# Patient Record
Sex: Female | Born: 1940
Health system: Southern US, Community
[De-identification: ages and names within clinical notes are randomized; demographics above are authoritative.]

## PROBLEM LIST (undated history)

## (undated) DIAGNOSIS — E039 Hypothyroidism, unspecified: Secondary | ICD-10-CM

## (undated) DIAGNOSIS — K219 Gastro-esophageal reflux disease without esophagitis: Secondary | ICD-10-CM

## (undated) DIAGNOSIS — I10 Essential (primary) hypertension: Secondary | ICD-10-CM

## (undated) DIAGNOSIS — E119 Type 2 diabetes mellitus without complications: Secondary | ICD-10-CM

## (undated) DIAGNOSIS — F410 Panic disorder [episodic paroxysmal anxiety] without agoraphobia: Secondary | ICD-10-CM

## (undated) DIAGNOSIS — J189 Pneumonia, unspecified organism: Secondary | ICD-10-CM

## (undated) DIAGNOSIS — T8859XA Other complications of anesthesia, initial encounter: Secondary | ICD-10-CM

## (undated) DIAGNOSIS — I2789 Other specified pulmonary heart diseases: Secondary | ICD-10-CM

## (undated) DIAGNOSIS — E785 Hyperlipidemia, unspecified: Secondary | ICD-10-CM

## (undated) DIAGNOSIS — Z9889 Other specified postprocedural states: Secondary | ICD-10-CM

## (undated) DIAGNOSIS — U071 COVID-19: Secondary | ICD-10-CM

## (undated) DIAGNOSIS — I517 Cardiomegaly: Secondary | ICD-10-CM

## (undated) DIAGNOSIS — R011 Cardiac murmur, unspecified: Secondary | ICD-10-CM

## (undated) DIAGNOSIS — R0600 Dyspnea, unspecified: Secondary | ICD-10-CM

## (undated) DIAGNOSIS — R112 Nausea with vomiting, unspecified: Secondary | ICD-10-CM

## (undated) DIAGNOSIS — I2510 Atherosclerotic heart disease of native coronary artery without angina pectoris: Secondary | ICD-10-CM

## (undated) DIAGNOSIS — K635 Polyp of colon: Secondary | ICD-10-CM

## (undated) DIAGNOSIS — Z87442 Personal history of urinary calculi: Secondary | ICD-10-CM

## (undated) DIAGNOSIS — D649 Anemia, unspecified: Secondary | ICD-10-CM

## (undated) DIAGNOSIS — N289 Disorder of kidney and ureter, unspecified: Secondary | ICD-10-CM

## (undated) DIAGNOSIS — C50919 Malignant neoplasm of unspecified site of unspecified female breast: Secondary | ICD-10-CM

## (undated) DIAGNOSIS — T4145XA Adverse effect of unspecified anesthetic, initial encounter: Secondary | ICD-10-CM

## (undated) DIAGNOSIS — D0510 Intraductal carcinoma in situ of unspecified breast: Secondary | ICD-10-CM

## (undated) HISTORY — PX: MASTECTOMY: SHX3

## (undated) HISTORY — PX: OTHER SURGICAL HISTORY: SHX169

## (undated) HISTORY — DX: Other specified pulmonary heart diseases: I27.89

## (undated) HISTORY — DX: Essential (primary) hypertension: I10

## (undated) HISTORY — DX: Cardiomegaly: I51.7

## (undated) HISTORY — DX: Intraductal carcinoma in situ of unspecified breast: D05.10

## (undated) HISTORY — DX: Atherosclerotic heart disease of native coronary artery without angina pectoris: I25.10

## (undated) HISTORY — DX: Hyperlipidemia, unspecified: E78.5

## (undated) HISTORY — DX: Disorder of kidney and ureter, unspecified: N28.9

## (undated) HISTORY — PX: ABDOMINAL HYSTERECTOMY: SHX81

## (undated) HISTORY — PX: CHOLECYSTECTOMY: SHX55

---

## 1987-05-18 DIAGNOSIS — C50919 Malignant neoplasm of unspecified site of unspecified female breast: Secondary | ICD-10-CM

## 1987-05-18 HISTORY — DX: Malignant neoplasm of unspecified site of unspecified female breast: C50.919

## 2004-03-16 ENCOUNTER — Ambulatory Visit: Payer: Self-pay | Admitting: Internal Medicine

## 2006-11-25 ENCOUNTER — Ambulatory Visit: Payer: Self-pay | Admitting: Cardiovascular Disease

## 2006-11-26 ENCOUNTER — Emergency Department: Payer: Self-pay | Admitting: Emergency Medicine

## 2008-03-01 ENCOUNTER — Ambulatory Visit: Payer: Self-pay | Admitting: Cardiology

## 2008-04-25 ENCOUNTER — Ambulatory Visit: Payer: Self-pay | Admitting: Obstetrics and Gynecology

## 2008-05-02 ENCOUNTER — Ambulatory Visit: Payer: Self-pay | Admitting: Obstetrics and Gynecology

## 2008-06-08 ENCOUNTER — Ambulatory Visit: Payer: Self-pay | Admitting: Gastroenterology

## 2008-06-08 ENCOUNTER — Inpatient Hospital Stay (HOSPITAL_COMMUNITY): Admission: EM | Admit: 2008-06-08 | Discharge: 2008-06-12 | Payer: Self-pay | Admitting: Emergency Medicine

## 2008-06-11 ENCOUNTER — Encounter (INDEPENDENT_AMBULATORY_CARE_PROVIDER_SITE_OTHER): Payer: Self-pay | Admitting: *Deleted

## 2009-02-05 ENCOUNTER — Ambulatory Visit: Payer: Self-pay | Admitting: Gastroenterology

## 2009-03-05 DIAGNOSIS — I2510 Atherosclerotic heart disease of native coronary artery without angina pectoris: Secondary | ICD-10-CM | POA: Insufficient documentation

## 2009-03-05 DIAGNOSIS — I2789 Other specified pulmonary heart diseases: Secondary | ICD-10-CM | POA: Insufficient documentation

## 2009-03-05 DIAGNOSIS — I10 Essential (primary) hypertension: Secondary | ICD-10-CM | POA: Insufficient documentation

## 2009-03-05 DIAGNOSIS — I517 Cardiomegaly: Secondary | ICD-10-CM | POA: Insufficient documentation

## 2009-03-05 DIAGNOSIS — E785 Hyperlipidemia, unspecified: Secondary | ICD-10-CM | POA: Insufficient documentation

## 2010-08-31 LAB — POCT I-STAT, CHEM 8
BUN: 13 mg/dL (ref 6–23)
Calcium, Ion: 1.03 mmol/L — ABNORMAL LOW (ref 1.12–1.32)
Chloride: 107 mEq/L (ref 96–112)
Creatinine, Ser: 0.7 mg/dL (ref 0.4–1.2)
Glucose, Bld: 163 mg/dL — ABNORMAL HIGH (ref 70–99)
HCT: 38 % (ref 36.0–46.0)
Hemoglobin: 12.9 g/dL (ref 12.0–15.0)
Potassium: 3.4 mEq/L — ABNORMAL LOW (ref 3.5–5.1)
Sodium: 143 mEq/L (ref 135–145)
TCO2: 26 mmol/L (ref 0–100)

## 2010-08-31 LAB — HEPATIC FUNCTION PANEL
ALT: 226 U/L — ABNORMAL HIGH (ref 0–35)
ALT: 427 U/L — ABNORMAL HIGH (ref 0–35)
AST: 106 U/L — ABNORMAL HIGH (ref 0–37)
AST: 637 U/L — ABNORMAL HIGH (ref 0–37)
Albumin: 3.2 g/dL — ABNORMAL LOW (ref 3.5–5.2)
Albumin: 3.5 g/dL (ref 3.5–5.2)
Alkaline Phosphatase: 116 U/L (ref 39–117)
Alkaline Phosphatase: 121 U/L — ABNORMAL HIGH (ref 39–117)
Bilirubin, Direct: 0.5 mg/dL — ABNORMAL HIGH (ref 0.0–0.3)
Bilirubin, Direct: 0.8 mg/dL — ABNORMAL HIGH (ref 0.0–0.3)
Indirect Bilirubin: 0.8 mg/dL (ref 0.3–0.9)
Indirect Bilirubin: 1 mg/dL — ABNORMAL HIGH (ref 0.3–0.9)
Total Bilirubin: 1.3 mg/dL — ABNORMAL HIGH (ref 0.3–1.2)
Total Bilirubin: 1.8 mg/dL — ABNORMAL HIGH (ref 0.3–1.2)
Total Protein: 6.1 g/dL (ref 6.0–8.3)
Total Protein: 6.4 g/dL (ref 6.0–8.3)

## 2010-08-31 LAB — CBC
HCT: 34.6 % — ABNORMAL LOW (ref 36.0–46.0)
HCT: 38.1 % (ref 36.0–46.0)
HCT: 38.2 % (ref 36.0–46.0)
Hemoglobin: 11.5 g/dL — ABNORMAL LOW (ref 12.0–15.0)
Hemoglobin: 12.5 g/dL (ref 12.0–15.0)
Hemoglobin: 12.6 g/dL (ref 12.0–15.0)
MCHC: 32.8 g/dL (ref 30.0–36.0)
MCHC: 33.1 g/dL (ref 30.0–36.0)
MCHC: 33.3 g/dL (ref 30.0–36.0)
MCV: 89.5 fL (ref 78.0–100.0)
MCV: 90.4 fL (ref 78.0–100.0)
MCV: 90.5 fL (ref 78.0–100.0)
Platelets: 147 10*3/uL — ABNORMAL LOW (ref 150–400)
Platelets: 175 10*3/uL (ref 150–400)
Platelets: 191 10*3/uL (ref 150–400)
RBC: 3.86 MIL/uL — ABNORMAL LOW (ref 3.87–5.11)
RBC: 4.21 MIL/uL (ref 3.87–5.11)
RBC: 4.22 MIL/uL (ref 3.87–5.11)
RDW: 14.6 % (ref 11.5–15.5)
RDW: 14.9 % (ref 11.5–15.5)
RDW: 14.9 % (ref 11.5–15.5)
WBC: 4.2 10*3/uL (ref 4.0–10.5)
WBC: 6.7 10*3/uL (ref 4.0–10.5)
WBC: 7.1 10*3/uL (ref 4.0–10.5)

## 2010-08-31 LAB — COMPREHENSIVE METABOLIC PANEL
ALT: 194 U/L — ABNORMAL HIGH (ref 0–35)
ALT: 355 U/L — ABNORMAL HIGH (ref 0–35)
ALT: 83 U/L — ABNORMAL HIGH (ref 0–35)
AST: 136 U/L — ABNORMAL HIGH (ref 0–37)
AST: 230 U/L — ABNORMAL HIGH (ref 0–37)
AST: 274 U/L — ABNORMAL HIGH (ref 0–37)
Albumin: 3.1 g/dL — ABNORMAL LOW (ref 3.5–5.2)
Albumin: 3.2 g/dL — ABNORMAL LOW (ref 3.5–5.2)
Albumin: 3.9 g/dL (ref 3.5–5.2)
Alkaline Phosphatase: 116 U/L (ref 39–117)
Alkaline Phosphatase: 129 U/L — ABNORMAL HIGH (ref 39–117)
Alkaline Phosphatase: 135 U/L — ABNORMAL HIGH (ref 39–117)
BUN: 12 mg/dL (ref 6–23)
BUN: 3 mg/dL — ABNORMAL LOW (ref 6–23)
BUN: 4 mg/dL — ABNORMAL LOW (ref 6–23)
CO2: 25 mEq/L (ref 19–32)
CO2: 26 mEq/L (ref 19–32)
CO2: 28 mEq/L (ref 19–32)
Calcium: 8.7 mg/dL (ref 8.4–10.5)
Calcium: 9 mg/dL (ref 8.4–10.5)
Calcium: 9.2 mg/dL (ref 8.4–10.5)
Chloride: 105 mEq/L (ref 96–112)
Chloride: 107 mEq/L (ref 96–112)
Chloride: 107 mEq/L (ref 96–112)
Creatinine, Ser: 0.61 mg/dL (ref 0.4–1.2)
Creatinine, Ser: 0.65 mg/dL (ref 0.4–1.2)
Creatinine, Ser: 0.75 mg/dL (ref 0.4–1.2)
GFR calc Af Amer: >60 mL/min (ref >60)
GFR calc Af Amer: >60 mL/min (ref >60)
GFR calc Af Amer: >60 mL/min (ref >60)
GFR calc non Af Amer: >60 mL/min (ref >60)
GFR calc non Af Amer: >60 mL/min (ref >60)
GFR calc non Af Amer: >60 mL/min (ref >60)
Glucose, Bld: 128 mg/dL — ABNORMAL HIGH (ref 70–99)
Glucose, Bld: 141 mg/dL — ABNORMAL HIGH (ref 70–99)
Glucose, Bld: 171 mg/dL — ABNORMAL HIGH (ref 70–99)
Potassium: 3.4 mEq/L — ABNORMAL LOW (ref 3.5–5.1)
Potassium: 3.9 mEq/L (ref 3.5–5.1)
Potassium: 4.2 mEq/L (ref 3.5–5.1)
Sodium: 140 mEq/L (ref 135–145)
Sodium: 140 mEq/L (ref 135–145)
Sodium: 143 mEq/L (ref 135–145)
Total Bilirubin: 1.3 mg/dL — ABNORMAL HIGH (ref 0.3–1.2)
Total Bilirubin: 1.4 mg/dL — ABNORMAL HIGH (ref 0.3–1.2)
Total Bilirubin: 1.5 mg/dL — ABNORMAL HIGH (ref 0.3–1.2)
Total Protein: 5.9 g/dL — ABNORMAL LOW (ref 6.0–8.3)
Total Protein: 6 g/dL (ref 6.0–8.3)
Total Protein: 6.7 g/dL (ref 6.0–8.3)

## 2010-08-31 LAB — POCT CARDIAC MARKERS
CKMB, poc: <1 ng/mL — ABNORMAL LOW (ref 1.0–8.0)
Myoglobin, poc: 39.6 ng/mL (ref 12–200)
Troponin i, poc: <0.05 ng/mL (ref 0.00–0.09)

## 2010-08-31 LAB — URINALYSIS, ROUTINE W REFLEX MICROSCOPIC
Glucose, UA: NEGATIVE mg/dL
Ketones, ur: NEGATIVE mg/dL
Nitrite: NEGATIVE
Protein, ur: NEGATIVE mg/dL
Specific Gravity, Urine: 1.019 (ref 1.005–1.030)
Urobilinogen, UA: >8 mg/dL — ABNORMAL HIGH (ref 0.0–1.0)
pH: 8 (ref 5.0–8.0)

## 2010-08-31 LAB — CK TOTAL AND CKMB (NOT AT ARMC)
CK, MB: 0.6 ng/mL (ref 0.3–4.0)
Relative Index: INVALID (ref 0.0–2.5)
Total CK: 39 U/L (ref 7–177)

## 2010-08-31 LAB — PROTIME-INR
INR: 1 (ref 0.00–1.49)
Prothrombin Time: 13.6 seconds (ref 11.6–15.2)

## 2010-08-31 LAB — URINE MICROSCOPIC-ADD ON

## 2010-08-31 LAB — APTT: aPTT: 28 seconds (ref 24–37)

## 2010-08-31 LAB — TROPONIN I: Troponin I: 0.02 ng/mL (ref 0.00–0.06)

## 2010-08-31 LAB — LIPASE, BLOOD
Lipase: 15 U/L (ref 11–59)
Lipase: 24 U/L (ref 11–59)

## 2010-08-31 LAB — MAGNESIUM: Magnesium: 1.9 mg/dL (ref 1.5–2.5)

## 2010-09-29 NOTE — Discharge Summary (Signed)
Tammy Cannon, Tammy Cannon NO.:  [id number]   MEDICAL RECORD NO.:  [id number]          PATIENT TYPE:  INP   LOCATION:  5153                         FACILITY:  [facility]   PHYSICIAN:  Alfonse Ras, MD   DATE OF BIRTH:  09-Sep-1940   DATE OF ADMISSION:  06/08/2008  DATE OF DISCHARGE:  06/12/2008                               DISCHARGE SUMMARY   CONSULTANTS:  Rachael Fee, MD with [facility].   CHIEF COMPLAINT AND REASON FOR ADMISSION:  [name] is a 71 year old  female patient who has had right upper quadrant epigastric and right  chest pain radiating to the back, onset for several weeks, episode at 9  on the evening of admission, worst episode ever, was continuous and  unrelenting, so she presented to the ER.  She did not have any nausea,  vomiting, fever, or chills.  In the ER, she was afebrile.  Vital signs  were stable.  Her abdomen was tender in the right upper quadrant, but  mildly.  An ultrasound demonstrated gallbladder sludge and gallbladder  wall thickening 4-5 mm.  UA was normal.  Cardiac enzymes were normal.  Lipase 24.  White cell count 7100.  Transaminases were mildly elevated  at 230 and 83  but the total bilirubin of 1.4.  The patient was admitted  by Dr. Dwain Sarna with the following diagnoses:  1. Mild transaminitis and gallbladder sludge, question      choledocholithiasis.  2. Biliary colic, question subacute cholecystitis.   HOSPITAL COURSE:  The patient was admitted and placed on the general  floor for monitoring IV fluids and serial labs and control of pain.  She  was started on DVT prophylaxis with subcu heparin and symptoms were  treated with Protonix, morphine, and Zofran.   By the morning of first day of admission, the patient's transaminases  had increased further.  So, the patient was placed empirically on IV  Unasyn and an MRCP was ordered.  Also, there was some concern the  patient may have obstructive choledocholithiasis, so GI  consult was  obtained.  Dr. Christella Hartigan evaluated the patient.  At this point, the  patient's AST had increased to 637, ALT 427, alkaline phosphatase 121,  and total bilirubin 1.8.  Pancreatic isoenzymes have remained normal.  MRCP subsequently showed no evidence of choledocholithiasis.  The  patient's LFTs continued to trend downward and by June 10, 2008, it  was felt that the patient could probably proceed with a laparoscopic  cholecystectomy in the next 24 hours.   On June 11, 2008, the patient was taken to the OR by Dr. Colin Benton where  she underwent laparoscopic cholecystectomy with a normal intraoperative  cholangiogram for a diagnosis of cholecystitis, biliary colic, and  biliary sludge.  The patient tolerated the procedure well and was sent  back to the general floor to recover.  On postop day #1, the patient's  vital signs were stable.  She was afebrile.  She was tolerating clear  liquids.  Upon initial morning rounds, she had not been started on oral  pain medications because she  had not advanced her diet yet.  She was  complaining of no BMs since admission but has history of constipation at  home.  She was also complaining of continued right epigastrium and right  upper quadrant pain, although her incisional pain was much greater.  Plans at this point were to advance the patient's diet, began on oral  pain medications once diet advanced to full liquids, began IV Toradol  with plans to transition over to ibuprofen after discharge.  The patient  reports she has tolerated this well in the past as she takes with food  or snack and uses a proton pump inhibitor which she is already on.  Because of her continued pain, it was opted to check CMET, CBC, and  lipase before discharge.  If the patient tolerates all the above i.e.  diet advance, pain medications orally, and no abnormalities and labs,  she will be deemed appropriate for discharge home after 3 p.m. today.  Again, her abdomen  exam was essentially benign.  Abdomen is soft with  expected tenderness over the incisions.  The incisions are clean, dry,  and intact and approximated with Steri-Strips.  She has some very mild  tenderness in the epigastrium in right upper quadrant.   FINAL DISCHARGE DIAGNOSES:  1. Abdominal pain secondary to biliary colic and acute cholecystitis.  2. Biliary sludge with recent transaminitis, improving.  3. Status post laparoscopic cholecystectomy with normal intraoperative      cholangiogram.  4. History of chronic constipation.  5. History of hypothyroidism.  6. Dyslipidemia, on statin drug.   DISCHARGE MEDICATIONS:  The patient will resume the following  medications:  1. Protonix 40 mg daily.  2. Levothyroxine 80 mcg daily.  3. Pravastatin 20 mg daily.  4. Tarka 240 mg tablet status daily.   NEW MEDICATIONS:  1. Percocet 5/325 one to two tablets every 4 hours as needed for pain.  2. Ibuprofen 600 mg t.i.d. p.r.n. pain, may take in addition to      Percocet, always take with food or snack.  3. Over-the-counter Colace, Docusate sodium b.i.d. to prevent      constipation.  Stop if loose stools or diarrhea develops.   ACTIVITY:  1. Please refer to the home care instructions for laparoscopic      cholecystectomy provided by [facility].  Again, no      lifting greater than 10 pounds for 2 weeks.  No driving for 1 week.      Okay to shower.  Take bandages off 24 hours after discharge.  2. Call physician if you have redness, drainage, swelling, or new      puncture wounds, temperature greater than 101 degrees Fahrenheit or      severe pain unrelieved by aspirin products or prescribed pain      medicines.   FOLLOWUP:  At the __________ [facility] on February 9,  2:00 p.m.  Arrive at 1:45 p.m. for paperwork, telephone [phone].       Allison L. Rolene Course      Alfonse Ras, MD  Electronically Signed    ALE/MEDQ  D:  06/12/2008  T:   06/12/2008  Job:  [id number]   cc:   Rachael Fee, MD

## 2010-09-29 NOTE — Op Note (Signed)
[name], [name]                 ACCOUNT NO.:  [id number]   MEDICAL RECORD NO.:  [id number]          PATIENT TYPE:  INP   LOCATION:  5153                         FACILITY:  [facility]   PHYSICIAN:  Alfonse Ras, MD   DATE OF BIRTH:  [birth year]   DATE OF PROCEDURE:  06/11/2008  DATE OF DISCHARGE:                               OPERATIVE REPORT   PREOPERATIVE DIAGNOSIS:  Acute cholecystitis.   POSTOPERATIVE DIAGNOSIS:  Acute cholecystitis.   PROCEDURE:  Laparoscopic cholecystectomy with intraoperative  cholangiogram.   FINDINGS:  Normal cholangiogram with some filling defect initially in  the distal common bile duct but cleared with additional contrast and  evidence of acute cholecystitis.   SURGEON:  Alfonse Ras, MD   ASSISTANT:  Lazaro Arms, PA   DESCRIPTION:  After informed consent was granted by the patient, the  patient was taken to the operating room and placed in a supine position.  After adequate general anesthesia was induced using endotracheal tube,  the abdomen was prepped and draped in a normal sterile fashion.  Using a  transverse infraumbilical incision, I dissected down the fascia.  The  fascia was opened vertically.  An 0 Vicryl pursestring suture was placed  on the fascial defect.  Hasson trocar was placed in the abdomen and the  abdomen was insufflated with continuous flow of carbon dioxide.  Pneumoperitoneum was obtained and under direct vision, an 11-mm trocar  was placed in the subxiphoid region, two 5-mm trocars were placed in the  right abdomen under direct vision.  The gallbladder which was  intrahepatic and quite edematous was identified and retracted cephalad.  The neck of the gallbladder was identified and critical view was  obtained of the cystic duct.  It was clipped proximally on the  gallbladder.  A hole was made in the sidewall of the gallbladder because  of the edema and there was a significant amount of sludge extracted.  A  ductotomy  was made and cholangiogram was performed with a Reddick  catheter.  Initially, this showed a small filling defect in the distal  common bile duct but I was able to clear that with additional contrast.  It showed free flow into the right and left hepatic ducts and then into  the duodenum.  The cystic duct was then clipped and divided after the  cholangiocatheter was removed.  Cystic artery was identified, critical  view was obtained, triply clipped and divided.  Gallbladder was taken  off the gallbladder bed using Bovie electrocautery, placed in an  Endocatch bag and removed through the umbilical port.  Adequate  hemostasis was ensured.  Surgicel packing was placed in the gallbladder  fossa.  The gallbladder was removed again through the umbilical port and  pneumoperitoneum was released.  Infraumbilical fascial defect was closed with a 0 Vicryl pursestring  suture.  Skin incisions were closed with subcuticular 4-0 Monocryl and  injected with 0.5 Marcaine.  Steri-Strips and sterile dressings were  applied.  The patient tolerated the procedure well and went to PACU in  good condition.  Alfonse Ras, MD  Electronically Signed     KRE/MEDQ  D:  06/11/2008  T:  06/12/2008  Job:  [id number]

## 2010-09-29 NOTE — Assessment & Plan Note (Signed)
Tammy Cannon OFFICE NOTE   NAME:Tammy Cannon                          MRN:          [id number]  DATE:03/01/2008                            DOB:          [birth year]    HISTORY:  [name] is a delightful 70 year old married white  female, sister of a patient of mine named Tammy Cannon, who comes today  referred by Dr. Gelene Mink for consultation concerning her coronary  disease.   Last year, she was having some chest discomfort.  She underwent a series  of tests including a stress Myoview which showed reduced exercise  tolerance, EF 72%, 1.5 mm of ST-segment depression in V4 through V6, and  no ischemia by perfusion scan.  She subsequently underwent a cardiac  catheterization by Dr. Welton Flakes which showed nonobstructive coronary  disease.  Her EF was 55%.  Renal vessels were angiographically normal.  Her left and right lower extremity vessels were said to be normal.   She also had a 2-D echocardiogram which was normal except for mild LVH.  She also has some mild mitral regurgitation with a mild elevation of  right ventricular systolic pressure at 40 with mild TR.  She had carotid  Dopplers which showed no significant stenosis bilaterally with antegrade  flow in both vertebrals.   Her complaint today is that she has pinpoint discomfort, but is not  exertion related in her neck.  He does not radiate.  She has no  exertional angina.  She has had no syncope, presyncope, tachy  palpitations, orthopnea, or PND.   PAST MEDICAL HISTORY:   ALLERGIES:  She has no known drug allergies.   MEDICATIONS:  She is currently on pantoprazole 40 mg a day, Dulcolax one  on Saturday only, levothyroxine 88 mcg a day, Tarka 2/240 daily,  pravastatin 20 daily, zolpidem 5 mg nightly.  She is not taking aspirin.   Her cardiac risk factors include hypertension, age, and hyperlipidemia.   SURGICAL HISTORY:  Breast cancer years  ago.   FAMILY HISTORY:  Positive for coronary disease in a sister and a  brother.   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  She stands on her  feet a lot.   SOCIAL HISTORY:  She is married and has one child.   PHYSICAL EXAMINATION:  VITAL SIGNS:  Today, blood pressure was 185/100.  Please note she did not take her blood pressure medicine last night when  she normally takes it.  Pulse is 60 and regular.  Weight is 134.1.  She  is 5 feet 3 inches.  GENERAL:  She is a well-developed, well-nourished white female in no  acute distress.  HEENT:  Normal.  NECK:  Carotids are full without bruits.  There is no thyromegaly.  There is no point tenderness.  No lymphadenopathy.  Thyroid is not  enlarged.  Trachea is midline.  Neck is supple.  LUNGS:  Clear to auscultation percussion.  HEART:  Reveals a nondisplaced PMI.  Normal S1 and S2.  No gallop, rub,  or murmur.  ABDOMEN:  Soft.  No pulsatile mass.  No tenderness.  EXTREMITIES:  No cyanosis, clubbing, or edema.  Pulses are present  bilaterally.  There is no sign of DVT.  NEUROLOGIC:  Intact.  SKIN:  Unremarkable.   EKG is normal.   ASSESSMENT:  1. Nonobstructive coronary disease.  2. Hypertension, question control.  She does not check this at home      and unfortunately did not take her medicines last night.  Office      notes from Dr. Timoteo Gaul showed blood pressures that have been fairly      well controlled on several checks there, so it makes me feel a lot      better.  3. Hyperlipidemia.  4. Left ventricular hypertrophy.  5. Mild pulmonary hypertension.   PLAN:  I have reviewed secondary preventative strategies with Tammy Cannon.  At the present time, I do not think her symptoms are angina or coronary.  I will see her back in a year.  I have advised her to take an enteric-  coated aspirin 81 mg a day.     Thomas C. Daleen Squibb, MD, [facility]  Electronically Signed    TCW/MedQ  DD: 03/01/2008  DT: 03/01/2008  Job #:  [id number]   cc:   Galen Daft. Timoteo Gaul, MD

## 2010-09-29 NOTE — H&P (Signed)
Tammy Cannon, Tammy Cannon                 ACCOUNT NO.:  [id number]   MEDICAL RECORD NO.:  [id number]          PATIENT TYPE:  INP   LOCATION:  5121                         FACILITY:  [facility]   PHYSICIAN:  Juanetta Gosling, MDDATE OF BIRTH:  11-27-1940   DATE OF ADMISSION:  06/08/2008  DATE OF DISCHARGE:                              HISTORY & PHYSICAL   CHIEF COMPLAINT:  Right upper quadrant, epigastric pain.   HISTORY OF PRESENT ILLNESS:  This is a 70 year old female with a history  of right upper quadrant, epigastric, and right chest pain that radiates  to her back.  This happened several times over the last few weeks.  She  had an episode tonight started at 9 that was the worst episode yet that  has not abated at this point and had caused her to come to the emergency  room.  Denies nausea or vomiting.  Denies any fever or chills.  She had  an evaluation for this possible chest pain previously with an  essentially normal cardiac catheterization.  I have reviewed her  Cardiology notes in the chart.  When I evaluated her, she still is  complaining of some pain in her epigastrium.   PAST MEDICAL HISTORY:  Significant for breast cancer in 1989,  hypercholesterolemia, hypothyroidism, and hypertension.   PAST SURGICAL HISTORY:  For breast surgery and as well as a total  vaginal hysterectomy a month ago, which she tolerated well.   FAMILY HISTORY:  Significant for coronary artery disease.   SOCIAL HISTORY:  She is a nonsmoker and nondrinker.   ALLERGIES:  No known drug allergies.   MEDICATIONS:  She does not remember all the names of the medicines, but  I reviewed these from the chart, from which she was seen in October  2009.  They include;  1. Synthroid 88 mcg per day.  2. Protonix 40 a day.  3. Dulcolax on Saturday.  4. Pravachol 20 daily.  5. Ambien 5 mg nightly.   REVIEW OF SYSTEMS:  Otherwise, negative.   PHYSICAL EXAMINATION:  VITAL SIGNS:  98.1, 63, 18, and 137/82.  GENERAL:   She is a well-appearing female in no distress.  HEENT:  Within normal limits.  NECK:  Supple with adenopathy.  LUNGS:  Clear bilaterally.  HEART:  Regular rate and rhythm.  ABDOMEN:  Mild right upper quadrant epigastric tenderness.  EXTREMITIES:  Showed no edema.   Her ultrasound shows gallbladder sludge and wall thickening 4-5 mm.  Her  urinalysis is negative.  Troponin is normal.  Lipase is 24.  White blood  cell count 7.1, transaminases were mildly elevated at 230 and 83, with a  total bilirubin of 1.4.   ASSESSMENT:  Choledocholithiasis  Plan for admission, recheck LFTS in am, GI consult, will need lap chole  this admission.  No clinical evidence of cholecystitis.      Juanetta Gosling, MD  Electronically Signed     MCW/MEDQ  D:  06/08/2008  T:  06/08/2008  Job:  [id number]

## 2010-10-30 ENCOUNTER — Encounter: Payer: Self-pay | Admitting: Cardiovascular Disease

## 2011-05-06 ENCOUNTER — Other Ambulatory Visit: Payer: Self-pay | Admitting: Dermatology

## 2014-05-03 ENCOUNTER — Ambulatory Visit: Payer: Self-pay | Admitting: Gastroenterology

## 2014-05-24 DIAGNOSIS — K219 Gastro-esophageal reflux disease without esophagitis: Secondary | ICD-10-CM | POA: Diagnosis not present

## 2014-05-24 DIAGNOSIS — C50919 Malignant neoplasm of unspecified site of unspecified female breast: Secondary | ICD-10-CM | POA: Diagnosis not present

## 2014-05-24 DIAGNOSIS — I517 Cardiomegaly: Secondary | ICD-10-CM | POA: Diagnosis not present

## 2014-08-08 DIAGNOSIS — D225 Melanocytic nevi of trunk: Secondary | ICD-10-CM | POA: Diagnosis not present

## 2014-08-08 DIAGNOSIS — L570 Actinic keratosis: Secondary | ICD-10-CM | POA: Diagnosis not present

## 2014-08-08 DIAGNOSIS — L909 Atrophic disorder of skin, unspecified: Secondary | ICD-10-CM | POA: Diagnosis not present

## 2014-08-08 DIAGNOSIS — D1801 Hemangioma of skin and subcutaneous tissue: Secondary | ICD-10-CM | POA: Diagnosis not present

## 2014-08-08 DIAGNOSIS — L821 Other seborrheic keratosis: Secondary | ICD-10-CM | POA: Diagnosis not present

## 2014-08-23 DIAGNOSIS — F419 Anxiety disorder, unspecified: Secondary | ICD-10-CM | POA: Diagnosis not present

## 2014-08-23 DIAGNOSIS — R7989 Other specified abnormal findings of blood chemistry: Secondary | ICD-10-CM | POA: Diagnosis not present

## 2014-08-23 DIAGNOSIS — E039 Hypothyroidism, unspecified: Secondary | ICD-10-CM | POA: Diagnosis not present

## 2014-08-23 DIAGNOSIS — R5383 Other fatigue: Secondary | ICD-10-CM | POA: Diagnosis not present

## 2014-08-23 DIAGNOSIS — K5900 Constipation, unspecified: Secondary | ICD-10-CM | POA: Diagnosis not present

## 2014-08-23 DIAGNOSIS — E780 Pure hypercholesterolemia: Secondary | ICD-10-CM | POA: Diagnosis not present

## 2014-08-23 DIAGNOSIS — E559 Vitamin D deficiency, unspecified: Secondary | ICD-10-CM | POA: Diagnosis not present

## 2014-08-23 DIAGNOSIS — E785 Hyperlipidemia, unspecified: Secondary | ICD-10-CM | POA: Diagnosis not present

## 2014-09-09 LAB — SURGICAL PATHOLOGY

## 2014-11-22 DIAGNOSIS — E559 Vitamin D deficiency, unspecified: Secondary | ICD-10-CM | POA: Diagnosis not present

## 2014-11-22 DIAGNOSIS — E785 Hyperlipidemia, unspecified: Secondary | ICD-10-CM | POA: Diagnosis not present

## 2014-11-22 DIAGNOSIS — R5383 Other fatigue: Secondary | ICD-10-CM | POA: Diagnosis not present

## 2014-11-22 DIAGNOSIS — E039 Hypothyroidism, unspecified: Secondary | ICD-10-CM | POA: Diagnosis not present

## 2014-11-22 DIAGNOSIS — E780 Pure hypercholesterolemia: Secondary | ICD-10-CM | POA: Diagnosis not present

## 2014-11-22 DIAGNOSIS — K219 Gastro-esophageal reflux disease without esophagitis: Secondary | ICD-10-CM | POA: Diagnosis not present

## 2014-11-22 DIAGNOSIS — E119 Type 2 diabetes mellitus without complications: Secondary | ICD-10-CM | POA: Diagnosis not present

## 2014-11-22 DIAGNOSIS — K5900 Constipation, unspecified: Secondary | ICD-10-CM | POA: Diagnosis not present

## 2015-02-21 DIAGNOSIS — F419 Anxiety disorder, unspecified: Secondary | ICD-10-CM | POA: Diagnosis not present

## 2015-02-21 DIAGNOSIS — I10 Essential (primary) hypertension: Secondary | ICD-10-CM | POA: Diagnosis not present

## 2015-02-21 DIAGNOSIS — K219 Gastro-esophageal reflux disease without esophagitis: Secondary | ICD-10-CM | POA: Diagnosis not present

## 2015-02-21 DIAGNOSIS — E785 Hyperlipidemia, unspecified: Secondary | ICD-10-CM | POA: Diagnosis not present

## 2015-03-27 DIAGNOSIS — M25551 Pain in right hip: Secondary | ICD-10-CM | POA: Diagnosis not present

## 2015-03-27 DIAGNOSIS — M545 Low back pain: Secondary | ICD-10-CM | POA: Diagnosis not present

## 2015-03-28 DIAGNOSIS — C50919 Malignant neoplasm of unspecified site of unspecified female breast: Secondary | ICD-10-CM | POA: Diagnosis not present

## 2015-03-28 DIAGNOSIS — M5136 Other intervertebral disc degeneration, lumbar region: Secondary | ICD-10-CM | POA: Diagnosis not present

## 2015-03-28 DIAGNOSIS — M8588 Other specified disorders of bone density and structure, other site: Secondary | ICD-10-CM | POA: Diagnosis not present

## 2015-03-28 DIAGNOSIS — Z853 Personal history of malignant neoplasm of breast: Secondary | ICD-10-CM | POA: Diagnosis not present

## 2015-03-28 DIAGNOSIS — M2578 Osteophyte, vertebrae: Secondary | ICD-10-CM | POA: Diagnosis not present

## 2015-03-28 DIAGNOSIS — M160 Bilateral primary osteoarthritis of hip: Secondary | ICD-10-CM | POA: Diagnosis not present

## 2015-03-28 DIAGNOSIS — M545 Low back pain: Secondary | ICD-10-CM | POA: Diagnosis not present

## 2015-04-03 DIAGNOSIS — M25551 Pain in right hip: Secondary | ICD-10-CM | POA: Diagnosis not present

## 2015-07-03 DIAGNOSIS — E559 Vitamin D deficiency, unspecified: Secondary | ICD-10-CM | POA: Diagnosis not present

## 2015-07-03 DIAGNOSIS — R5383 Other fatigue: Secondary | ICD-10-CM | POA: Diagnosis not present

## 2015-07-03 DIAGNOSIS — E7800 Pure hypercholesterolemia, unspecified: Secondary | ICD-10-CM | POA: Diagnosis not present

## 2015-07-04 DIAGNOSIS — Z79891 Long term (current) use of opiate analgesic: Secondary | ICD-10-CM | POA: Diagnosis not present

## 2015-08-19 DIAGNOSIS — B349 Viral infection, unspecified: Secondary | ICD-10-CM | POA: Diagnosis not present

## 2015-08-19 DIAGNOSIS — R509 Fever, unspecified: Secondary | ICD-10-CM | POA: Diagnosis not present

## 2015-08-19 DIAGNOSIS — R05 Cough: Secondary | ICD-10-CM | POA: Diagnosis not present

## 2015-08-19 DIAGNOSIS — R5383 Other fatigue: Secondary | ICD-10-CM | POA: Diagnosis not present

## 2015-08-30 DIAGNOSIS — J209 Acute bronchitis, unspecified: Secondary | ICD-10-CM | POA: Diagnosis not present

## 2015-10-02 DIAGNOSIS — I517 Cardiomegaly: Secondary | ICD-10-CM | POA: Diagnosis not present

## 2015-10-02 DIAGNOSIS — K219 Gastro-esophageal reflux disease without esophagitis: Secondary | ICD-10-CM | POA: Diagnosis not present

## 2015-10-02 DIAGNOSIS — E039 Hypothyroidism, unspecified: Secondary | ICD-10-CM | POA: Diagnosis not present

## 2015-10-02 DIAGNOSIS — I10 Essential (primary) hypertension: Secondary | ICD-10-CM | POA: Diagnosis not present

## 2015-11-05 DIAGNOSIS — I340 Nonrheumatic mitral (valve) insufficiency: Secondary | ICD-10-CM | POA: Diagnosis not present

## 2015-11-05 DIAGNOSIS — G450 Vertebro-basilar artery syndrome: Secondary | ICD-10-CM | POA: Diagnosis not present

## 2015-11-05 DIAGNOSIS — I10 Essential (primary) hypertension: Secondary | ICD-10-CM | POA: Diagnosis not present

## 2015-11-05 DIAGNOSIS — I361 Nonrheumatic tricuspid (valve) insufficiency: Secondary | ICD-10-CM | POA: Diagnosis not present

## 2016-03-03 ENCOUNTER — Emergency Department: Payer: Worker's Compensation

## 2016-03-03 ENCOUNTER — Emergency Department
Admission: EM | Admit: 2016-03-03 | Discharge: 2016-03-03 | Disposition: A | Discharge status: Home / Self Care | Payer: Worker's Compensation | Attending: Student in an Organized Health Care Education/Training Program | Admitting: Student in an Organized Health Care Education/Training Program

## 2016-03-03 ENCOUNTER — Encounter: Payer: Self-pay | Admitting: Emergency Medicine

## 2016-03-03 DIAGNOSIS — Y990 Civilian activity done for income or pay: Secondary | ICD-10-CM | POA: Insufficient documentation

## 2016-03-03 DIAGNOSIS — Y929 Unspecified place or not applicable: Secondary | ICD-10-CM | POA: Insufficient documentation

## 2016-03-03 DIAGNOSIS — I2510 Atherosclerotic heart disease of native coronary artery without angina pectoris: Secondary | ICD-10-CM | POA: Insufficient documentation

## 2016-03-03 DIAGNOSIS — W07XXXA Fall from chair, initial encounter: Secondary | ICD-10-CM | POA: Insufficient documentation

## 2016-03-03 DIAGNOSIS — I10 Essential (primary) hypertension: Secondary | ICD-10-CM | POA: Diagnosis not present

## 2016-03-03 DIAGNOSIS — S0990XA Unspecified injury of head, initial encounter: Secondary | ICD-10-CM | POA: Diagnosis present

## 2016-03-03 DIAGNOSIS — S0101XA Laceration without foreign body of scalp, initial encounter: Secondary | ICD-10-CM

## 2016-03-03 DIAGNOSIS — Y9389 Activity, other specified: Secondary | ICD-10-CM | POA: Insufficient documentation

## 2016-03-03 MED ORDER — TRAMADOL HCL 50 MG PO TABS
50.0000 mg | ORAL_TABLET | Freq: Once | ORAL | Status: AC
  Administered 2016-03-03: 50 mg via ORAL
  Filled 2016-03-03: qty 1

## 2016-03-03 MED ORDER — TRAMADOL HCL 50 MG PO TABS: 50.0000 mg | ORAL_TABLET | Freq: Two times a day (BID) | ORAL | 0 refills | Status: DC | PRN

## 2016-03-03 MED ORDER — LIDOCAINE HCL (PF) 1 % IJ SOLN
INTRAMUSCULAR | Status: AC
  Filled 2016-03-03: qty 5

## 2016-03-03 NOTE — ED Notes (Addendum)
Pt filing workmans comp now. Ed Tech, Crystal at bedside to complete UDS and breath analysis required by employer per profile.

## 2016-03-03 NOTE — ED Notes (Signed)
Laceration to posterior scalp, happened when patient slipped out of chair today while at work and fell to ground. Denies LOC. Pt does not take blood thinners. Pt able to ambulate. Pt alert and oriented X4, active, cooperative, pt in NAD. RR even and unlabored, color WNL.  Pt states that she does not know if she wants to file workers comp or not.

## 2016-03-03 NOTE — ED Notes (Signed)
Pt completed urine drug and breath analysis per w/c profile; urine specimen hand delivered to [facility] lab for carrier pick up; pt given her and employers copies to take employer

## 2016-03-03 NOTE — ED Provider Notes (Signed)
[facility] Emergency Department Provider Note   ____________________________________________   None    (approximate)  I have reviewed the triage vital signs and the nursing notes.   HISTORY  Chief Complaint Laceration   HPI Tammy Cannon is a 75 y.o. female presents after falling backward out of a chair that slipped out from under her at work this morning and hitting her head on a metal machine behind her causing a head laceration on the back of her head.  She denies LOC, vomiting, nausea, visual changes, chest pain, or SOB.  She admits to a frontal band-like headache that is a 10/10 intensity.  She denies being on blood thinners.  Her daughter brought her into the ED.    Past Medical History:  Diagnosis Date  . Cardiomegaly   . Coronary atherosclerosis of native coronary artery   . Other and unspecified hyperlipidemia   . Other chronic pulmonary heart diseases   . Unspecified essential hypertension     Patient Active Problem List   Diagnosis Date Noted  . HYPERLIPIDEMIA-MIXED 03/05/2009  . HYPERTENSION, UNSPECIFIED 03/05/2009  . CAD, NATIVE VESSEL 03/05/2009  . PULMONARY HYPERTENSION 03/05/2009  . VENTRICULAR HYPERTROPHY, LEFT 03/05/2009    Past Surgical History:  Procedure Laterality Date  . lumpectomy (otheR)      Prior to Admission medications   Medication Sig Start Date End Date Taking? Authorizing Provider  traMADol (ULTRAM) 50 MG tablet Take 1 tablet (50 mg total) by mouth every 12 (twelve) hours as needed for moderate pain. 03/03/16   Sable Feil, PA-C    Allergies Review of patient's allergies indicates no known allergies.  No family history on file.  Social History Social History  Substance Use Topics  . Smoking status: Never Smoker  . Smokeless tobacco: Never Used  . Alcohol use No    Review of Systems Constitutional: No fever/chills Eyes: No visual changes. ENT: No sore throat. Cardiovascular: Denies chest  pain. Respiratory: Denies shortness of breath. Gastrointestinal: No abdominal pain.  No nausea, no vomiting.  No diarrhea.  No constipation. Genitourinary: Negative for dysuria. Musculoskeletal: Negative for back pain. Skin: Negative for rash. Neurological: Negative focal weakness or numbness. Positive for headache 10-point ROS otherwise negative.  ____________________________________________   PHYSICAL EXAM:  VITAL SIGNS: ED Triage Vitals [03/03/16 0944]  Enc Vitals Group     BP (!) 181/71     Pulse Rate 77     Resp 16     Temp 97.9 F (36.6 C)     Temp Source Oral     SpO2 98 %     Weight 134 lb (60.8 kg)     Height 5\' 3"  (1.6 m)     Head Circumference      Peak Flow      Pain Score 9     Pain Loc      Pain Edu?      Excl. in [place]?     Constitutional: Alert and oriented. Well appearing and in no acute distress. Eyes: Conjunctivae are normal. PERRL. EOMI. Head: about 1 cm L-shaped laceration and a puncture like laceration in the right occipital region of the head.  Nose: No congestion/rhinnorhea. Mouth/Throat: Mucous membranes are moist.  Oropharynx non-erythematous. Neck: No stridor.   Cardiovascular: Normal rate, regular rhythm. Grossly normal heart sounds.  Good peripheral circulation. Respiratory: Normal respiratory effort.  No retractions. Lungs CTAB. Gastrointestinal: Soft and nontender. No distention. No abdominal bruits. No CVA tenderness. Musculoskeletal: No lower extremity  tenderness nor edema.  No joint effusions. UE muscle strength 5/5. LE muscle strength 5/5. DTR 2+ BIL Neurologic:  Normal speech and language. No gross focal neurologic deficits are appreciated. No gait instability. Cranial nerves 2-12 intact. Normal sensation BIL.  Skin:  Skin is warm, dry and intact. No rash noted.0.5 cm laceration occipital area of the scalp. Psychiatric: Mood and affect are normal. Speech and behavior are normal.  ____________________________________________    LABS (all labs ordered are listed, but only abnormal results are displayed)  Labs Reviewed - No data to display  ____________________________________________  RADIOLOGY  No acute findings of CT of the head. ____________________________________________   PROCEDURES  Procedure(s) performed: Procedures laceration of the occipital region of the head   Critical Care performed: No  ____LACERATION REPAIR Performed by: Doristine Counter PA-S Authorized by: Sable Feil Consent: Verbal consent obtained. Risks and benefits: risks, benefits and alternatives were discussed Consent given by: patient Patient identity confirmed: provided demographic data Prepped and Draped in normal sterile fashion Wound explored  Laceration Location: Right occipital region of the head  Laceration Length: 0.5 cm  No Foreign Bodies seen or palpated  Anesthesia: local infiltration  Local anesthetic: lidocaine 1% without epinephrine  Anesthetic total: 3 ml  Irrigation method: syringe Amount of cleaning: standard  Skin closure: 2 staples were placed in the L-shaped laceration on the occipital region of the head  Number of staples: 2  Technique: none  Patient tolerance: Patient tolerated the procedure well with no immediate complications. ________________________________________   INITIAL IMPRESSION / ASSESSMENT AND PLAN / ED COURSE  Pertinent labs & imaging results that were available during my care of the patient were reviewed by me and considered in my medical decision making (see chart for details).  Scalp laceration. Patient given discharge care instructions. Patient advised return back in 10 days for staple removal  Clinical Course     ____________________________________________   FINAL CLINICAL IMPRESSION(S) / ED DIAGNOSES  Final diagnoses:  Scalp laceration, initial encounter      NEW MEDICATIONS STARTED DURING THIS VISIT:  Discharge Medication List as of  03/03/2016 12:03 PM    START taking these medications   Details  traMADol (ULTRAM) 50 MG tablet Take 1 tablet (50 mg total) by mouth every 12 (twelve) hours as needed for moderate pain., Starting Wed 03/03/2016, Print         Note:  This document was prepared using Dragon voice recognition software and may include unintentional dictation errors.   Sable Feil, PA-C 03/09/16 [address], MD 03/11/16 [phone]

## 2016-03-03 NOTE — ED Triage Notes (Addendum)
Reports sitting on a chair with wheels and it rolled out from her.  Lac to back of head.  Denies LOC or blood thinners.  A&O, MAE, skin w/d with good color. Lac to back of head, bleeding controlled.

## 2016-03-03 NOTE — ED Notes (Signed)
Pt alert and oriented X4, active, cooperative, pt in NAD. RR even and unlabored, color WNL.  Pt informed to return if any life threatening symptoms occur.   

## 2016-03-15 DIAGNOSIS — Z4802 Encounter for removal of sutures: Secondary | ICD-10-CM | POA: Diagnosis not present

## 2016-03-15 DIAGNOSIS — S0191XD Laceration without foreign body of unspecified part of head, subsequent encounter: Secondary | ICD-10-CM | POA: Diagnosis not present

## 2016-05-13 DIAGNOSIS — R5383 Other fatigue: Secondary | ICD-10-CM | POA: Diagnosis not present

## 2016-05-13 DIAGNOSIS — E785 Hyperlipidemia, unspecified: Secondary | ICD-10-CM | POA: Diagnosis not present

## 2016-05-13 DIAGNOSIS — I10 Essential (primary) hypertension: Secondary | ICD-10-CM | POA: Diagnosis not present

## 2016-05-13 DIAGNOSIS — E119 Type 2 diabetes mellitus without complications: Secondary | ICD-10-CM | POA: Diagnosis not present

## 2016-05-13 DIAGNOSIS — E559 Vitamin D deficiency, unspecified: Secondary | ICD-10-CM | POA: Diagnosis not present

## 2016-05-13 DIAGNOSIS — E039 Hypothyroidism, unspecified: Secondary | ICD-10-CM | POA: Diagnosis not present

## 2016-06-29 ENCOUNTER — Other Ambulatory Visit: Payer: Self-pay | Admitting: Family Medicine

## 2016-06-29 DIAGNOSIS — N6320 Unspecified lump in the left breast, unspecified quadrant: Principal | ICD-10-CM

## 2016-06-29 DIAGNOSIS — N6325 Unspecified lump in the left breast, overlapping quadrants: Secondary | ICD-10-CM

## 2016-07-03 ENCOUNTER — Emergency Department: Payer: Managed Care, Other (non HMO)

## 2016-07-03 ENCOUNTER — Emergency Department
Admission: EM | Admit: 2016-07-03 | Discharge: 2016-07-03 | Disposition: A | Discharge status: Home / Self Care | Payer: Managed Care, Other (non HMO) | Attending: Emergency Medicine | Admitting: Emergency Medicine

## 2016-07-03 ENCOUNTER — Encounter: Payer: Self-pay | Admitting: Emergency Medicine

## 2016-07-03 DIAGNOSIS — I2510 Atherosclerotic heart disease of native coronary artery without angina pectoris: Secondary | ICD-10-CM | POA: Diagnosis not present

## 2016-07-03 DIAGNOSIS — I119 Hypertensive heart disease without heart failure: Secondary | ICD-10-CM | POA: Insufficient documentation

## 2016-07-03 DIAGNOSIS — R42 Dizziness and giddiness: Secondary | ICD-10-CM | POA: Insufficient documentation

## 2016-07-03 DIAGNOSIS — E119 Type 2 diabetes mellitus without complications: Secondary | ICD-10-CM | POA: Insufficient documentation

## 2016-07-03 DIAGNOSIS — S0191XA Laceration without foreign body of unspecified part of head, initial encounter: Secondary | ICD-10-CM | POA: Diagnosis not present

## 2016-07-03 HISTORY — DX: Type 2 diabetes mellitus without complications: E11.9

## 2016-07-03 LAB — BASIC METABOLIC PANEL
Anion gap: 9 (ref 5–15)
BUN: 24 mg/dL — ABNORMAL HIGH (ref 6–20)
CO2: 27 mmol/L (ref 22–32)
Calcium: 9.4 mg/dL (ref 8.9–10.3)
Chloride: 106 mmol/L (ref 101–111)
Creatinine, Ser: 1.03 mg/dL — ABNORMAL HIGH (ref 0.44–1.00)
GFR calc Af Amer: >60 mL/min (ref >60)
GFR calc non Af Amer: 52 mL/min — ABNORMAL LOW (ref >60)
Glucose, Bld: 134 mg/dL — ABNORMAL HIGH (ref 65–99)
Potassium: 3.7 mmol/L (ref 3.5–5.1)
Sodium: 142 mmol/L (ref 135–145)

## 2016-07-03 LAB — CBC
HCT: 37.2 % (ref 35.0–47.0)
Hemoglobin: 12.6 g/dL (ref 12.0–16.0)
MCH: 30 pg (ref 26.0–34.0)
MCHC: 33.8 g/dL (ref 32.0–36.0)
MCV: 89 fL (ref 80.0–100.0)
Platelets: 180 10*3/uL (ref 150–440)
RBC: 4.18 MIL/uL (ref 3.80–5.20)
RDW: 13.8 % (ref 11.5–14.5)
WBC: 5.7 10*3/uL (ref 3.6–11.0)

## 2016-07-03 MED ORDER — MECLIZINE HCL 25 MG PO TABS: 12.5000 mg | ORAL_TABLET | Freq: Three times a day (TID) | ORAL | 0 refills | Status: DC | PRN

## 2016-07-03 MED ORDER — MECLIZINE HCL 12.5 MG PO TABS
12.5000 mg | ORAL_TABLET | ORAL | Status: AC
  Administered 2016-07-03: 12.5 mg via ORAL
  Filled 2016-07-03: qty 1

## 2016-07-03 MED ORDER — MECLIZINE HCL 25 MG PO TABS
ORAL_TABLET | ORAL | Status: AC
  Administered 2016-07-03: 12.5 mg via ORAL
  Filled 2016-07-03: qty 1

## 2016-07-03 NOTE — ED Triage Notes (Addendum)
Pt states after she woke up this morning she started getting dizzy. Pt states she also had a headache which she states went away. Pt states she gets dizzy every once in a while and states today when she stands up she gets dizzy.

## 2016-07-03 NOTE — ED Notes (Signed)
Patient transported to MRI 

## 2016-07-03 NOTE — ED Provider Notes (Signed)
[facility] Emergency Department Provider Note   ____________________________________________   First MD Initiated Contact with Patient 07/03/16 1703     (approximate)  I have reviewed the triage vital signs and the nursing notes.   HISTORY  Chief Complaint Dizziness    HPI Tammy Cannon is a 76 y.o. female reports feeling dizzy since this morning. She get up this morning, and when she got up out of bed she started feeling "dizzy" with trouble with her balance, feeling like she was spinning, and improved when she laid back down, but got worse in when she tried to stand up. Throughout the day she is occasionally felt slightly dizzy, and feels like her balance is a little bit off.  The patient and her daughter both reports she's had inner ear problems in the past, similar but today seemed worse.  She has some trouble with balance all the time, but today's dizziness seemed worse than normal  No nausea or vomiting. No weakness in the face, no weakness in grip, no weakness in an arm or leg, no loss of sensation or trouble speaking.  Denies previous history of stroke. She is a type II diabetic  No chest pain or shortness of breath. No neck pain.  She did have a mild headache this morning, reports she gets frequent headaches in the back of her scalp since falling several months ago while at work, and this headache she had today was not unusual. Went away after taking her motrin  Past Medical History:  Diagnosis Date  . Cardiomegaly   . Coronary atherosclerosis of native coronary artery   . Diabetes mellitus without complication ([place])   . Other and unspecified hyperlipidemia   . Other chronic pulmonary heart diseases   . Unspecified essential hypertension     Patient Active Problem List   Diagnosis Date Noted  . HYPERLIPIDEMIA-MIXED 03/05/2009  . HYPERTENSION, UNSPECIFIED 03/05/2009  . CAD, NATIVE VESSEL 03/05/2009  . PULMONARY HYPERTENSION 03/05/2009   . VENTRICULAR HYPERTROPHY, LEFT 03/05/2009    Past Surgical History:  Procedure Laterality Date  . lumpectomy (otheR)      Prior to Admission medications   Medication Sig Start Date End Date Taking? Authorizing Provider  meclizine (ANTIVERT) 25 MG tablet Take 0.5 tablets (12.5 mg total) by mouth 3 (three) times daily as needed for dizziness. 07/03/16   Delman Kitten, MD  traMADol (ULTRAM) 50 MG tablet Take 1 tablet (50 mg total) by mouth every 12 (twelve) hours as needed for moderate pain. 03/03/16   Sable Feil, PA-C    Allergies Patient has no known allergies.  History reviewed. No pertinent family history.  Social History Social History  Substance Use Topics  . Smoking status: Never Smoker  . Smokeless tobacco: Never Used  . Alcohol use No    Review of Systems Constitutional: No fever/chills Eyes: No visual changes. ENT: No sore throat. Cardiovascular: Denies chest pain. Respiratory: Denies shortness of breath. Gastrointestinal: No abdominal pain.  No nausea, no vomiting.  No diarrhea.  No constipation. Genitourinary: Negative for dysuria. Musculoskeletal: Negative for back pain. Skin: Negative for rash. Neurological: Negative for  focal weakness or numbness.  10-point ROS otherwise negative.  ____________________________________________   PHYSICAL EXAM:  VITAL SIGNS: ED Triage Vitals  Enc Vitals Group     BP 07/03/16 1630 (!) 179/76     Pulse Rate 07/03/16 1630 72     Resp 07/03/16 1630 20     Temp 07/03/16 1630 97.5 F (36.4  C)     Temp Source 07/03/16 1630 Oral     SpO2 07/03/16 1630 98 %     Weight 07/03/16 1631 136 lb (61.7 kg)     Height 07/03/16 1631 5\' 3"  (1.6 m)     Head Circumference --      Peak Flow --      Pain Score 07/03/16 1644 3     Pain Loc --      Pain Edu? --      Excl. in [place]? --     Constitutional: Alert and oriented. Well appearing and in no acute distress. Eyes: Conjunctivae are normal. PERRL. EOMI. Head:  Atraumatic. Nose: No congestion/rhinnorhea. Mouth/Throat: Mucous membranes are moist.  Oropharynx non-erythematous. Neck: No stridor.   Cardiovascular: Normal rate, regular rhythm. Grossly normal heart sounds.  Good peripheral circulation. Respiratory: Normal respiratory effort.  No retractions. Lungs CTAB. Gastrointestinal: Soft and nontender. No distention. No abdominal bruits. No CVA tenderness. Musculoskeletal: No lower extremity tenderness nor edema.  No joint effusions. Neurologic:  Normal speech and language. No gross focal neurologic deficits are appreciated. No gait instability. I personally assisted the patient ambulate without difficulty.  The patient has no pronator drift. The patient has normal cranial nerve exam. Extraocular movements are normal. Patient has 5 out of 5 strength in all extremities. There is no numbness or gross, acute sensory abnormality in the extremities bilaterally. No speech disturbance. No dysarthria. No aphasia. Patient speaking in full and clear sentences.   Skin:  Skin is warm, dry and intact. No rash noted. Psychiatric: Mood and affect are normal. Speech and behavior are normal.  ____________________________________________   LABS (all labs ordered are listed, but only abnormal results are displayed)  Labs Reviewed  BASIC METABOLIC PANEL - Abnormal; Notable for the following:       Result Value   Glucose, Bld 134 (*)    BUN 24 (*)    Creatinine, Ser 1.03 (*)    GFR calc non Af Amer 52 (*)    All other components within normal limits  CBC  URINALYSIS, COMPLETE (UACMP) WITH MICROSCOPIC  CBG MONITORING, ED   ____________________________________________  EKG  ED ECG REPORT I, Connor Foxworthy, the attending physician, personally viewed and interpreted this ECG.  Date: 07/03/2016 EKG Time: 1625 Rate: 70 Rhythm: normal sinus rhythm QRS Axis: normal Intervals: normal ST/T Wave abnormalities: normal Conduction Disturbances:  none Narrative Interpretation: unremarkable  ____________________________________________  RADIOLOGY  Mr Brain Wo Contrast  Result Date: 07/03/2016 CLINICAL DATA:  76 year old female with dizziness. [place] backwards out of chair at work this morning striking head on metal machine. Laceration. Headache. Initial encounter. EXAM: MRI HEAD WITHOUT CONTRAST TECHNIQUE: Multiplanar, multiecho pulse sequences of the brain and surrounding structures were obtained without intravenous contrast. COMPARISON:  Head CT without contrast 03/03/2016. FINDINGS: Brain: Cerebral volume is within normal limits for age. No restricted diffusion to suggest acute infarction. No midline shift, mass effect, evidence of mass lesion, ventriculomegaly, extra-axial collection or acute intracranial hemorrhage. Cervicomedullary junction and pituitary are within normal limits. [place] Cables and white matter signal is within normal limits for age throughout the brain. No cortical encephalomalacia or chronic cerebral blood products. Vascular: Major intracranial vascular flow voids are preserved. Skull and upper cervical spine: Incidental occipital arachnoid granulations re- demonstrated (series 7, image 7). Normal bone marrow signal. Negative visualized cervical spine. Sinuses/Orbits: Postoperative changes to both globes, otherwise normal orbits soft tissues. Visualized paranasal sinuses and mastoids are stable and well pneumatized. Other: Grossly normal visible internal  auditory structures. No scalp hematoma or other scalp soft tissue abnormality is identified. IMPRESSION: Normal for age noncontrast MRI appearance of the brain. Electronically Signed   By: Genevie Ann M.D.   On: 07/03/2016 19:56    ____________________________________________   PROCEDURES  Procedure(s) performed: None  Procedures  Critical Care performed: No  ____________________________________________   INITIAL IMPRESSION / ASSESSMENT AND PLAN / ED COURSE  Pertinent labs  & imaging results that were available during my care of the patient were reviewed by me and considered in my medical decision making (see chart for details).  Patient presents for dizziness, symptoms sound positional and likely vertigo in nature. Reassuring neurologic examination.  I discussed with the patient and her family, primary concerns are probably in her ear dysfunction such as vertigo, or less likely a possible small stroke. Discussed risks and benefits of CT scan versus MRI, patient has elected to have an MRI of the brain. Lab work and EKG reassuring. If MRI is normal, discharged home with diagnosis it appears to be consistent with vertigo.    ----------------------------------------- 8:33 PM on 07/03/2016 -----------------------------------------  Patient resting comfortable. Discussed diagnosis which appears to be "vertigo". MRI normal. Recommended against driving until she is sure her symptoms have improved, and be very cautious. 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  Patient and daughter are agreeable. Daughter driving her home.  Return precautions and treatment recommendations and follow-up discussed with the patient who is agreeable with the plan.   ____________________________________________   FINAL CLINICAL IMPRESSION(S) / ED DIAGNOSES  Final diagnoses:  Vertigo      NEW MEDICATIONS STARTED DURING THIS VISIT:  New Prescriptions   MECLIZINE (ANTIVERT) 25 MG TABLET    Take 0.5 tablets (12.5 mg total) by mouth 3 (three) times daily as needed for dizziness.     Note:  This document was prepared using Dragon voice recognition software and may include unintentional dictation errors.     Delman Kitten, MD 07/03/16 2033

## 2016-07-03 NOTE — ED Notes (Signed)
Pt brought back to room 30 in a w/c and ambulated to the bed. States was dizzy this am and went to urgent care but was never seen d/t the 2 hr wait.

## 2016-07-03 NOTE — Discharge Instructions (Signed)
We believe your symptoms were caused by benign vertigo.  Please read through the included information and take any prescribed medication(s).  Follow up with your doctor as listed above.  If you develop any new or worsening symptoms that concern you, including but not limited to persistent dizziness/vertigo, numbness or weakness in your arms or legs, altered mental status, persistent vomiting, or fever greater than 101, please return immediately to the Emergency Department.  

## 2016-07-09 ENCOUNTER — Other Ambulatory Visit: Payer: Self-pay | Admitting: *Deleted

## 2016-07-09 ENCOUNTER — Inpatient Hospital Stay
Admission: RE | Admit: 2016-07-09 | Discharge: 2016-07-09 | Disposition: A | Discharge status: Home / Self Care | Payer: Self-pay | Source: Ambulatory Visit | Attending: *Deleted | Admitting: *Deleted

## 2016-07-09 DIAGNOSIS — Z9289 Personal history of other medical treatment: Secondary | ICD-10-CM

## 2016-07-20 ENCOUNTER — Other Ambulatory Visit: Payer: Self-pay | Admitting: Family Medicine

## 2016-07-20 DIAGNOSIS — N6320 Unspecified lump in the left breast, unspecified quadrant: Principal | ICD-10-CM

## 2016-07-20 DIAGNOSIS — N6325 Unspecified lump in the left breast, overlapping quadrants: Secondary | ICD-10-CM

## 2016-08-03 ENCOUNTER — Ambulatory Visit
Admission: RE | Admit: 2016-08-03 | Discharge: 2016-08-03 | Disposition: A | Discharge status: Home / Self Care | Payer: Managed Care, Other (non HMO) | Source: Ambulatory Visit | Attending: Family Medicine | Admitting: Family Medicine

## 2016-08-03 DIAGNOSIS — N6320 Unspecified lump in the left breast, unspecified quadrant: Secondary | ICD-10-CM | POA: Diagnosis present

## 2016-08-03 DIAGNOSIS — N6322 Unspecified lump in the left breast, upper inner quadrant: Secondary | ICD-10-CM | POA: Insufficient documentation

## 2016-08-03 DIAGNOSIS — R928 Other abnormal and inconclusive findings on diagnostic imaging of breast: Secondary | ICD-10-CM | POA: Diagnosis not present

## 2016-08-03 DIAGNOSIS — N6489 Other specified disorders of breast: Secondary | ICD-10-CM | POA: Diagnosis not present

## 2016-08-03 DIAGNOSIS — N6325 Unspecified lump in the left breast, overlapping quadrants: Secondary | ICD-10-CM

## 2016-08-03 HISTORY — DX: Malignant neoplasm of unspecified site of unspecified female breast: C50.919

## 2016-08-06 ENCOUNTER — Other Ambulatory Visit: Payer: Self-pay | Admitting: Family Medicine

## 2016-08-06 DIAGNOSIS — R928 Other abnormal and inconclusive findings on diagnostic imaging of breast: Secondary | ICD-10-CM

## 2016-08-06 DIAGNOSIS — N6489 Other specified disorders of breast: Secondary | ICD-10-CM | POA: Diagnosis not present

## 2016-08-19 ENCOUNTER — Ambulatory Visit
Admission: RE | Admit: 2016-08-19 | Discharge: 2016-08-19 | Disposition: A | Discharge status: Home / Self Care | Payer: Managed Care, Other (non HMO) | Source: Ambulatory Visit | Attending: Family Medicine | Admitting: Family Medicine

## 2016-08-19 DIAGNOSIS — R928 Other abnormal and inconclusive findings on diagnostic imaging of breast: Secondary | ICD-10-CM

## 2016-08-19 DIAGNOSIS — D0512 Intraductal carcinoma in situ of left breast: Secondary | ICD-10-CM | POA: Diagnosis not present

## 2016-08-19 DIAGNOSIS — D0510 Intraductal carcinoma in situ of unspecified breast: Secondary | ICD-10-CM

## 2016-08-19 DIAGNOSIS — N6489 Other specified disorders of breast: Secondary | ICD-10-CM | POA: Diagnosis not present

## 2016-08-19 HISTORY — PX: BREAST BIOPSY: SHX20

## 2016-08-19 HISTORY — DX: Intraductal carcinoma in situ of unspecified breast: D05.10

## 2016-08-24 DIAGNOSIS — D0592 Unspecified type of carcinoma in situ of left breast: Secondary | ICD-10-CM | POA: Diagnosis not present

## 2016-08-25 ENCOUNTER — Encounter: Payer: Self-pay | Admitting: *Deleted

## 2016-08-26 ENCOUNTER — Encounter: Payer: Self-pay | Admitting: *Deleted

## 2016-08-26 ENCOUNTER — Ambulatory Visit (INDEPENDENT_AMBULATORY_CARE_PROVIDER_SITE_OTHER): Payer: Managed Care, Other (non HMO) | Admitting: General Surgery

## 2016-08-26 VITALS — BP 142/76 | HR 68 | Resp 12 | Ht 63.0 in | Wt 133.0 lb

## 2016-08-26 DIAGNOSIS — N6489 Other specified disorders of breast: Secondary | ICD-10-CM | POA: Diagnosis not present

## 2016-08-26 DIAGNOSIS — D0512 Intraductal carcinoma in situ of left breast: Secondary | ICD-10-CM | POA: Diagnosis not present

## 2016-08-26 NOTE — Progress Notes (Signed)
Patient ID: Tammy Cannon, female   DOB: [birth year], 76 y.o.   MRN: [id number]  Chief Complaint  Patient presents with  . Breast Problem    HPI Tammy Cannon is a 76 y.o. female.  who presents for a breast evaluation referred by Dr Laural Benes. The most recent mammogram and left breast biopsy was done on 08-19-16 that showed DCIS.  Patient does perform regular self breast checks and gets regular mammograms done. She did miss last years mammogram. History of right mastectomy in 1989. She felt a knot lower left breast, about 2-3 months ago that went away.  She works at [employer] in [place]. She is here today with her daughter, [name].  HPI  Past Medical History:  Diagnosis Date  . Breast cancer [facility]) 1989   Right mastectomy /pos node [name] /Dr Cerame  . Cardiomegaly   . Coronary atherosclerosis of native coronary artery   . DCIS (ductal carcinoma in situ) 08/19/2016   left breast  . Diabetes mellitus without complication ([place])   . Other and unspecified hyperlipidemia   . Other chronic pulmonary heart diseases   . Unspecified essential hypertension     Past Surgical History:  Procedure Laterality Date  . ABDOMINAL HYSTERECTOMY    . BREAST BIOPSY Left 08/19/2016   DUCTAL CARCINOMA IN SITU (DCIS) INVOLVING A SCLEROSING LESION  . CHOLECYSTECTOMY    . lumpectomy (otheR)    . MASTECTOMY Right     Family History  Problem Relation Age of Onset  . Breast cancer Sister     Social History Social History  Substance Use Topics  . Smoking status: Never Smoker  . Smokeless tobacco: Never Used  . Alcohol use No    No Known Allergies  Current Outpatient Prescriptions  Medication Sig Dispense Refill  . levothyroxine (SYNTHROID, LEVOTHROID) 88 MCG tablet Take 88 mcg by mouth daily before breakfast.     . pantoprazole (PROTONIX) 40 MG tablet Take 40 mg by mouth daily.     . pravastatin (PRAVACHOL) 40 MG tablet Take 40 mg by mouth daily.     . trandolapril (MAVIK) 2 MG tablet  Take 2 mg by mouth daily.     . traZODone (DESYREL) 50 MG tablet Take 50 mg by mouth at bedtime.     . verapamil (VERELAN PM) 240 MG 24 hr capsule Take 240 mg by mouth at bedtime.      No current facility-administered medications for this visit.     Review of Systems Review of Systems  Constitutional: Negative.   Respiratory: Negative.   Cardiovascular: Negative.     Blood pressure (!) 142/76, pulse 68, resp. rate 12, height 5\' 3"  (1.6 m), weight 133 lb (60.3 kg).  Physical Exam Physical Exam  Constitutional: She is oriented to person, place, and time. She appears well-developed and well-nourished.  HENT:  Mouth/Throat: Oropharynx is clear and moist.  Eyes: Conjunctivae are normal. No scleral icterus.  Neck: Neck supple.  Cardiovascular: Normal rate, regular rhythm and normal heart sounds.   Pulmonary/Chest: Effort normal and breath sounds normal. Left breast exhibits no inverted nipple, no mass, no nipple discharge, no skin change and no tenderness.  Right mastectomy scar clean.   Lymphadenopathy:    She has no cervical adenopathy.  Neurological: She is alert and oriented to person, place, and time.  Skin: Skin is warm and dry.  Psychiatric: Her behavior is normal.    Data Reviewed DIAGNOSIS:  A. BREAST, LEFT UPPER INNER QUADRANT; BIOPSY:  -  DUCTAL CARCINOMA IN SITU (DCIS) INVOLVING A SCLEROSING LESION.   Mammograms of 08/03/2016 as well as subsequent postbiopsy films were reviewed. This was a very subtle finding and the radiology Department should be commended for the early detection.  Assessment    DCIS with associated sclerosing lesion involving the left breast.    Plan    We reviewed options for management including standard therapy: 1) wide excision, radiation therapy and antiestrogen therapy of hormone receptor positive as well as 2) participation in the AFT-25 COMET while which randomizes patients between standard therapy and nonoperative therapy with estrogen  blockade. We spent about an hour reviewing the pros and cons of each pathway, and that participation in a randomized trial might put her right into the standard therapy group.  She's had a contralateral mastectomy, but still seems to express some interest in maintaining her breast at this time.  ER/PR testing had been deferred pending wide excision, but it has been requested so that if the patient is interested in participating in the randomized control trial this piece of information will be available.  The presence of a radial scar/sclerosing lesion, assuming low/intermediate grade DCIS hopefully not preclude her participation in the trial.  She is interested in knowing how long she would be off for, and certainly this would be only a few days if she had wide excision, slightly longer based on her job description were she to have a simple mastectomy.         HPI, Physical Exam, Assessment and Plan have been scribed under the direction and in the presence of [name], MD.  Karie Fetch, RN  I have completed the exam and reviewed the above documentation for accuracy and completeness.  I agree with the above.  Haematologist has been used and any errors in dictation or transcription are unintentional.  [name], M.D., F.A.C.S.  [name] 08/26/2016, 12:07 PM

## 2016-08-26 NOTE — Patient Instructions (Signed)
The patient is aware to call back for any questions or concerns.  

## 2016-08-27 ENCOUNTER — Telehealth: Payer: Self-pay | Admitting: General Surgery

## 2016-08-27 LAB — SURGICAL PATHOLOGY

## 2016-08-27 NOTE — Telephone Encounter (Signed)
I spoke with the patient's daughter, as the patient was as work and not able to come to the phone. The patient is not a candidate to participate in the COMET trial based on her previous contralateral mastectomy.  I recommended that the patient be reevaluated to discuss at least wide excision with possibility of radiation therapy and/or endocrine therapy to follow.  The daughter, Tammy Cannon reports that this time the patient is considering mastectomy for symmetry.  Arrangements have been made for a follow-up appointment on April 17, at 4:45 PM.

## 2016-08-31 ENCOUNTER — Ambulatory Visit (INDEPENDENT_AMBULATORY_CARE_PROVIDER_SITE_OTHER): Payer: Managed Care, Other (non HMO) | Admitting: General Surgery

## 2016-08-31 VITALS — BP 144/78 | HR 81 | Resp 14 | Ht 63.0 in | Wt 134.0 lb

## 2016-08-31 DIAGNOSIS — D0512 Intraductal carcinoma in situ of left breast: Secondary | ICD-10-CM

## 2016-08-31 NOTE — Patient Instructions (Signed)
Patient to be scheduled for left mastectomy.

## 2016-08-31 NOTE — Progress Notes (Signed)
Patient ID: Tammy Cannon, female   DOB: 11/27/1940, 76 y.o.   MRN: [id number]  Chief Complaint  Patient presents with  . Other    HPI Tammy Cannon is a 76 y.o. female here today for breast discussion.  She is here today with her daughter, Tammy Cannon HPI  Past Medical History:  Diagnosis Date  . Breast cancer [facility]) 1989   Right mastectomy /pos node [name] /Dr Cerame  . Cardiomegaly   . Coronary atherosclerosis of native coronary artery   . DCIS (ductal carcinoma in situ) 08/19/2016   left breast  . Diabetes mellitus without complication ([place])   . Other and unspecified hyperlipidemia   . Other chronic pulmonary heart diseases   . Unspecified essential hypertension     Past Surgical History:  Procedure Laterality Date  . ABDOMINAL HYSTERECTOMY    . BREAST BIOPSY Left 08/19/2016   DUCTAL CARCINOMA IN SITU (DCIS) INVOLVING A SCLEROSING LESION  . CHOLECYSTECTOMY    . lumpectomy (otheR)    . MASTECTOMY Right     Family History  Problem Relation Age of Onset  . Breast cancer Sister     Social History Social History  Substance Use Topics  . Smoking status: Never Smoker  . Smokeless tobacco: Never Used  . Alcohol use No    No Known Allergies  Current Outpatient Prescriptions  Medication Sig Dispense Refill  . levothyroxine (SYNTHROID, LEVOTHROID) 88 MCG tablet Take 88 mcg by mouth daily before breakfast.     . pantoprazole (PROTONIX) 40 MG tablet Take 40 mg by mouth daily.     . pravastatin (PRAVACHOL) 40 MG tablet Take 40 mg by mouth daily.     . trandolapril (MAVIK) 2 MG tablet Take 2 mg by mouth daily.     . traZODone (DESYREL) 50 MG tablet Take 50 mg by mouth at bedtime.     . verapamil (VERELAN PM) 240 MG 24 hr capsule Take 240 mg by mouth at bedtime.      No current facility-administered medications for this visit.     Review of Systems Review of Systems  Constitutional: Negative.   Respiratory: Negative.     Blood pressure (!) 144/78, pulse 81,  resp. rate 14, height 5\' 3"  (1.6 m), weight 134 lb (60.8 kg).  Physical Exam Physical Exam  Constitutional: She is oriented to person, place, and time. She appears well-developed and well-nourished.  Cardiovascular: Normal rate and regular rhythm.   Pulmonary/Chest: Effort normal and breath sounds normal.  Neurological: She is alert and oriented to person, place, and time.      Assessment    DCIS left breast.    Plan    We reviewed options for management including breast conservation with postoperative radiation versus simple mastectomy with sentinel node biopsy. The patient has done very well with her contralateral mastectomy. She has modest breast volume and would likely lose a half-1 cup size with conservative treatment. At this time she comfortable with proceeding directly to mastectomy.  The indication for sentinel node biopsy was reviewed, primarily encase the disease was upstaged with reexcision.    Patient to be scheduled for left mastectomy.   HPI, Physical Exam, Assessment and Plan have been scribed under the direction and in the presence of [name], MD.  [name], CMA I have completed the exam and reviewed the above documentation for accuracy and completeness.  I agree with the above.  Haematologist has been used and any errors in dictation or transcription  are unintentional.  [name], M.D., F.A.C.S.   [name] 09/01/2016, 8:44 PM  Patient's surgery has been scheduled for 09-13-16 at [facility].   Dominga Ferry, CMA

## 2016-09-01 ENCOUNTER — Telehealth: Payer: Self-pay | Admitting: *Deleted

## 2016-09-01 NOTE — Telephone Encounter (Signed)
Message left for daughter to call the office.   We need to review surgery date and instructions.

## 2016-09-01 NOTE — Telephone Encounter (Signed)
Patient's daughter called the office back and was notified of 09-13-16 surgery plans and instructions.   She verbalizes understanding.

## 2016-09-06 ENCOUNTER — Encounter
Admission: RE | Admit: 2016-09-06 | Discharge: 2016-09-06 | Disposition: A | Discharge status: Home / Self Care | Payer: Managed Care, Other (non HMO) | Source: Ambulatory Visit | Attending: General Surgery | Admitting: General Surgery

## 2016-09-06 ENCOUNTER — Other Ambulatory Visit: Payer: Self-pay

## 2016-09-06 DIAGNOSIS — Z01818 Encounter for other preprocedural examination: Secondary | ICD-10-CM | POA: Insufficient documentation

## 2016-09-06 HISTORY — DX: Personal history of urinary calculi: Z87.442

## 2016-09-06 HISTORY — DX: Other specified postprocedural states: Z98.890

## 2016-09-06 HISTORY — DX: Nausea with vomiting, unspecified: R11.2

## 2016-09-06 HISTORY — DX: Hypothyroidism, unspecified: E03.9

## 2016-09-06 HISTORY — DX: Gastro-esophageal reflux disease without esophagitis: K21.9

## 2016-09-06 HISTORY — DX: Other complications of anesthesia, initial encounter: T88.59XA

## 2016-09-06 HISTORY — DX: Panic disorder (episodic paroxysmal anxiety): F41.0

## 2016-09-06 HISTORY — DX: Adverse effect of unspecified anesthetic, initial encounter: T41.45XA

## 2016-09-06 LAB — POTASSIUM: Potassium: 4 mmol/L (ref 3.5–5.1)

## 2016-09-06 NOTE — Patient Instructions (Signed)
  Your procedure is scheduled on:09/13/16 Report to Day Surgery. [facility]OND FLOOR To find out your arrival time please call [phone] between 1PM - 3PM on 09/10/16  Remember: Instructions that are not followed completely may result in serious medical risk, up to and including death, or upon the discretion of your surgeon and anesthesiologist your surgery may need to be rescheduled.    X____ 1. Do not eat food or drink liquids after midnight. No gum chewing or hard candies.     ____ 2. No Alcohol for 24 hours before or after surgery.   ____ 3. Do Not Smoke For 24 Hours Prior to Your Surgery.   ____ 4. Bring all medications with you on the day of surgery if instructed.    ___X_ 5. Notify your doctor if there is any change in your medical condition     (cold, fever, infections).       Do not wear jewelry, make-up, hairpins, clips or nail polish.  Do not wear lotions, powders, or perfumes. You may wear deodorant.  Do not shave 48 hours prior to surgery. Men may shave face and neck.  Do not bring valuables to the hospital.    [facility] is not responsible for any belongings or valuables.               Contacts, dentures or bridgework may not be worn into surgery.  Leave your suitcase in the car. After surgery it may be brought to your room.  For patients admitted to the hospital, discharge time is determined by your                treatment team.   Patients discharged the day of surgery will not be allowed to drive home.   Please read over the following fact sheets that you were given:   Surgical Site Infection Prevention   _X___ Take these medicines the morning of surgery with A SIP OF WATER:    1. LEVOTHYROXINE  2. PANTOPRAZOLE AT BEDTIME 09/12/16 AND MORNING OF SURGERY  3.   4.  5.  6.  ____ Fleet Enema (as directed)   __X__ Use CHG Soap as directed  ____ Use inhalers on the day of surgery  __X__ Stop metformin 2 days prior to surgery    ____ Take 1/2 of  usual insulin dose the night before surgery and none on the morning of surgery.   ____ Stop Coumadin/Plavix/aspirin on   ____ Stop Anti-inflammatories on    ____ Stop supplements until after surgery.    ____ Bring C-Pap to the hospital.

## 2016-09-13 ENCOUNTER — Encounter
Admission: RE | Disposition: A | Discharge status: Home / Self Care | Payer: Self-pay | Source: Ambulatory Visit | Attending: General Surgery

## 2016-09-13 ENCOUNTER — Ambulatory Visit
Admission: RE | Admit: 2016-09-13 | Discharge: 2016-09-13 | Disposition: A | Discharge status: Home / Self Care | Payer: Managed Care, Other (non HMO) | Source: Ambulatory Visit | Attending: General Surgery | Admitting: General Surgery

## 2016-09-13 ENCOUNTER — Encounter: Payer: Self-pay | Admitting: Anesthesiology

## 2016-09-13 ENCOUNTER — Ambulatory Visit: Payer: Managed Care, Other (non HMO) | Admitting: Anesthesiology

## 2016-09-13 ENCOUNTER — Encounter: Payer: Self-pay | Admitting: *Deleted

## 2016-09-13 ENCOUNTER — Encounter
Admission: RE | Admit: 2016-09-13 | Discharge: 2016-09-13 | Disposition: A | Discharge status: Home / Self Care | Payer: Managed Care, Other (non HMO) | Source: Ambulatory Visit | Attending: General Surgery | Admitting: General Surgery

## 2016-09-13 DIAGNOSIS — Z79899 Other long term (current) drug therapy: Secondary | ICD-10-CM | POA: Insufficient documentation

## 2016-09-13 DIAGNOSIS — D0512 Intraductal carcinoma in situ of left breast: Secondary | ICD-10-CM

## 2016-09-13 DIAGNOSIS — I119 Hypertensive heart disease without heart failure: Secondary | ICD-10-CM | POA: Insufficient documentation

## 2016-09-13 DIAGNOSIS — E119 Type 2 diabetes mellitus without complications: Secondary | ICD-10-CM | POA: Diagnosis not present

## 2016-09-13 DIAGNOSIS — E039 Hypothyroidism, unspecified: Secondary | ICD-10-CM | POA: Insufficient documentation

## 2016-09-13 DIAGNOSIS — N6489 Other specified disorders of breast: Secondary | ICD-10-CM | POA: Diagnosis not present

## 2016-09-13 DIAGNOSIS — K219 Gastro-esophageal reflux disease without esophagitis: Secondary | ICD-10-CM | POA: Diagnosis not present

## 2016-09-13 DIAGNOSIS — I2510 Atherosclerotic heart disease of native coronary artery without angina pectoris: Secondary | ICD-10-CM | POA: Insufficient documentation

## 2016-09-13 HISTORY — PX: SIMPLE MASTECTOMY WITH AXILLARY SENTINEL NODE BIOPSY: SHX6098

## 2016-09-13 HISTORY — PX: SENTINEL NODE BIOPSY: SHX6608

## 2016-09-13 LAB — GLUCOSE, CAPILLARY
Glucose-Capillary: 125 mg/dL — ABNORMAL HIGH (ref 65–99)
Glucose-Capillary: 117 mg/dL — ABNORMAL HIGH (ref 65–99)

## 2016-09-13 SURGERY — SIMPLE MASTECTOMY
Anesthesia: General | Laterality: Left | Wound class: Clean Contaminated

## 2016-09-13 MED ORDER — LIDOCAINE HCL (CARDIAC) 20 MG/ML IV SOLN
INTRAVENOUS | Status: DC | PRN
  Administered 2016-09-13: 50 mg via INTRAVENOUS

## 2016-09-13 MED ORDER — KETOROLAC TROMETHAMINE 30 MG/ML IJ SOLN
INTRAMUSCULAR | Status: DC | PRN
  Administered 2016-09-13: 15 mg via INTRAVENOUS

## 2016-09-13 MED ORDER — PROPOFOL 10 MG/ML IV BOLUS
INTRAVENOUS | Status: AC
  Filled 2016-09-13: qty 20

## 2016-09-13 MED ORDER — ACETAMINOPHEN 10 MG/ML IV SOLN
INTRAVENOUS | Status: DC | PRN
  Administered 2016-09-13: 1000 mg via INTRAVENOUS

## 2016-09-13 MED ORDER — EPHEDRINE SULFATE 50 MG/ML IJ SOLN
INTRAMUSCULAR | Status: AC
  Filled 2016-09-13: qty 1

## 2016-09-13 MED ORDER — HYDROCODONE-ACETAMINOPHEN 5-325 MG PO TABS
1.0000 | ORAL_TABLET | ORAL | Status: DC | PRN
  Administered 2016-09-13: 1 via ORAL

## 2016-09-13 MED ORDER — EPHEDRINE SULFATE 50 MG/ML IJ SOLN
INTRAMUSCULAR | Status: DC | PRN
  Administered 2016-09-13 (×2): 5 mg via INTRAVENOUS

## 2016-09-13 MED ORDER — FENTANYL CITRATE (PF) 250 MCG/5ML IJ SOLN
INTRAMUSCULAR | Status: AC
  Filled 2016-09-13: qty 5

## 2016-09-13 MED ORDER — ONDANSETRON HCL 4 MG/2ML IJ SOLN
INTRAMUSCULAR | Status: AC
  Filled 2016-09-13: qty 2

## 2016-09-13 MED ORDER — ACETAMINOPHEN 10 MG/ML IV SOLN
INTRAVENOUS | Status: AC
  Filled 2016-09-13: qty 100

## 2016-09-13 MED ORDER — MIDAZOLAM HCL 2 MG/2ML IJ SOLN
INTRAMUSCULAR | Status: DC | PRN
Start: 2016-09-13 — End: 2016-09-13
  Administered 2016-09-13: 1 mg via INTRAVENOUS

## 2016-09-13 MED ORDER — GLYCOPYRROLATE 0.2 MG/ML IJ SOLN
INTRAMUSCULAR | Status: DC | PRN
  Administered 2016-09-13: 0.2 mg via INTRAVENOUS

## 2016-09-13 MED ORDER — SEVOFLURANE IN SOLN
RESPIRATORY_TRACT | Status: AC
  Filled 2016-09-13: qty 250

## 2016-09-13 MED ORDER — ONDANSETRON HCL 4 MG/2ML IJ SOLN
4.0000 mg | Freq: Once | INTRAMUSCULAR | Status: DC | PRN
Start: 2016-09-13 — End: 2016-09-13

## 2016-09-13 MED ORDER — MIDAZOLAM HCL 2 MG/2ML IJ SOLN
INTRAMUSCULAR | Status: AC
  Filled 2016-09-13: qty 2

## 2016-09-13 MED ORDER — HYDROCODONE-ACETAMINOPHEN 5-325 MG PO TABS
ORAL_TABLET | ORAL | Status: DC
Start: 2016-09-13 — End: 2016-09-13
  Filled 2016-09-13: qty 1

## 2016-09-13 MED ORDER — KETOROLAC TROMETHAMINE 30 MG/ML IJ SOLN
INTRAMUSCULAR | Status: AC
  Filled 2016-09-13: qty 1

## 2016-09-13 MED ORDER — TECHNETIUM TC 99M SULFUR COLLOID FILTERED
1.0000 | Freq: Once | INTRAVENOUS | Status: AC | PRN
  Administered 2016-09-13: 0.888 via INTRADERMAL

## 2016-09-13 MED ORDER — METHYLENE BLUE 0.5 % INJ SOLN
INTRAVENOUS | Status: AC
  Filled 2016-09-13: qty 10

## 2016-09-13 MED ORDER — FENTANYL CITRATE (PF) 100 MCG/2ML IJ SOLN: 25.0000 ug | INTRAMUSCULAR | Status: DC | PRN

## 2016-09-13 MED ORDER — ONDANSETRON HCL 4 MG/2ML IJ SOLN
INTRAMUSCULAR | Status: DC | PRN
  Administered 2016-09-13: 4 mg via INTRAVENOUS

## 2016-09-13 MED ORDER — PROPOFOL 10 MG/ML IV BOLUS
INTRAVENOUS | Status: DC | PRN
  Administered 2016-09-13: 130 mg via INTRAVENOUS

## 2016-09-13 MED ORDER — FENTANYL CITRATE (PF) 100 MCG/2ML IJ SOLN
INTRAMUSCULAR | Status: DC | PRN
  Administered 2016-09-13 (×4): 50 ug via INTRAVENOUS

## 2016-09-13 MED ORDER — HYDROCODONE-ACETAMINOPHEN 5-325 MG PO TABS: 1.0000 | ORAL_TABLET | ORAL | 0 refills | Status: DC | PRN

## 2016-09-13 MED ORDER — SODIUM CHLORIDE 0.9 % IV SOLN
INTRAVENOUS | Status: DC
  Administered 2016-09-13: 09:00:00 via INTRAVENOUS

## 2016-09-13 MED ORDER — GLYCOPYRROLATE 0.2 MG/ML IJ SOLN
INTRAMUSCULAR | Status: AC
  Filled 2016-09-13: qty 1

## 2016-09-13 SURGICAL SUPPLY — 58 items
APPLIER CLIP 11 MED OPEN (CLIP)
APPLIER CLIP 13 LRG OPEN (CLIP)
BANDAGE ELASTIC 6 LF NS (GAUZE/BANDAGES/DRESSINGS) ×2 IMPLANT
BLADE SURG 15 STRL SS SAFETY (BLADE) ×2 IMPLANT
BNDG GAUZE 4.5X4.1 6PLY STRL (MISCELLANEOUS) ×2 IMPLANT
BULB RESERV EVAC DRAIN JP 100C (MISCELLANEOUS) IMPLANT
CANISTER SUCT 1200ML W/VALVE (MISCELLANEOUS) ×2 IMPLANT
CHLORAPREP W/TINT 26ML (MISCELLANEOUS) ×2 IMPLANT
CLIP APPLIE 11 MED OPEN (CLIP) IMPLANT
CLIP APPLIE 13 LRG OPEN (CLIP) IMPLANT
CNTNR SPEC 2.5X3XGRAD LEK (MISCELLANEOUS)
CONT SPEC 4OZ STER OR WHT (MISCELLANEOUS)
CONTAINER SPEC 2.5X3XGRAD LEK (MISCELLANEOUS) IMPLANT
COVER PROBE FLX POLY STRL (MISCELLANEOUS) ×2 IMPLANT
DEVICE DUBIN SPECIMEN MAMMOGRA (MISCELLANEOUS) IMPLANT
DRAIN CHANNEL JP 15F RND 16 (MISCELLANEOUS) IMPLANT
DRAPE INCISE 23X17 IOBAN STRL (DRAPES) ×1
DRAPE INCISE IOBAN 23X17 STRL (DRAPES) ×1 IMPLANT
DRAPE LAPAROTOMY TRNSV 106X77 (MISCELLANEOUS) ×2 IMPLANT
DRSG TELFA 3X8 NADH (GAUZE/BANDAGES/DRESSINGS) ×2 IMPLANT
ELECT CAUTERY BLADE TIP 2.5 (TIP) ×2
ELECT REM PT RETURN 9FT ADLT (ELECTROSURGICAL) ×2
ELECTRODE CAUTERY BLDE TIP 2.5 (TIP) ×1 IMPLANT
ELECTRODE REM PT RTRN 9FT ADLT (ELECTROSURGICAL) ×1 IMPLANT
GAUZE FLUFF 18X24 1PLY STRL (GAUZE/BANDAGES/DRESSINGS) ×2 IMPLANT
GAUZE SPONGE 4X4 12PLY STRL (GAUZE/BANDAGES/DRESSINGS) IMPLANT
GLOVE BIO SURGEON STRL SZ7.5 (GLOVE) ×6 IMPLANT
GLOVE INDICATOR 8.0 STRL GRN (GLOVE) ×4 IMPLANT
GOWN STRL REUS W/ TWL LRG LVL3 (GOWN DISPOSABLE) ×2 IMPLANT
GOWN STRL REUS W/TWL LRG LVL3 (GOWN DISPOSABLE) ×2
KIT RM TURNOVER STRD PROC AR (KITS) ×2 IMPLANT
LABEL OR SOLS (LABEL) ×2 IMPLANT
NDL SAFETY 18GX1.5 (NEEDLE) IMPLANT
NDL SAFETY 22GX1.5 (NEEDLE) IMPLANT
NDL SAFETY 25GX1.5 (NEEDLE) ×2 IMPLANT
NEEDLE FILTER BLUNT 18X 1/2SAF (NEEDLE) ×1
NEEDLE FILTER BLUNT 18X1 1/2 (NEEDLE) ×1 IMPLANT
PACK BASIN MINOR ARMC (MISCELLANEOUS) ×2 IMPLANT
PIN SAFETY STRL (MISCELLANEOUS) IMPLANT
SHEARS FOC LG CVD HARMONIC 17C (MISCELLANEOUS) IMPLANT
SLEVE PROBE SENORX GAMMA FIND (MISCELLANEOUS) ×2 IMPLANT
SPONGE LAP 18X18 5 PK (GAUZE/BANDAGES/DRESSINGS) ×2 IMPLANT
STRIP CLOSURE SKIN 1/2X4 (GAUZE/BANDAGES/DRESSINGS) ×4 IMPLANT
SUT ETHILON 3-0 FS-10 30 BLK (SUTURE) ×2
SUT SILK 0 (SUTURE) ×1
SUT SILK 0 30XBRD TIE 6 (SUTURE) ×1 IMPLANT
SUT SILK 3 0 (SUTURE) ×1
SUT SILK 3-0 18XBRD TIE 12 (SUTURE) ×1 IMPLANT
SUT VIC AB 2-0 CT1 27 (SUTURE) ×2
SUT VIC AB 2-0 CT1 TAPERPNT 27 (SUTURE) ×2 IMPLANT
SUT VIC AB 2-0 CT2 27 (SUTURE) IMPLANT
SUT VIC AB 3-0 SH 27 (SUTURE)
SUT VIC AB 3-0 SH 27X BRD (SUTURE) IMPLANT
SUT VICRYL+ 3-0 144IN (SUTURE) ×2 IMPLANT
SUTURE EHLN 3-0 FS-10 30 BLK (SUTURE) ×1 IMPLANT
SWABSTK COMLB BENZOIN TINCTURE (MISCELLANEOUS) ×2 IMPLANT
SYRINGE 10CC LL (SYRINGE) ×2 IMPLANT
TAPE TRANSPORE STRL 2 31045 (GAUZE/BANDAGES/DRESSINGS) IMPLANT

## 2016-09-13 NOTE — Anesthesia Procedure Notes (Signed)
Procedure Name: LMA Insertion Performed by: Norvin Ohlin Pre-anesthesia Checklist: Patient identified, Patient being monitored, Timeout performed, Emergency Drugs available and Suction available Patient Re-evaluated:Patient Re-evaluated prior to inductionOxygen Delivery Method: Circle system utilized Preoxygenation: Pre-oxygenation with 100% oxygen Intubation Type: IV induction LMA: LMA inserted LMA Size: 3.5 Tube type: Oral Number of attempts: 1 Placement Confirmation: positive ETCO2 and breath sounds checked- equal and bilateral Tube secured with: Tape Dental Injury: Teeth and Oropharynx as per pre-operative assessment        

## 2016-09-13 NOTE — OR Nursing (Signed)
1400 Dr. Bary Castilla visited. Ok'd dc home with instructions.

## 2016-09-13 NOTE — Anesthesia Post-op Follow-up Note (Cosign Needed)
Anesthesia QCDR form completed.        

## 2016-09-13 NOTE — Discharge Instructions (Signed)
AMBULATORY SURGERY  DISCHARGE INSTRUCTIONS   1) The drugs that you were given will stay in your system until tomorrow so for the next 24 hours you should not:  A) Drive an automobile B) Make any legal decisions C) Drink any alcoholic beverage   2) You may resume regular meals tomorrow.  Today it is better to start with liquids and gradually work up to solid foods.  You may eat anything you prefer, but it is better to start with liquids, then soup and crackers, and gradually work up to solid foods.   3) Please notify your doctor immediately if you have any unusual bleeding, trouble breathing, redness and pain at the surgery site, drainage, fever, or pain not relieved by medication.    4) Additional Instructions: Left arm precautions dicussed due to lymph node dissection. JP drain instructions done. Written instructions for JP drain.  Please contact your physician with any problems or Same Day Surgery at [phone], Monday through Friday 6 am to 4 pm, or [place] at [facility] number at [phone].

## 2016-09-13 NOTE — Transfer of Care (Signed)
Immediate Anesthesia Transfer of Care Note  Patient: Tammy Cannon  Procedure(s) Performed: Procedure(s): SIMPLE MASTECTOMY (Left) SENTINEL NODE BIOPSY (Left)  Patient Location: PACU  Anesthesia Type:General  Level of Consciousness: awake, alert  and oriented  Airway & Oxygen Therapy: Patient Spontanous Breathing and Patient connected to face mask oxygen  Post-op Assessment: Report given to RN and Post -op Vital signs reviewed and stable  Post vital signs: Reviewed and stable  Last Vitals:  Vitals:   09/13/16 0839 09/13/16 1212  BP: 137/62 (!) 98/46  Pulse: 61 70  Resp: 16 (!) 8  Temp: 36.6 C 36.7 C    Last Pain:  Vitals:   09/13/16 0839  TempSrc: Oral         Complications: No apparent anesthesia complications

## 2016-09-13 NOTE — Op Note (Signed)
Preoperative diagnosis: DCIS left breast.  Postoperative diagnosis same.  Operative procedure: Left simple mastectomy with sentinel node biopsy.  Operating surgeon: Ollen Bowl, M.D.  Anesthesia: Gen. by LMA.  Estimated blood loss: Less than 25 mL.  Clinical note: This 60 year woman who is previously undergone a contralateral mastectomy for invasive cancer was recently diagnosed with DCIS. She desired mastectomy. Sentinel node biopsy is completed in the event an invasive cancer was identified.  Operative note: The patient tolerated general anesthesia well. The left breast chest and neck somewhat was prepped with ChloraPrep and draped. An elliptical incision was outlined. Skin was sharply the remaining dissection completed with electrocautery. The margins of resection were the sternum medially, clavicle superiorly, serratus muscle laterally and the rectus fascia inferiorly. The breast and underlying pectoralis fascia were taken with the specimen. Prior to the procedure she had been injected with technetium sulfur colloid. Prior to surgical incision 5 cc's of  percent methylene blue was injected in subareolar plexus first unknown identification. One hot and blue lymph node as well as one hot lymph node were identified and lower level of the axilla. These were removed and sent together in formalin for routine histology. After the breast to be removed from the surgical field the wound was irrigated with water. Good hemostasis was noted. Intercostal vessels had been controlled with 3-0 Vicryl ties.  The breast flaps were reapproximated with running 2-0 Vicryl suture in the deep dermal layer into segments. Prior to wound closure a Blake drain was brought out the lower medial flap and a grand position with 3-0 nylon suture. This was placed to closed suction at the end procedure. Benzoin Steri-Strips were applied followed by Telfa pad, fluff gauze and an Ace wrap.  The patient tolerated the procedure  well and was taken to the recovery room in stable condition.

## 2016-09-13 NOTE — Anesthesia Preprocedure Evaluation (Signed)
Anesthesia Evaluation  Patient identified by MRN, date of birth, ID band Patient awake    Reviewed: Allergy & Precautions, NPO status , Patient's Chart, lab work & pertinent test results, reviewed documented beta blocker date and time   History of Anesthesia Complications (+) PONV and history of anesthetic complications  Airway Mallampati: II  TM Distance: >3 FB     Dental  (+) Chipped   Pulmonary           Cardiovascular hypertension, Pt. on medications + CAD       Neuro/Psych Anxiety    GI/Hepatic GERD  Controlled,  Endo/Other  diabetes, Type 2Hypothyroidism   Renal/GU      Musculoskeletal   Abdominal   Peds  Hematology   Anesthesia Other Findings   Reproductive/Obstetrics                             Anesthesia Physical Anesthesia Plan  ASA: III  Anesthesia Plan: General   Post-op Pain Management:    Induction: Intravenous  Airway Management Planned: LMA  Additional Equipment:   Intra-op Plan:   Post-operative Plan:   Informed Consent: I have reviewed the patients History and Physical, chart, labs and discussed the procedure including the risks, benefits and alternatives for the proposed anesthesia with the patient or authorized representative who has indicated his/her understanding and acceptance.     Plan Discussed with: CRNA  Anesthesia Plan Comments:         Anesthesia Quick Evaluation

## 2016-09-13 NOTE — Anesthesia Postprocedure Evaluation (Signed)
Anesthesia Post Note  Patient: Tammy Cannon  Procedure(s) Performed: Procedure(s) (LRB): SIMPLE MASTECTOMY (Left) SENTINEL NODE BIOPSY (Left)  Patient location during evaluation: PACU Anesthesia Type: General Level of consciousness: awake and alert Pain management: pain level controlled Vital Signs Assessment: post-procedure vital signs reviewed and stable Respiratory status: spontaneous breathing, nonlabored ventilation, respiratory function stable and patient connected to nasal cannula oxygen Cardiovascular status: blood pressure returned to baseline and stable Postop Assessment: no signs of nausea or vomiting Anesthetic complications: no     Last Vitals:  Vitals:   09/13/16 1257 09/13/16 1305  BP: 125/68 (!) 140/55  Pulse: (!) 58 61  Resp: 16 16  Temp: 36.3 C (!) 36.1 C    Last Pain:  Vitals:   09/13/16 1305  TempSrc: Tympanic  PainSc: [place]

## 2016-09-16 ENCOUNTER — Telehealth: Payer: Self-pay | Admitting: General Surgery

## 2016-09-16 ENCOUNTER — Ambulatory Visit (INDEPENDENT_AMBULATORY_CARE_PROVIDER_SITE_OTHER): Payer: Managed Care, Other (non HMO) | Admitting: *Deleted

## 2016-09-16 DIAGNOSIS — D0512 Intraductal carcinoma in situ of left breast: Secondary | ICD-10-CM

## 2016-09-16 LAB — SURGICAL PATHOLOGY

## 2016-09-16 NOTE — Patient Instructions (Signed)
May remove wrap on Sunday and shower The patient is aware to use a heating pad as needed for comfort.

## 2016-09-16 NOTE — Progress Notes (Addendum)
Patient ID: Tammy Cannon, female   DOB: 1941-02-19, 76 y.o.   MRN: [id number]  Patient came in today for a wound check/post breast surgery.  The wound is clean, with no signs of infection noted. Mild hematoma/bruising left axillary area, pt states is tender. Drainage is 30-45 ml. The patient is aware to use a heating pad as needed for comfort. May remove wrap on Sunday and shower. Drain removed today by Dr Bary Castilla.  Follow up as scheduled.

## 2016-09-16 NOTE — Telephone Encounter (Signed)
Notified no upstaging in pathology.  She reports some swelling above the compressive wrap.    F/U as scheduled on Monday, May 7.

## 2016-09-20 ENCOUNTER — Ambulatory Visit (INDEPENDENT_AMBULATORY_CARE_PROVIDER_SITE_OTHER): Payer: Managed Care, Other (non HMO) | Admitting: General Surgery

## 2016-09-20 ENCOUNTER — Encounter: Payer: Self-pay | Admitting: General Surgery

## 2016-09-20 VITALS — BP 130/70 | HR 72 | Resp 13 | Ht 62.0 in | Wt 129.0 lb

## 2016-09-20 DIAGNOSIS — D0512 Intraductal carcinoma in situ of left breast: Secondary | ICD-10-CM

## 2016-09-20 NOTE — Progress Notes (Signed)
Patient ID: Tammy Cannon, female   DOB: [birth year], 76 y.o.   MRN: [id number]  Chief Complaint  Patient presents with  . Routine Post Op    left mastetomy     HPI Tammy Cannon is a 76 y.o. female here today for her post op left mastectomy done on 09/13/2016. Patient states she has been having pain in that area. She has not took anything for the pain. Patient has been using a heating pad. She is here today with her daughter, Tammy Cannon   HPI  Past Medical History:  Diagnosis Date  . Breast cancer [facility]) 1989   Right mastectomy /pos node Tammy Cannon /Dr Cerame  . Cardiomegaly   . Complication of anesthesia    with colonoscopy  . Coronary atherosclerosis of native coronary artery   . DCIS (ductal carcinoma in situ) 08/19/2016   left breast  . Diabetes mellitus without complication ([place])   . GERD (gastroesophageal reflux disease)   . History of kidney stones   . Hypothyroidism   . Other and unspecified hyperlipidemia   . Other chronic pulmonary heart diseases   . Panic attacks   . PONV (postoperative nausea and vomiting)   . Unspecified essential hypertension     Past Surgical History:  Procedure Laterality Date  . ABDOMINAL HYSTERECTOMY    . BREAST BIOPSY Left 08/19/2016   DUCTAL CARCINOMA IN SITU (DCIS) INVOLVING A SCLEROSING LESION  . CHOLECYSTECTOMY    . lumpectomy (otheR)    . MASTECTOMY Right   . SENTINEL NODE BIOPSY Left 09/13/2016   Procedure: SENTINEL NODE BIOPSY;  Surgeon: [name], MD;  Location: [facility] ORS;  Service: General;  Laterality: Left;  . SIMPLE MASTECTOMY WITH AXILLARY SENTINEL NODE BIOPSY Left 09/13/2016   Procedure: SIMPLE MASTECTOMY;  Surgeon: [name], MD;  Location: [facility] ORS;  Service: General;  Laterality: Left;    Family History  Problem Relation Age of Onset  . Breast cancer Sister     Social History Social History  Substance Use Topics  . Smoking status: Never Smoker  . Smokeless tobacco: Never Used  . Alcohol use No     No Known Allergies  Current Outpatient Prescriptions  Medication Sig Dispense Refill  . cholecalciferol (VITAMIN D) 1000 units tablet Take 1,000 Units by mouth daily.    Marland Kitchen levothyroxine (SYNTHROID, LEVOTHROID) 88 MCG tablet Take 88 mcg by mouth daily before breakfast.     . metFORMIN (GLUCOPHAGE) 1000 MG tablet Take 1,000 mg by mouth 2 (two) times daily with a meal.    . pantoprazole (PROTONIX) 40 MG tablet Take 40 mg by mouth every morning.     . pravastatin (PRAVACHOL) 40 MG tablet Take 40 mg by mouth every evening.     . trandolapril (MAVIK) 2 MG tablet Take 2 mg by mouth every evening.     . traZODone (DESYREL) 50 MG tablet Take 50 mg by mouth at bedtime.     . verapamil (VERELAN PM) 240 MG 24 hr capsule Take 240 mg by mouth at bedtime.      No current facility-administered medications for this visit.     Review of Systems Review of Systems  Constitutional: Negative.   Respiratory: Negative.   Cardiovascular: Negative.     Blood pressure 130/70, pulse 72, resp. rate 13, height 5\' 2"  (1.575 m), weight 129 lb (58.5 kg).  Physical Exam Physical Exam  Constitutional: She is oriented to person, place, and time. She appears well-developed and well-nourished.  Pulmonary/Chest:  Excellent shoulder range of motion with nearly full abduction.  Neurological: She is alert and oriented to person, place, and time.  Skin: Skin is warm and dry.    Data Reviewed 09/13/2016 mastectomy resection pathology:   A. LEFT BREAST; SIMPLE MASTECTOMY:  - DUCTAL CARCINOMA IN SITU INVOLVING A COMPLEX SCLEROSING LESION.  - THE SURGICAL MARGINS ARE NEGATIVE.  - MICROCALCIFICATIONS IN BENIGN AND MALIGNANT TISSUE.  - NEGATIVE FOR INVASIVE CARCINOMA.  - SEE SUMMARY BELOW.  Size (Extent) of DCIS: at least 32 mm  B. SENTINEL LYMPH NODES 1 AND 2; EXCISION:  - NO TUMOR SEEN IN TWO LYMPH NODES (0/2).  08/19/2016 biopsy biomarker result: BREAST BIOMARKER TESTS  Estrogen Receptor (ER) Status:  Positive, greater than 90%  Progesterone Receptor (PgR) Status: Positive, greater than 90%   Assessment    Doing well status post left mastectomy.    Plan    No indication for chemoprevention in light of negative margins, total mastectomy and clear lymph nodes.  Patient to return in 10 days.    HPI, Physical Exam, Assessment and Plan have been scribed under the direction and in the presence of [name], MD.  [name], CMA  I have completed the exam and reviewed the above documentation for accuracy and completeness.  I agree with the above.  Haematologist has been used and any errors in dictation or transcription are unintentional.  [name], M.D., F.A.C.S.   [name] 09/20/2016, 6:24 PM

## 2016-09-27 ENCOUNTER — Encounter: Payer: Self-pay | Admitting: Podiatry

## 2016-09-27 ENCOUNTER — Ambulatory Visit (INDEPENDENT_AMBULATORY_CARE_PROVIDER_SITE_OTHER): Payer: Managed Care, Other (non HMO) | Admitting: Podiatry

## 2016-09-27 DIAGNOSIS — L600 Ingrowing nail: Secondary | ICD-10-CM

## 2016-09-27 MED ORDER — NEOMYCIN-POLYMYXIN-HC 3.5-10000-1 OT SOLN: OTIC | 0 refills | Status: DC

## 2016-09-27 NOTE — Progress Notes (Signed)
   Subjective:    Patient ID: Tammy Cannon, female    DOB: Sep 28, 1940, 76 y.o.   MRN: [id number]  HPI: She presents today with a chief complaint of ingrown toenail tibial border of the hallux left. She states that she's had this problem for many years and she seems to be getting worse. She states that even sometimes is infected.  Review of Systems  Constitutional: Positive for unexpected weight change.  Gastrointestinal: Positive for abdominal distention and diarrhea.  All other systems reviewed and are negative.      Objective:   Physical Exam: Vital signs are stable alert and oriented 3. Pulses are palpable. Neurologic sensorium is intact. Deep tendon reflexes are intact. Muscle strength is 5 over 5 dorsiflexion plantar flexors and inverters everters on his musculature is intact. Orthopedic evaluation and strength all joints distal to the ankle full range of motion without crepitation. She sharp incurvated nail margin along the tibial border hallux left with mild erythema and edema painful on palpation.        Assessment & Plan:  Assessment: Ingrown nail paronychia abscess hallux left.  Plan: Discussed etiology pathology concerned versus surgical therapies. Matrixectomy was performed today tibial border hallux left of the local anesthesia was measured. She tolerated the procedure well. At this point we provided her with both oral and written instructions for care and soaking of her foot. Also provided her with a prescription for Cortisporin Otic to be applied twice daily. Follow-Up with Her in about 3 Weeks

## 2016-09-27 NOTE — Patient Instructions (Addendum)

## 2016-09-30 ENCOUNTER — Ambulatory Visit (INDEPENDENT_AMBULATORY_CARE_PROVIDER_SITE_OTHER): Payer: Managed Care, Other (non HMO) | Admitting: General Surgery

## 2016-09-30 ENCOUNTER — Encounter: Payer: Self-pay | Admitting: General Surgery

## 2016-09-30 VITALS — BP 160/78 | HR 82 | Resp 12 | Ht 63.0 in | Wt 130.0 lb

## 2016-09-30 DIAGNOSIS — D0512 Intraductal carcinoma in situ of left breast: Secondary | ICD-10-CM

## 2016-09-30 NOTE — Progress Notes (Signed)
Patient ID: Tammy Cannon, female   DOB: 01-31-1941, 76 y.o.   MRN: [id number]  Chief Complaint  Patient presents with  . Routine Post Op    Left mastectomy    HPI Tammy Cannon is a 76 y.o. female is here today for a follow up left breast mastectomy. Patient states she is doing well. Karen Kitchens her daughter is present. Her daughter stated her mother's energy has not bounced back yet, feels fatigued. HPI  Past Medical History:  Diagnosis Date  . Breast cancer [facility]) 1989   Right mastectomy /pos node [name] /Dr Cerame  . Cardiomegaly   . Complication of anesthesia    with colonoscopy  . Coronary atherosclerosis of native coronary artery   . DCIS (ductal carcinoma in situ) 08/19/2016   left breast  . Diabetes mellitus without complication ([place])   . GERD (gastroesophageal reflux disease)   . History of kidney stones   . Hypothyroidism   . Other and unspecified hyperlipidemia   . Other chronic pulmonary heart diseases   . Panic attacks   . PONV (postoperative nausea and vomiting)   . Unspecified essential hypertension     Past Surgical History:  Procedure Laterality Date  . ABDOMINAL HYSTERECTOMY    . BREAST BIOPSY Left 08/19/2016   DUCTAL CARCINOMA IN SITU (DCIS) INVOLVING A SCLEROSING LESION  . CHOLECYSTECTOMY    . lumpectomy (otheR)    . MASTECTOMY Right   . SENTINEL NODE BIOPSY Left 09/13/2016   Procedure: SENTINEL NODE BIOPSY;  Surgeon: [name], MD;  Location: [facility] ORS;  Service: General;  Laterality: Left;  . SIMPLE MASTECTOMY WITH AXILLARY SENTINEL NODE BIOPSY Left 09/13/2016   Procedure: SIMPLE MASTECTOMY;  Surgeon: [name], MD;  Location: [facility] ORS;  Service: General;  Laterality: Left;    Family History  Problem Relation Age of Onset  . Breast cancer Sister     Social History Social History  Substance Use Topics  . Smoking status: Never Smoker  . Smokeless tobacco: Never Used  . Alcohol use No    No Known Allergies  Current  Outpatient Prescriptions  Medication Sig Dispense Refill  . cholecalciferol (VITAMIN D) 1000 units tablet Take 1,000 Units by mouth daily.    Marland Kitchen levothyroxine (SYNTHROID, LEVOTHROID) 88 MCG tablet Take 88 mcg by mouth daily before breakfast.     . metFORMIN (GLUCOPHAGE) 1000 MG tablet Take 1,000 mg by mouth 2 (two) times daily with a meal.    . neomycin-polymyxin-hydrocortisone (CORTISPORIN) otic solution Apply one to two drops to toe after soaking twice daily. 10 mL 0  . pantoprazole (PROTONIX) 40 MG tablet Take 40 mg by mouth every morning.     . pravastatin (PRAVACHOL) 40 MG tablet Take 40 mg by mouth every evening.     . trandolapril (MAVIK) 2 MG tablet Take 2 mg by mouth every evening.     . traZODone (DESYREL) 50 MG tablet Take 50 mg by mouth at bedtime.     . verapamil (VERELAN PM) 240 MG 24 hr capsule Take 240 mg by mouth at bedtime.      No current facility-administered medications for this visit.     Review of Systems Review of Systems  Constitutional: Negative.   Respiratory: Negative.   Cardiovascular: Negative.     Blood pressure (!) 160/78, pulse 82, resp. rate 12, height 5\' 3"  (1.6 m), weight 130 lb (59 kg).  Physical Exam Physical Exam  Constitutional: She is oriented to person, place, and  time. She appears well-developed and well-nourished.  Pulmonary/Chest:    incisions healing well.  Full shoulder range of motion.  Neurological: She is alert and oriented to person, place, and time.  Skin: Skin is warm and dry.      Assessment    Doing well post mastectomy.  Mild fatigue related to general anesthesia.    Plan    Patient has been encouraged to begin her regular activities to improve her strength and sense of well-being.  Anticipate returned work after [employer]. She'll notify the office if she has trouble with the lifting her job presently requires (40 pounds).  Follow up in one month.   Prescription given for prosthesis.  HPI, Physical Exam,  Assessment and Plan have been scribed under the direction and in the presence of [name], MD.  Verlene Mayer, CMA  I have completed the exam and reviewed the above documentation for accuracy and completeness.  I agree with the above.  Haematologist has been used and any errors in dictation or transcription are unintentional.  [name], M.D., F.A.C.S.         [name] 09/30/2016, 9:42 AM

## 2016-09-30 NOTE — Patient Instructions (Addendum)
Follow up in month

## 2016-10-07 DIAGNOSIS — C50112 Malignant neoplasm of central portion of left female breast: Secondary | ICD-10-CM | POA: Diagnosis not present

## 2016-10-07 DIAGNOSIS — C50111 Malignant neoplasm of central portion of right female breast: Secondary | ICD-10-CM | POA: Diagnosis not present

## 2016-10-07 DIAGNOSIS — Z9013 Acquired absence of bilateral breasts and nipples: Secondary | ICD-10-CM | POA: Diagnosis not present

## 2016-10-18 ENCOUNTER — Ambulatory Visit (INDEPENDENT_AMBULATORY_CARE_PROVIDER_SITE_OTHER): Payer: Managed Care, Other (non HMO) | Admitting: Podiatry

## 2016-10-18 DIAGNOSIS — L600 Ingrowing nail: Secondary | ICD-10-CM

## 2016-10-18 NOTE — Patient Instructions (Signed)

## 2016-10-18 NOTE — Progress Notes (Signed)
She presents today for follow-up of matrixectomy tibial border hallux left. She states that she's doing very well but still getting some drainage. She states she had she has not been soaking it recently. She does continue to cover daily with a Band-Aid.  Objective: Vital signs are stable she is alert and oriented 3. Pulses are palpable. Neurologic sensorium is intact. Deep tendon reflexes are intact muscle strength is the same. No open lesions or wounds margin appears to be healing in very nicely see no signs of infection.  Assessment: Well-healing surgical toe tibial border hallux left.  Plan: I recommended that she continue to soak the toe at least every other day in Epsom salts and warm water until the drainage has subsided. Should this become more red or more painful she is to notify us immediately. Follow up with her in 1 month if necessary.

## 2016-11-02 ENCOUNTER — Encounter: Payer: Self-pay | Admitting: General Surgery

## 2016-11-02 ENCOUNTER — Ambulatory Visit (INDEPENDENT_AMBULATORY_CARE_PROVIDER_SITE_OTHER): Payer: Managed Care, Other (non HMO) | Admitting: General Surgery

## 2016-11-02 VITALS — BP 130/74 | HR 76 | Resp 14 | Ht 62.0 in | Wt 131.0 lb

## 2016-11-02 DIAGNOSIS — D0512 Intraductal carcinoma in situ of left breast: Secondary | ICD-10-CM

## 2016-11-02 NOTE — Patient Instructions (Signed)
The patient is aware to call back for any questions or concerns.  

## 2016-11-02 NOTE — Progress Notes (Signed)
Patient ID: Tammy Cannon, female   DOB: [birth year], 76 y.o.   MRN: [id number]  Chief Complaint  Patient presents with  . Follow-up    HPI Tammy Cannon is a 76 y.o. female today for a follow up left breast mastectomy. Patient states she is doing well.  She has went back to work and doing well. Karen Kitchens, her daughter is present.   HPI  Past Medical History:  Diagnosis Date  . Breast cancer [facility]) 1989   Right mastectomy /pos node [name] /Dr Cerame  . Cardiomegaly   . Complication of anesthesia    with colonoscopy  . Coronary atherosclerosis of native coronary artery   . DCIS (ductal carcinoma in situ) 08/19/2016   left breast  . Diabetes mellitus without complication ([place])   . GERD (gastroesophageal reflux disease)   . History of kidney stones   . Hypothyroidism   . Other and unspecified hyperlipidemia   . Other chronic pulmonary heart diseases   . Panic attacks   . PONV (postoperative nausea and vomiting)   . Unspecified essential hypertension     Past Surgical History:  Procedure Laterality Date  . ABDOMINAL HYSTERECTOMY    . BREAST BIOPSY Left 08/19/2016   DUCTAL CARCINOMA IN SITU (DCIS) INVOLVING A SCLEROSING LESION  . CHOLECYSTECTOMY    . lumpectomy (otheR)    . MASTECTOMY Right   . SENTINEL NODE BIOPSY Left 09/13/2016   Procedure: SENTINEL NODE BIOPSY;  Surgeon: [name], MD;  Location: [facility] ORS;  Service: General;  Laterality: Left;  . SIMPLE MASTECTOMY WITH AXILLARY SENTINEL NODE BIOPSY Left 09/13/2016   Procedure: SIMPLE MASTECTOMY;  Surgeon: [name], MD;  Location: [facility] ORS;  Service: General;  Laterality: Left;    Family History  Problem Relation Age of Onset  . Breast cancer Sister     Social History Social History  Substance Use Topics  . Smoking status: Never Smoker  . Smokeless tobacco: Never Used  . Alcohol use No    No Known Allergies  Current Outpatient Prescriptions  Medication Sig Dispense Refill  . cholecalciferol  (VITAMIN D) 1000 units tablet Take 1,000 Units by mouth daily.    Marland Kitchen levothyroxine (SYNTHROID, LEVOTHROID) 88 MCG tablet Take 88 mcg by mouth daily before breakfast.     . metFORMIN (GLUCOPHAGE) 1000 MG tablet Take 1,000 mg by mouth 2 (two) times daily with a meal.    . neomycin-polymyxin-hydrocortisone (CORTISPORIN) otic solution Apply one to two drops to toe after soaking twice daily. 10 mL 0  . pantoprazole (PROTONIX) 40 MG tablet Take 40 mg by mouth every morning.     . pravastatin (PRAVACHOL) 40 MG tablet Take 40 mg by mouth every evening.     . trandolapril (MAVIK) 2 MG tablet Take 2 mg by mouth every evening.     . traZODone (DESYREL) 50 MG tablet Take 50 mg by mouth at bedtime.     . verapamil (VERELAN PM) 240 MG 24 hr capsule Take 240 mg by mouth at bedtime.      No current facility-administered medications for this visit.     Review of Systems Review of Systems  Constitutional: Negative.   Respiratory: Negative.   Cardiovascular: Negative.     Blood pressure 130/74, pulse 76, resp. rate 14, height 5\' 2"  (1.575 m), weight 131 lb (59.4 kg).  Physical Exam Physical Exam  Constitutional: She is oriented to person, place, and time. She appears well-developed and well-nourished.  Pulmonary/Chest:  Full shoulder range of motion. No evidence of upper extremity edema.  Neurological: She is alert and oriented to person, place, and time.  Skin: Skin is warm and dry.  Psychiatric: Her behavior is normal.    Data Reviewed Left breast pathology showed a 2.4 cm area of DCIS. ER/PR positive.  Assessment    Doing well status post right simple mastectomy.    Plan    Based on the fact the patient has had a contralateral mastectomy and had noninvasive cancer, no indication for antiestrogen therapy.    Follow up in 3 months. May use heating pad as needed for comfort, discussed safety precautions. The patient is aware to call back for any questions or concerns.   HPI, Physical  Exam, Assessment and Plan have been scribed under the direction and in the presence of [name], MD.  Karie Fetch, RN  I have completed the exam and reviewed the above documentation for accuracy and completeness.  I agree with the above.  Haematologist has been used and any errors in dictation or transcription are unintentional.  [name], M.D., F.A.C.S.   [name] 11/03/2016, 6:31 AM

## 2017-01-31 ENCOUNTER — Ambulatory Visit: Payer: Managed Care, Other (non HMO) | Admitting: General Surgery

## 2017-02-02 ENCOUNTER — Ambulatory Visit (INDEPENDENT_AMBULATORY_CARE_PROVIDER_SITE_OTHER): Payer: Managed Care, Other (non HMO) | Admitting: General Surgery

## 2017-02-02 ENCOUNTER — Encounter: Payer: Self-pay | Admitting: General Surgery

## 2017-02-02 VITALS — BP 186/82 | HR 78 | Resp 18 | Ht 64.0 in | Wt 130.0 lb

## 2017-02-02 DIAGNOSIS — D0512 Intraductal carcinoma in situ of left breast: Secondary | ICD-10-CM

## 2017-02-02 NOTE — Progress Notes (Signed)
Patient ID: Tammy Cannon, female   DOB: 1941/04/02, 76 y.o.   MRN: [id number]  Chief Complaint  Patient presents with  . Breast Cancer    HPI Tammy Cannon is a 76 y.o. female here today for her three month follow up breast cancer.Patient states she is doing well.  Tammy, Olegario Cannon is present at visit.  HPI  Past Medical History:  Diagnosis Date  . Breast cancer [facility]) 1989   Right mastectomy /pos node Tammy Cannon /Dr Cerame  . Cardiomegaly   . Complication of anesthesia    with colonoscopy  . Coronary atherosclerosis of native coronary artery   . DCIS (ductal carcinoma in situ) 08/19/2016   left breast  . Diabetes mellitus without complication ([name])   . GERD (gastroesophageal reflux disease)   . History of kidney stones   . Hypothyroidism   . Other and unspecified hyperlipidemia   . Other chronic pulmonary heart diseases   . Panic attacks   . PONV (postoperative nausea and vomiting)   . Unspecified essential hypertension     Past Surgical History:  Procedure Laterality Date  . ABDOMINAL HYSTERECTOMY    . BREAST BIOPSY Left 08/19/2016   DUCTAL CARCINOMA IN SITU (DCIS) INVOLVING A SCLEROSING LESION  . CHOLECYSTECTOMY    . lumpectomy (otheR)    . MASTECTOMY Right   . SENTINEL NODE BIOPSY Left 09/13/2016   Procedure: SENTINEL NODE BIOPSY;  Surgeon: Tammy Cannon;  Location: [facility] ORS;  Service: General;  Laterality: Left;  . SIMPLE MASTECTOMY WITH AXILLARY SENTINEL NODE BIOPSY Left 09/13/2016   Procedure: SIMPLE MASTECTOMY;  Surgeon: Tammy Cannon;  Location: [facility] ORS;  Service: General;  Laterality: Left;    Family History  Problem Relation Age of Onset  . Breast cancer Sister     Social History Social History  Substance Use Topics  . Smoking status: Never Smoker  . Smokeless tobacco: Never Used  . Alcohol use No    No Known Allergies  Current Outpatient Prescriptions  Medication Sig Dispense Refill  . cholecalciferol (VITAMIN D) 1000 units tablet  Take 1,000 Units by mouth daily.    Marland Kitchen levothyroxine (SYNTHROID, LEVOTHROID) 88 MCG tablet Take 88 mcg by mouth daily before breakfast.     . metFORMIN (GLUCOPHAGE) 1000 MG tablet Take 1,000 mg by mouth 2 (two) times daily with a meal.    . neomycin-polymyxin-hydrocortisone (CORTISPORIN) otic solution Apply one to two drops to toe after soaking twice daily. 10 mL 0  . pantoprazole (PROTONIX) 40 MG tablet Take 40 mg by mouth every morning.     . pravastatin (PRAVACHOL) 40 MG tablet Take 40 mg by mouth every evening.     . trandolapril (MAVIK) 2 MG tablet Take 2 mg by mouth every evening.     . traZODone (DESYREL) 50 MG tablet Take 50 mg by mouth at bedtime.     . verapamil (VERELAN PM) 240 MG 24 hr capsule Take 240 mg by mouth at bedtime.      No current facility-administered medications for this visit.     Review of Systems Review of Systems  Blood pressure (!) 186/82, pulse 78, resp. rate 18, height 5\' 4"  (1.626 m), weight 130 lb (59 kg).  Physical Exam Physical Exam  Constitutional: She is oriented to person, place, and time. She appears well-nourished.  Cardiovascular: Normal rate, regular rhythm and normal heart sounds.   Pulmonary/Chest: Effort normal and breath sounds normal.  Mastectomy site is clean and well healed.  Neurological: She is alert and oriented to person, place, and time.  Skin: Skin is warm and dry.  Full shoulder range of motion. Upper extremity edema.    Assessment    Doing well status post mastectomy.    Plan    The patient is working 10 hours a day, 6 days a week. Feels well.    Patient to return in April, 2019. The patient is aware to call back for any questions or concerns.  HPI, Physical Exam, Assessment and Plan have been scribed under the direction and in the presence of Tammy Ard, Cannon.  Tammy Cannon, CMA  I have completed the exam and reviewed the above documentation for accuracy and completeness.  I agree with the above.  Development worker, community has been used and any errors in dictation or transcription are unintentional.  Tammy Cannon, M.D., F.A.C.S.   Tammy Cannon 02/03/2017, 2:43 PM

## 2017-02-02 NOTE — Patient Instructions (Signed)
  Patient to return in April, 2019. The patient is aware to call back for any questions or concerns.

## 2017-02-10 DIAGNOSIS — E119 Type 2 diabetes mellitus without complications: Secondary | ICD-10-CM | POA: Diagnosis not present

## 2017-02-10 DIAGNOSIS — E559 Vitamin D deficiency, unspecified: Secondary | ICD-10-CM | POA: Diagnosis not present

## 2017-02-10 DIAGNOSIS — R5383 Other fatigue: Secondary | ICD-10-CM | POA: Diagnosis not present

## 2017-02-10 DIAGNOSIS — Z0000 Encounter for general adult medical examination without abnormal findings: Secondary | ICD-10-CM | POA: Diagnosis not present

## 2017-02-10 DIAGNOSIS — E7800 Pure hypercholesterolemia, unspecified: Secondary | ICD-10-CM | POA: Diagnosis not present

## 2017-04-23 DIAGNOSIS — M25571 Pain in right ankle and joints of right foot: Secondary | ICD-10-CM | POA: Diagnosis not present

## 2017-04-23 DIAGNOSIS — J019 Acute sinusitis, unspecified: Secondary | ICD-10-CM | POA: Diagnosis not present

## 2017-05-13 DIAGNOSIS — K219 Gastro-esophageal reflux disease without esophagitis: Secondary | ICD-10-CM | POA: Diagnosis not present

## 2017-05-13 DIAGNOSIS — E119 Type 2 diabetes mellitus without complications: Secondary | ICD-10-CM | POA: Diagnosis not present

## 2017-05-13 DIAGNOSIS — E039 Hypothyroidism, unspecified: Secondary | ICD-10-CM | POA: Diagnosis not present

## 2017-05-13 DIAGNOSIS — E559 Vitamin D deficiency, unspecified: Secondary | ICD-10-CM | POA: Diagnosis not present

## 2017-05-13 DIAGNOSIS — I10 Essential (primary) hypertension: Secondary | ICD-10-CM | POA: Diagnosis not present

## 2017-05-13 DIAGNOSIS — E7800 Pure hypercholesterolemia, unspecified: Secondary | ICD-10-CM | POA: Diagnosis not present

## 2017-05-13 DIAGNOSIS — E785 Hyperlipidemia, unspecified: Secondary | ICD-10-CM | POA: Diagnosis not present

## 2017-06-15 DIAGNOSIS — L03115 Cellulitis of right lower limb: Secondary | ICD-10-CM | POA: Diagnosis not present

## 2017-06-15 DIAGNOSIS — I10 Essential (primary) hypertension: Secondary | ICD-10-CM | POA: Diagnosis not present

## 2017-06-15 DIAGNOSIS — I878 Other specified disorders of veins: Secondary | ICD-10-CM | POA: Diagnosis not present

## 2017-06-15 DIAGNOSIS — K219 Gastro-esophageal reflux disease without esophagitis: Secondary | ICD-10-CM | POA: Diagnosis not present

## 2017-06-16 ENCOUNTER — Other Ambulatory Visit (INDEPENDENT_AMBULATORY_CARE_PROVIDER_SITE_OTHER): Payer: Managed Care, Other (non HMO)

## 2017-06-16 ENCOUNTER — Other Ambulatory Visit (INDEPENDENT_AMBULATORY_CARE_PROVIDER_SITE_OTHER): Payer: Self-pay | Admitting: Family Medicine

## 2017-06-16 DIAGNOSIS — M7989 Other specified soft tissue disorders: Secondary | ICD-10-CM

## 2017-06-24 DIAGNOSIS — K219 Gastro-esophageal reflux disease without esophagitis: Secondary | ICD-10-CM | POA: Diagnosis not present

## 2017-06-24 DIAGNOSIS — E785 Hyperlipidemia, unspecified: Secondary | ICD-10-CM | POA: Diagnosis not present

## 2017-06-24 DIAGNOSIS — L03115 Cellulitis of right lower limb: Secondary | ICD-10-CM | POA: Diagnosis not present

## 2017-06-24 DIAGNOSIS — E559 Vitamin D deficiency, unspecified: Secondary | ICD-10-CM | POA: Diagnosis not present

## 2017-08-12 DIAGNOSIS — K219 Gastro-esophageal reflux disease without esophagitis: Secondary | ICD-10-CM | POA: Diagnosis not present

## 2017-08-12 DIAGNOSIS — I10 Essential (primary) hypertension: Secondary | ICD-10-CM | POA: Diagnosis not present

## 2017-08-12 DIAGNOSIS — E785 Hyperlipidemia, unspecified: Secondary | ICD-10-CM | POA: Diagnosis not present

## 2017-08-12 DIAGNOSIS — E039 Hypothyroidism, unspecified: Secondary | ICD-10-CM | POA: Diagnosis not present

## 2017-08-25 ENCOUNTER — Encounter: Payer: Self-pay | Admitting: General Surgery

## 2017-08-25 ENCOUNTER — Ambulatory Visit (INDEPENDENT_AMBULATORY_CARE_PROVIDER_SITE_OTHER): Payer: Managed Care, Other (non HMO) | Admitting: General Surgery

## 2017-08-25 VITALS — BP 170/72 | HR 78 | Resp 16 | Ht 64.0 in | Wt 134.0 lb

## 2017-08-25 DIAGNOSIS — D0512 Intraductal carcinoma in situ of left breast: Secondary | ICD-10-CM

## 2017-08-25 DIAGNOSIS — I872 Venous insufficiency (chronic) (peripheral): Secondary | ICD-10-CM | POA: Diagnosis not present

## 2017-08-25 NOTE — Patient Instructions (Addendum)
Patient to return in one year breast cancer follow up. Patient to wear compassion stockings. The patient is aware to call back for any questions or concerns.

## 2017-08-25 NOTE — Progress Notes (Signed)
Patient ID: Tammy Cannon, female   DOB: [birth year], 77 y.o.   MRN: [id number]  Chief Complaint  Patient presents with  . Breast Cancer    HPI Tammy Cannon is a 77 y.o. female here today for her follow up breast cancer check up. Patient states she is doing well. Daughter, [name] is present at visit.   Patient continues to work full-time, 6-7 days/week.  She is seated during this time with her legs dependent.  HPI  Past Medical History:  Diagnosis Date  . Breast cancer [facility]) 1989   Right mastectomy /pos node [name] /Dr Cerame  . Cardiomegaly   . Complication of anesthesia    with colonoscopy  . Coronary atherosclerosis of native coronary artery   . DCIS (ductal carcinoma in situ) 08/19/2016   left breast  . Diabetes mellitus without complication ([place])   . GERD (gastroesophageal reflux disease)   . History of kidney stones   . Hypothyroidism   . Other and unspecified hyperlipidemia   . Other chronic pulmonary heart diseases   . Panic attacks   . PONV (postoperative nausea and vomiting)   . Unspecified essential hypertension     Past Surgical History:  Procedure Laterality Date  . ABDOMINAL HYSTERECTOMY    . BREAST BIOPSY Left 08/19/2016   DUCTAL CARCINOMA IN SITU (DCIS) INVOLVING A SCLEROSING LESION  . CHOLECYSTECTOMY    . lumpectomy (otheR)    . MASTECTOMY Right   . SENTINEL NODE BIOPSY Left 09/13/2016   Procedure: SENTINEL NODE BIOPSY;  Surgeon: [name], MD;  Location: [facility] ORS;  Service: General;  Laterality: Left;  . SIMPLE MASTECTOMY WITH AXILLARY SENTINEL NODE BIOPSY Left 09/13/2016   Procedure: SIMPLE MASTECTOMY;  Surgeon: [name], MD;  Location: [facility] ORS;  Service: General;  Laterality: Left;    Family History  Problem Relation Age of Onset  . Breast cancer Sister     Social History Social History   Tobacco Use  . Smoking status: Never Smoker  . Smokeless tobacco: Never Used  Substance Use Topics  . Alcohol use: No  . Drug use: No     No Known Allergies  Current Outpatient Medications  Medication Sig Dispense Refill  . cholecalciferol (VITAMIN D) 1000 units tablet Take 1,000 Units by mouth daily.    Marland Kitchen levothyroxine (SYNTHROID, LEVOTHROID) 88 MCG tablet Take 88 mcg by mouth daily before breakfast.     . metFORMIN (GLUCOPHAGE) 1000 MG tablet Take 1,000 mg by mouth 2 (two) times daily with a meal.    . neomycin-polymyxin-hydrocortisone (CORTISPORIN) otic solution Apply one to two drops to toe after soaking twice daily. 10 mL 0  . pantoprazole (PROTONIX) 40 MG tablet Take 40 mg by mouth every morning.     . pravastatin (PRAVACHOL) 40 MG tablet Take 40 mg by mouth every evening.     . trandolapril (MAVIK) 2 MG tablet Take 2 mg by mouth every evening.     . traZODone (DESYREL) 50 MG tablet Take 50 mg by mouth at bedtime.     . verapamil (VERELAN PM) 240 MG 24 hr capsule Take 240 mg by mouth at bedtime.     . Iron-Vitamin C (IRON 100/C PO) Take by mouth.     No current facility-administered medications for this visit.     Review of Systems Review of Systems  Constitutional: Negative.   Respiratory: Negative.   Cardiovascular: Negative.     Blood pressure (!) 170/72, pulse 78, resp. rate 16, height  5\' 4"  (1.626 m), weight 134 lb (60.8 kg).  Physical Exam Physical Exam  Constitutional: She is oriented to person, place, and time. She appears well-developed and well-nourished.  Cardiovascular: Normal rate, regular rhythm and normal heart sounds.  Pulmonary/Chest: Effort normal and breath sounds normal.    No visual asymmetry in the upper extremities.  Musculoskeletal:       Legs: Neurological: She is alert and oriented to person, place, and time.  Skin: Skin is warm and dry.    Data Reviewed No new data.  Assessment    Doing well now one year status post right mastectomy for DCIS.  Chronic lower extremity swelling, left greater than right.    Plan  The patient will likely benefit from the use of  fitted compressive stockings.  A prescription for same was provided, knee-high.  She has been asked to try these for 1 month and then report whether she has resolution of the lower extremity discomfort she is been experiencing.    Patient to return in one year breast cancer follow up. . The patient is aware to call back for any questions or concerns.   HPI, Physical Exam, Assessment and Plan have been scribed under the direction and in the presence of [name], MD.  [name], CMA  I have completed the exam and reviewed the above documentation for accuracy and completeness.  I agree with the above.  Haematologist has been used and any errors in dictation or transcription are unintentional.  [name], M.D., F.A.C.S.   Tammy Cannon 08/25/2017, 7:11 PM

## 2017-08-29 ENCOUNTER — Ambulatory Visit (INDEPENDENT_AMBULATORY_CARE_PROVIDER_SITE_OTHER): Payer: Managed Care, Other (non HMO) | Admitting: Podiatry

## 2017-08-29 ENCOUNTER — Ambulatory Visit (INDEPENDENT_AMBULATORY_CARE_PROVIDER_SITE_OTHER): Payer: Managed Care, Other (non HMO)

## 2017-08-29 ENCOUNTER — Encounter: Payer: Self-pay | Admitting: Podiatry

## 2017-08-29 ENCOUNTER — Ambulatory Visit: Payer: Managed Care, Other (non HMO) | Admitting: Podiatry

## 2017-08-29 DIAGNOSIS — L02619 Cutaneous abscess of unspecified foot: Secondary | ICD-10-CM

## 2017-08-29 DIAGNOSIS — M79675 Pain in left toe(s): Secondary | ICD-10-CM

## 2017-08-29 DIAGNOSIS — B351 Tinea unguium: Secondary | ICD-10-CM | POA: Diagnosis not present

## 2017-08-29 DIAGNOSIS — L03119 Cellulitis of unspecified part of limb: Secondary | ICD-10-CM | POA: Diagnosis not present

## 2017-08-29 DIAGNOSIS — M79676 Pain in unspecified toe(s): Secondary | ICD-10-CM

## 2017-08-29 MED ORDER — DOXYCYCLINE HYCLATE 100 MG PO TABS: 100.0000 mg | ORAL_TABLET | Freq: Two times a day (BID) | ORAL | 0 refills | Status: DC

## 2017-08-29 NOTE — Progress Notes (Signed)
She presents today for pain in the left lateral foot and ankle and leg.  States that this is been going on for 2 months.  She has been seeing her PCP and was given antibiotic which she has finished.  Her leg is red and warm to the touch initially beginning in the right leg and an ultrasound was performed to rule out a DVT.  She states that recently she has had more swelling in the left leg and it has begun to become painful as well.  She is also complaining of painful elongated toenails.  She denies any injury.  She states that she has swollen legs majority of the time that she has been itching them a lot scratching them until they bleed.  Objective: Vital signs are stable she is alert and oriented x3.  Pulses are palpable she has edema to the bilateral lower extremities with cellulitic process extending not to the level of the knee.  There are some areas of excoriations and some blistering.  This appears to be a venous stasis issue with cellulitis.  Her toenails are long thick yellow dystrophic and clinically mycotic.  Assessment: Pain in limb secondary to onychomycosis and cellulitis left greater than right at this point.  Her graph plan: Started her on doxycycline and debrided her nails for her today.  Instructed her to continue to wear her compression hose as well as to apply a moisturizing cream at nighttime.  On follow-up with her in a couple weeks to make sure that she is doing well.  Should she develop fever chills nausea vomiting she is to go to emergency room immediately.

## 2017-09-12 ENCOUNTER — Encounter: Payer: Self-pay | Admitting: Podiatry

## 2017-09-12 ENCOUNTER — Ambulatory Visit (INDEPENDENT_AMBULATORY_CARE_PROVIDER_SITE_OTHER): Payer: Managed Care, Other (non HMO) | Admitting: Podiatry

## 2017-09-12 DIAGNOSIS — L02619 Cutaneous abscess of unspecified foot: Secondary | ICD-10-CM

## 2017-09-12 DIAGNOSIS — L03119 Cellulitis of unspecified part of limb: Secondary | ICD-10-CM

## 2017-09-12 MED ORDER — DOXYCYCLINE HYCLATE 100 MG PO TABS: 100.0000 mg | ORAL_TABLET | Freq: Two times a day (BID) | ORAL | 0 refills | Status: DC

## 2017-09-12 NOTE — Progress Notes (Signed)
She presents today for follow-up of her cellulitis bilateral lower extremity.  She states that the swelling and the redness is much better but it is still there to some degree.  She denies fever chills nausea vomiting muscle aches pains that she has completely finished her doxycycline.  Objective: Vital signs are stable alert and oriented x3 mild erythema mild edema resolving cellulitis by approximately 90% no pain on palpation pulses remain strong and palpable.  No open lesions or wounds no blisters.  Assessment: Resolving cellulitis with venous stasis bilateral lower extremity.  Plan: I am going to request that she continue to wear her compression hose and Ahmar on her own 1 more dose of doxycycline this should be enough for her and I will see her back in 2 weeks unless she is completely well.

## 2017-09-13 ENCOUNTER — Ambulatory Visit: Payer: Managed Care, Other (non HMO) | Admitting: General Surgery

## 2017-10-05 ENCOUNTER — Ambulatory Visit (INDEPENDENT_AMBULATORY_CARE_PROVIDER_SITE_OTHER): Payer: Self-pay | Admitting: Podiatry

## 2017-10-05 ENCOUNTER — Encounter: Payer: Self-pay | Admitting: Podiatry

## 2017-10-05 DIAGNOSIS — L02619 Cutaneous abscess of unspecified foot: Secondary | ICD-10-CM

## 2017-10-05 DIAGNOSIS — L03119 Cellulitis of unspecified part of limb: Secondary | ICD-10-CM

## 2017-10-05 NOTE — Progress Notes (Signed)
She presents today for follow-up of her cellulitis bilateral lower extremity.  States that the legs have come down considerably they are not hot anymore and they are not as painful.  She states that she still has some doxycycline left to take and then she will be out.  She denies any problems taking the doxycycline.  Objective: Vital signs are stable she is alert and oriented x3.  Pulses are palpable.  She has dry tight indurated skin to the bilateral lower extremities due to years of venous insufficiency and cellulitic scarring.  At this point the redness has diminished considerably they are not warm to the touch there is no pain on compression of the calf.  Assessment: Well-healing cellulitis associated with venous insufficiency.  Plan: Encouraged her to wear her compression hose daily and to elevate her legs whenever she is sitting for long periods of time.  Should her legs become red and painful again she is to notify us immediately or follow-up with either the emergency department or her primary care doctor

## 2018-01-13 DIAGNOSIS — E039 Hypothyroidism, unspecified: Secondary | ICD-10-CM | POA: Diagnosis not present

## 2018-01-13 DIAGNOSIS — I10 Essential (primary) hypertension: Secondary | ICD-10-CM | POA: Diagnosis not present

## 2018-01-13 DIAGNOSIS — E785 Hyperlipidemia, unspecified: Secondary | ICD-10-CM | POA: Diagnosis not present

## 2018-01-13 DIAGNOSIS — K219 Gastro-esophageal reflux disease without esophagitis: Secondary | ICD-10-CM | POA: Diagnosis not present

## 2018-05-09 IMAGING — MG MM BREAST BX W LOC DEV 1ST LESION IMAGE BX SPEC STEREO GUIDE*L*
8 of 16 series · 8 of 32 positions shown · non-contrast
Comparison: Previous exams.

ADDENDUM:
Pathology of the left breast biopsy revealed DUCTAL CARCINOMA IN
SITU (DCIS) INVOLVING A SCLEROSING LESION. This was found to be
concordant by Dr. Arissa.

Recommendation:  Surgical referral for excision.
Results and recommendations were relayed to Pallavi, Dr. Bairbre
nurse on 08/23/16 along with the information that the patient would
like for her Gato, Jn to be given the results. She stated
that Dr. Gait would like for the patient to make an appointment
to come into the office to discuss results. Locks, Misti
contacted the [facility] and explained the recommendations from
Dr. Arissa that she would like for the patient to see a surgeon
regardless of the results due to the radiographic appearance of
distortion on the mammogram. She stated her mother has done well
following the biopsy with no bleeding, bruising, or hematoma. She
will contact Dr. [REDACTED] for an appointment.
Bairbre received a call from Dr. Locks, Misti nurse on
08/24/16 because they had not received a copy of the pathology
report. The patient has an appointment on 08/24/16 with one of the
providers in that office. A copy of the report was faxed to the
nurse by Bairbre.
Addendum by Locks, Misti on 08/24/16.
CLINICAL DATA: 75-year-old female for tomosynthesis guided biopsy
of a left breast distortion
EXAM:
LEFT BREAST STEREOTACTIC CORE NEEDLE BIOPSY

[L (1 of 8)]
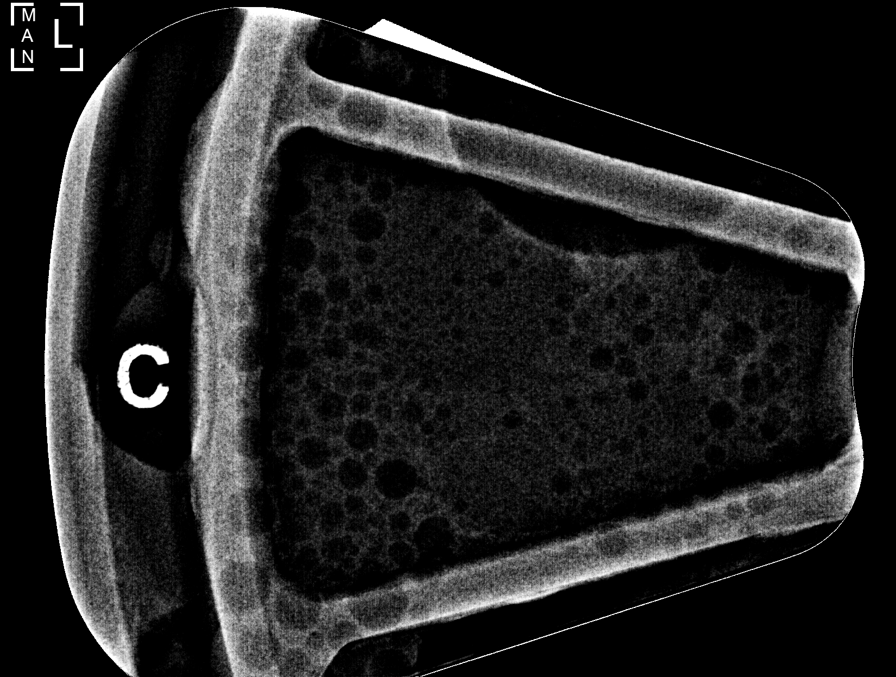

[L (2 of 8)]
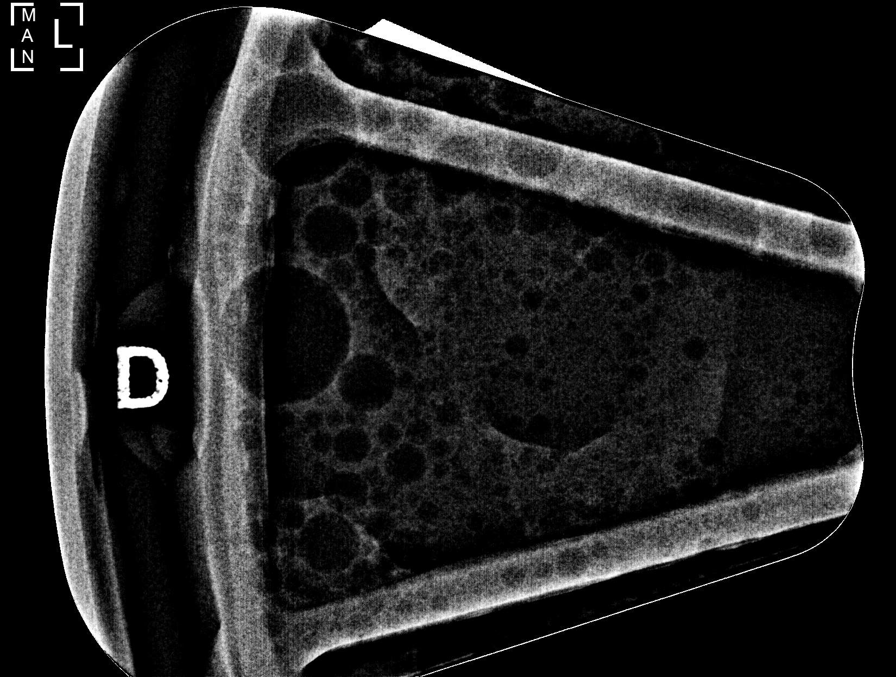

[L (3 of 8)]
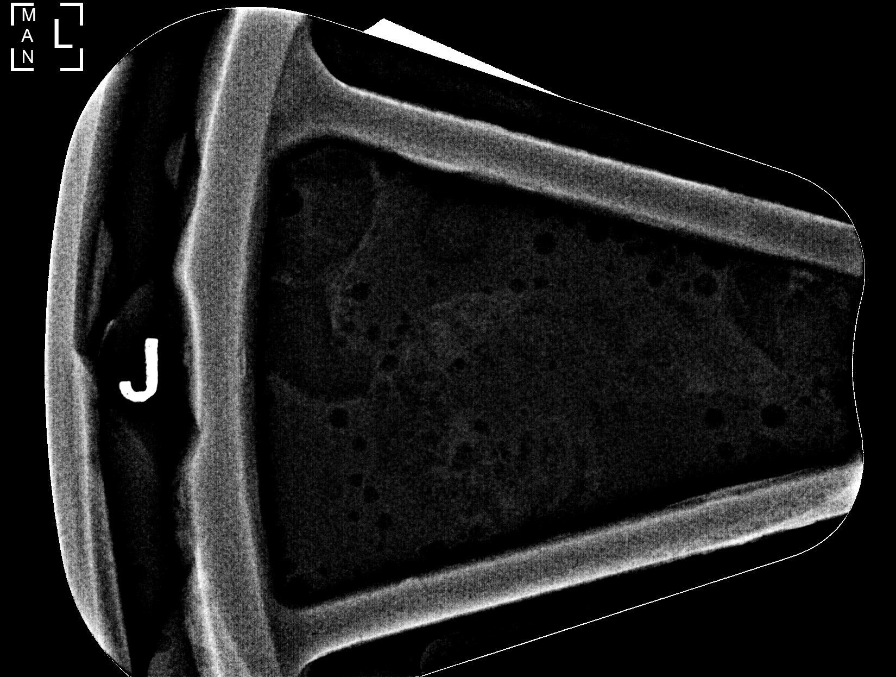

[L (4 of 8)]
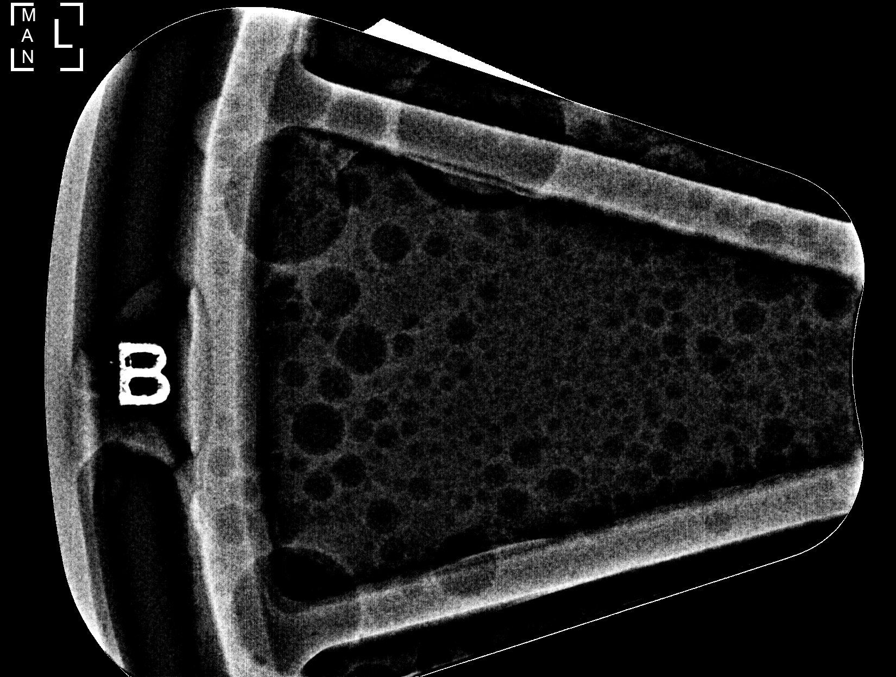

[L (5 of 8)]
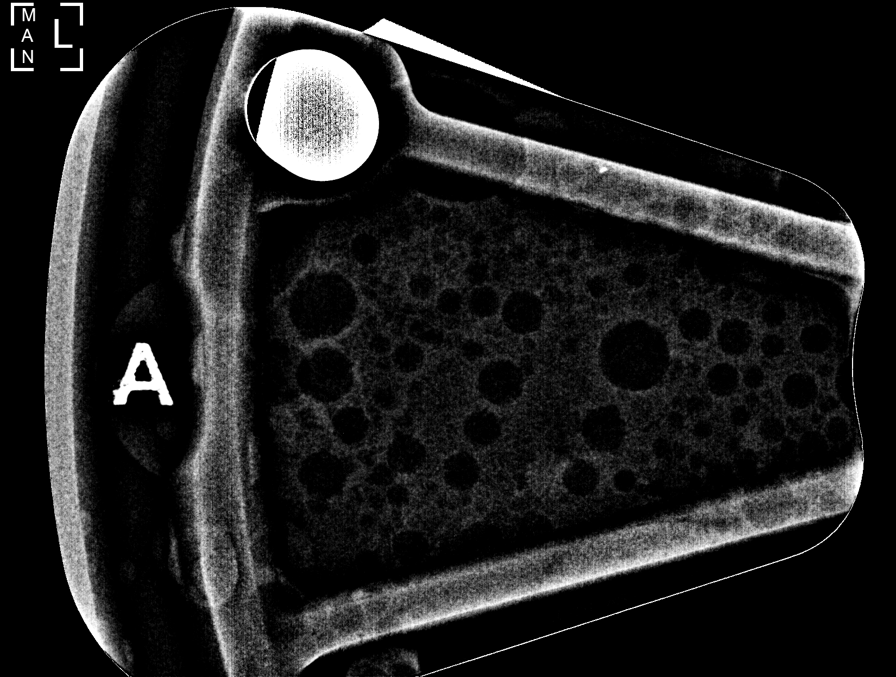

[L (6 of 8)]
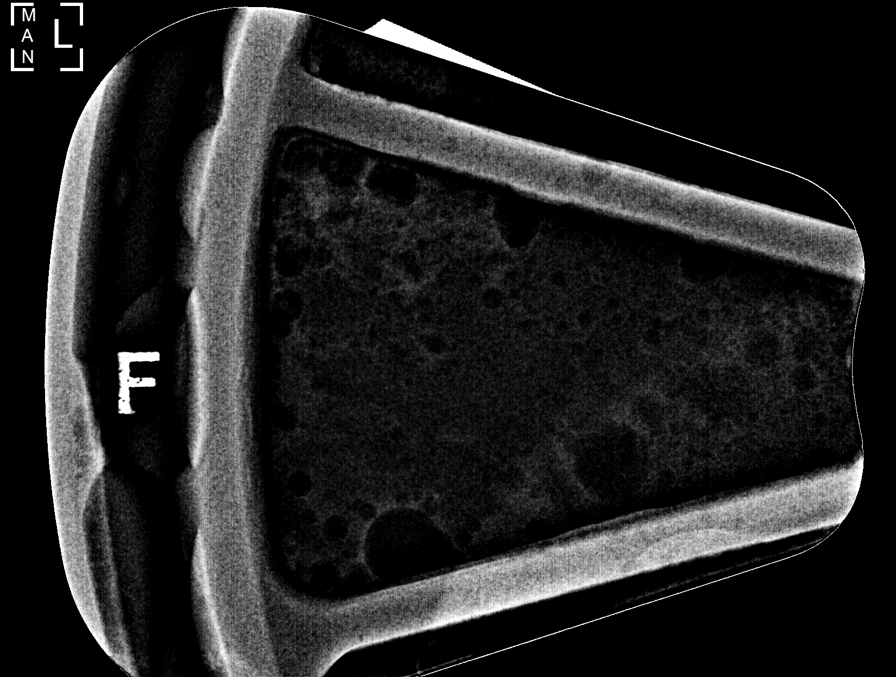

[L (7 of 8)]
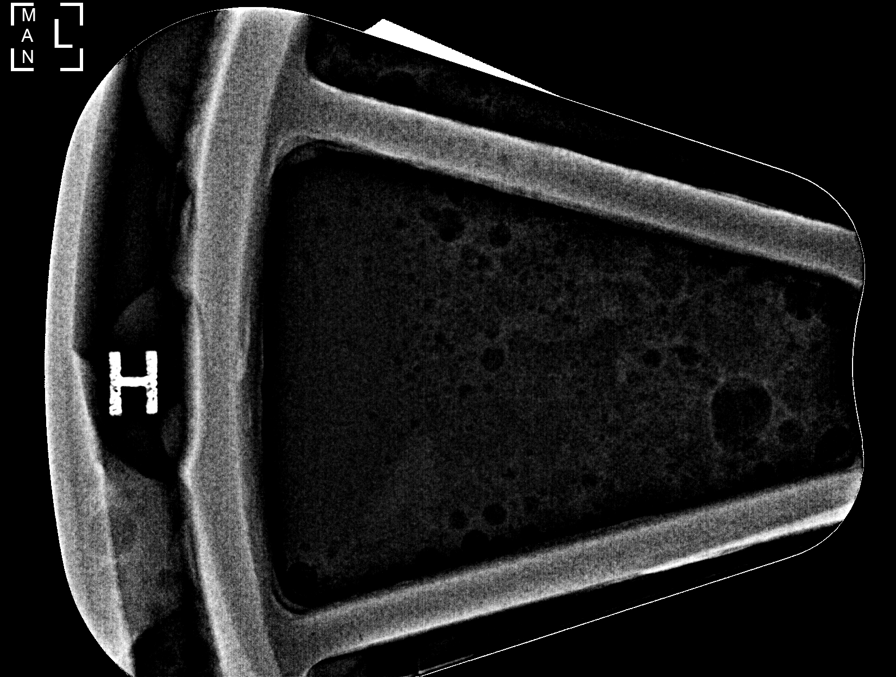

[L (8 of 8)]
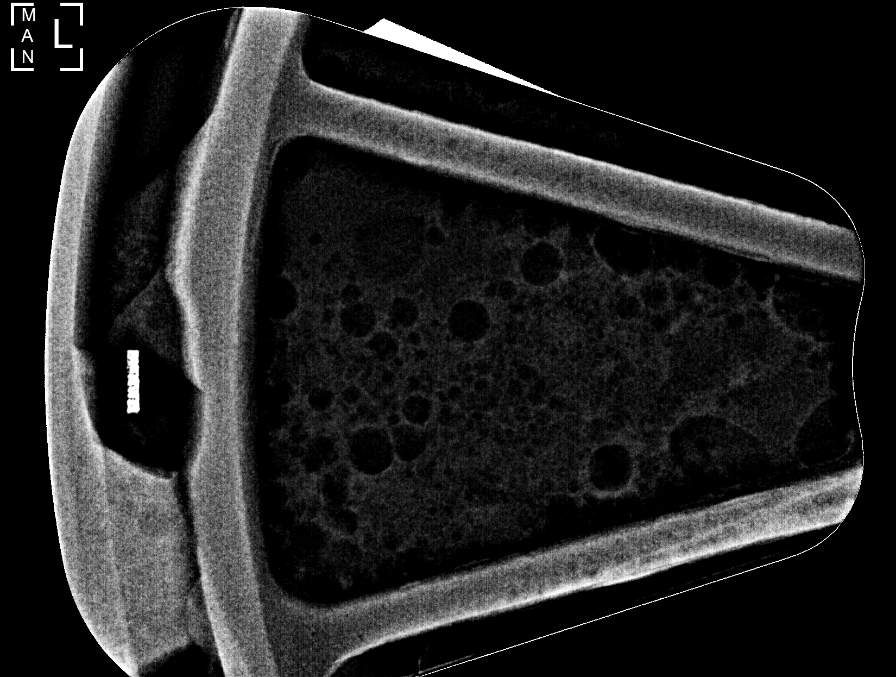

[8 of 32 positions shown; findings below may reference images not displayed]

FINDINGS: The patient and I discussed the procedure of stereotactic/
tomosynthesis -guided biopsy including benefits and alternatives. We
discussed the high likelihood of a successful procedure. We
discussed the risks of the procedure including infection, bleeding,
tissue injury, clip migration, and inadequate sampling. Informed
written consent was given. The usual time out protocol was performed
immediately prior to the procedure.

Using sterile technique and 1% Lidocaine as local anesthetic, under
stereotactic guidance, a 9 gauge vacuum assist device was used to
perform core needle biopsy of a distortion in the posterior upper,
inner left breast using a superior to inferior approach. Specimen
radiograph was performed.

Lesion quadrant: Upper inner quadrant

At the conclusion of the procedure, a cylinder-shaped tissue marker
clip was deployed into the biopsy cavity. Follow-up 2-view mammogram
was performed and dictated separately.
IMPRESSION: Stereotactic/ tomosynthesis -guided biopsy of a distortion in the
posterior upper, inner left breast. No apparent complications.

## 2018-05-15 DIAGNOSIS — R011 Cardiac murmur, unspecified: Secondary | ICD-10-CM | POA: Diagnosis not present

## 2018-05-15 DIAGNOSIS — I10 Essential (primary) hypertension: Secondary | ICD-10-CM | POA: Diagnosis not present

## 2018-05-15 DIAGNOSIS — K219 Gastro-esophageal reflux disease without esophagitis: Secondary | ICD-10-CM | POA: Diagnosis not present

## 2018-07-27 DIAGNOSIS — E785 Hyperlipidemia, unspecified: Secondary | ICD-10-CM | POA: Diagnosis not present

## 2018-07-27 DIAGNOSIS — I10 Essential (primary) hypertension: Secondary | ICD-10-CM | POA: Diagnosis not present

## 2018-07-27 DIAGNOSIS — E039 Hypothyroidism, unspecified: Secondary | ICD-10-CM | POA: Diagnosis not present

## 2018-07-27 DIAGNOSIS — K219 Gastro-esophageal reflux disease without esophagitis: Secondary | ICD-10-CM | POA: Diagnosis not present

## 2018-08-14 DIAGNOSIS — E119 Type 2 diabetes mellitus without complications: Secondary | ICD-10-CM | POA: Diagnosis not present

## 2018-08-14 DIAGNOSIS — K219 Gastro-esophageal reflux disease without esophagitis: Secondary | ICD-10-CM | POA: Diagnosis not present

## 2018-08-14 DIAGNOSIS — E7800 Pure hypercholesterolemia, unspecified: Secondary | ICD-10-CM | POA: Diagnosis not present

## 2018-08-14 DIAGNOSIS — E785 Hyperlipidemia, unspecified: Secondary | ICD-10-CM | POA: Diagnosis not present

## 2018-08-14 DIAGNOSIS — E039 Hypothyroidism, unspecified: Secondary | ICD-10-CM | POA: Diagnosis not present

## 2018-08-14 DIAGNOSIS — I10 Essential (primary) hypertension: Secondary | ICD-10-CM | POA: Diagnosis not present

## 2018-08-29 ENCOUNTER — Ambulatory Visit: Payer: Managed Care, Other (non HMO) | Admitting: General Surgery

## 2018-10-05 ENCOUNTER — Ambulatory Visit: Payer: Managed Care, Other (non HMO) | Admitting: General Surgery

## 2018-10-19 ENCOUNTER — Ambulatory Visit: Payer: Managed Care, Other (non HMO) | Admitting: General Surgery

## 2018-11-14 DIAGNOSIS — E785 Hyperlipidemia, unspecified: Secondary | ICD-10-CM | POA: Diagnosis not present

## 2018-11-14 DIAGNOSIS — K219 Gastro-esophageal reflux disease without esophagitis: Secondary | ICD-10-CM | POA: Diagnosis not present

## 2018-11-14 DIAGNOSIS — M129 Arthropathy, unspecified: Secondary | ICD-10-CM | POA: Diagnosis not present

## 2018-11-15 DIAGNOSIS — E7800 Pure hypercholesterolemia, unspecified: Secondary | ICD-10-CM | POA: Diagnosis not present

## 2018-11-15 DIAGNOSIS — E119 Type 2 diabetes mellitus without complications: Secondary | ICD-10-CM | POA: Diagnosis not present

## 2019-01-30 ENCOUNTER — Encounter: Payer: Self-pay | Admitting: *Deleted

## 2019-02-14 DIAGNOSIS — E039 Hypothyroidism, unspecified: Secondary | ICD-10-CM | POA: Diagnosis not present

## 2019-02-14 DIAGNOSIS — E785 Hyperlipidemia, unspecified: Secondary | ICD-10-CM | POA: Diagnosis not present

## 2019-02-14 DIAGNOSIS — K219 Gastro-esophageal reflux disease without esophagitis: Secondary | ICD-10-CM | POA: Diagnosis not present

## 2019-02-14 DIAGNOSIS — E7800 Pure hypercholesterolemia, unspecified: Secondary | ICD-10-CM | POA: Diagnosis not present

## 2019-02-14 DIAGNOSIS — R5383 Other fatigue: Secondary | ICD-10-CM | POA: Diagnosis not present

## 2019-02-14 DIAGNOSIS — I10 Essential (primary) hypertension: Secondary | ICD-10-CM | POA: Diagnosis not present

## 2019-04-29 DIAGNOSIS — R05 Cough: Secondary | ICD-10-CM | POA: Diagnosis not present

## 2019-05-09 ENCOUNTER — Encounter (HOSPITAL_COMMUNITY): Payer: Self-pay

## 2019-05-09 ENCOUNTER — Emergency Department (HOSPITAL_COMMUNITY): Payer: No Typology Code available for payment source

## 2019-05-09 ENCOUNTER — Other Ambulatory Visit: Payer: Self-pay

## 2019-05-09 ENCOUNTER — Emergency Department (HOSPITAL_COMMUNITY)
Admission: EM | Admit: 2019-05-09 | Discharge: 2019-05-09 | Disposition: A | Discharge status: Home / Self Care | Payer: No Typology Code available for payment source | Attending: Emergency Medicine | Admitting: Emergency Medicine

## 2019-05-09 DIAGNOSIS — Z79899 Other long term (current) drug therapy: Secondary | ICD-10-CM | POA: Diagnosis not present

## 2019-05-09 DIAGNOSIS — I10 Essential (primary) hypertension: Secondary | ICD-10-CM | POA: Insufficient documentation

## 2019-05-09 DIAGNOSIS — E1165 Type 2 diabetes mellitus with hyperglycemia: Secondary | ICD-10-CM | POA: Diagnosis not present

## 2019-05-09 DIAGNOSIS — Z853 Personal history of malignant neoplasm of breast: Secondary | ICD-10-CM | POA: Diagnosis not present

## 2019-05-09 DIAGNOSIS — U071 COVID-19: Secondary | ICD-10-CM | POA: Insufficient documentation

## 2019-05-09 DIAGNOSIS — E039 Hypothyroidism, unspecified: Secondary | ICD-10-CM | POA: Diagnosis not present

## 2019-05-09 DIAGNOSIS — R05 Cough: Secondary | ICD-10-CM | POA: Diagnosis present

## 2019-05-09 DIAGNOSIS — I2510 Atherosclerotic heart disease of native coronary artery without angina pectoris: Secondary | ICD-10-CM | POA: Diagnosis not present

## 2019-05-09 DIAGNOSIS — Z7984 Long term (current) use of oral hypoglycemic drugs: Secondary | ICD-10-CM | POA: Diagnosis not present

## 2019-05-09 DIAGNOSIS — R0902 Hypoxemia: Secondary | ICD-10-CM | POA: Diagnosis not present

## 2019-05-09 DIAGNOSIS — E119 Type 2 diabetes mellitus without complications: Secondary | ICD-10-CM | POA: Diagnosis not present

## 2019-05-09 DIAGNOSIS — R079 Chest pain, unspecified: Secondary | ICD-10-CM | POA: Diagnosis not present

## 2019-05-09 DIAGNOSIS — R0789 Other chest pain: Secondary | ICD-10-CM | POA: Diagnosis not present

## 2019-05-09 LAB — CBC
HCT: 35.5 % — ABNORMAL LOW (ref 36.0–46.0)
Hemoglobin: 11.5 g/dL — ABNORMAL LOW (ref 12.0–15.0)
MCH: 28.6 pg (ref 26.0–34.0)
MCHC: 32.4 g/dL (ref 30.0–36.0)
MCV: 88.3 fL (ref 80.0–100.0)
Platelets: 142 10*3/uL — ABNORMAL LOW (ref 150–400)
RBC: 4.02 MIL/uL (ref 3.87–5.11)
RDW: 13.5 % (ref 11.5–15.5)
WBC: 5.4 10*3/uL (ref 4.0–10.5)
nRBC: 0 % (ref 0.0–0.2)

## 2019-05-09 LAB — BASIC METABOLIC PANEL
Anion gap: 9 (ref 5–15)
BUN: 20 mg/dL (ref 8–23)
CO2: 19 mmol/L — ABNORMAL LOW (ref 22–32)
Calcium: 7.5 mg/dL — ABNORMAL LOW (ref 8.9–10.3)
Chloride: 110 mmol/L (ref 98–111)
Creatinine, Ser: 1.12 mg/dL — ABNORMAL HIGH (ref 0.44–1.00)
GFR calc Af Amer: 55 mL/min — ABNORMAL LOW (ref >60)
GFR calc non Af Amer: 47 mL/min — ABNORMAL LOW (ref >60)
Glucose, Bld: 128 mg/dL — ABNORMAL HIGH (ref 70–99)
Potassium: 4.2 mmol/L (ref 3.5–5.1)
Sodium: 138 mmol/L (ref 135–145)

## 2019-05-09 LAB — TROPONIN I (HIGH SENSITIVITY)
Troponin I (High Sensitivity): 28 ng/L — ABNORMAL HIGH (ref <18)
Troponin I (High Sensitivity): 33 ng/L — ABNORMAL HIGH (ref <18)

## 2019-05-09 MED ORDER — SODIUM CHLORIDE 0.9 % IV BOLUS
1000.0000 mL | Freq: Once | INTRAVENOUS | Status: AC
  Administered 2019-05-09: 1000 mL via INTRAVENOUS

## 2019-05-09 MED ORDER — ALBUTEROL SULFATE HFA 108 (90 BASE) MCG/ACT IN AERS: 1.0000 | INHALATION_SPRAY | Freq: Four times a day (QID) | RESPIRATORY_TRACT | 0 refills | Status: DC | PRN

## 2019-05-09 MED ORDER — IOHEXOL 350 MG/ML SOLN
75.0000 mL | Freq: Once | INTRAVENOUS | Status: AC | PRN
  Administered 2019-05-09: 75 mL via INTRAVENOUS

## 2019-05-09 MED ORDER — GUAIFENESIN-CODEINE 100-10 MG/5ML PO SOLN: 5.0000 mL | Freq: Three times a day (TID) | ORAL | 0 refills | Status: AC | PRN

## 2019-05-09 MED ORDER — GUAIFENESIN-CODEINE 100-10 MG/5ML PO SOLN
5.0000 mL | Freq: Once | ORAL | Status: AC
  Administered 2019-05-09: 5 mL via ORAL
  Filled 2019-05-09: qty 5

## 2019-05-09 NOTE — ED Notes (Signed)
Patient verbalizes understanding of discharge instructions. Opportunity for questioning and answers were provided. Armband removed by staff, pt discharged from ED ambulatory.   

## 2019-05-09 NOTE — ED Provider Notes (Signed)
[place] EMERGENCY DEPARTMENT Provider Note   CSN: [id number] Arrival date & time: 05/09/19  1724     History Chief Complaint  Patient presents with  . Chest Pain    Tammy Cannon is a 78 y.o. female.  Patient is a 78 year old female with past medical history of breast cancer status post bilateral mastectomy, coronary artery disease, diabetes, hypothyroidism recently diagnosed with COVID-19 on the 13th of this month presenting to the emergency department with cough and chest pain.  Patient reports that she actually does feel little bit improved in regards to her Covid symptoms and cough but continues to have a cough.  Reports waxing and waning chest pressure in the center of her chest which has been going on for a "long time".  Currently not experiencing any chest pain here in the ER.  The chest pain is not exertional.  It is in the center of her chest and occasionally radiates to her back.  Denies any fever, shortness of breath.  Reports that she thinks she is dehydrated because she has had some vomiting and diarrhea and has not been drinking much liquids.  Reports that she has had the same chest pain before in the past a couple of years ago and was evaluated for it but does not remember if she was diagnosed with anything.  Denies any history of heart attack.  Denies any leg swelling.        Past Medical History:  Diagnosis Date  . Breast cancer [facility]) 1989   Right mastectomy /pos node [name] /Dr Cerame  . Cardiomegaly   . Complication of anesthesia    with colonoscopy  . Coronary atherosclerosis of native coronary artery   . DCIS (ductal carcinoma in situ) 08/19/2016   left breast  . Diabetes mellitus without complication ([place])   . GERD (gastroesophageal reflux disease)   . History of kidney stones   . Hypothyroidism   . Other and unspecified hyperlipidemia   . Other chronic pulmonary heart diseases   . Panic attacks   . PONV (postoperative nausea and  vomiting)   . Unspecified essential hypertension     Patient Active Problem List   Diagnosis Date Noted  . Chronic venous insufficiency 08/25/2017  . Ductal carcinoma in situ (DCIS) of left breast 08/26/2016  . Radial scar of breast 08/26/2016  . HYPERLIPIDEMIA-MIXED 03/05/2009  . HYPERTENSION, UNSPECIFIED 03/05/2009  . CAD, NATIVE VESSEL 03/05/2009  . PULMONARY HYPERTENSION 03/05/2009  . VENTRICULAR HYPERTROPHY, LEFT 03/05/2009    Past Surgical History:  Procedure Laterality Date  . ABDOMINAL HYSTERECTOMY    . BREAST BIOPSY Left 08/19/2016   DUCTAL CARCINOMA IN SITU (DCIS) INVOLVING A SCLEROSING LESION  . CHOLECYSTECTOMY    . lumpectomy (otheR)    . MASTECTOMY Right   . SENTINEL NODE BIOPSY Left 09/13/2016   Procedure: SENTINEL NODE BIOPSY;  Surgeon: [name], MD;  Location: [facility] ORS;  Service: General;  Laterality: Left;  . SIMPLE MASTECTOMY WITH AXILLARY SENTINEL NODE BIOPSY Left 09/13/2016   Procedure: SIMPLE MASTECTOMY;  Surgeon: [name], MD;  Location: [facility] ORS;  Service: General;  Laterality: Left;     OB History    Gravida  1   Para  1   Term      Preterm      AB      Living        SAB      TAB      Ectopic  Multiple      Live Births           Obstetric Comments  1st Menstrual Cycle:  13  1st Pregnancy:  49         Family History  Problem Relation Age of Onset  . Breast cancer Sister     Social History   Tobacco Use  . Smoking status: Never Smoker  . Smokeless tobacco: Never Used  Substance Use Topics  . Alcohol use: No  . Drug use: No    Home Medications Prior to Admission medications   Medication Sig Start Date End Date Taking? Authorizing Provider  albuterol (VENTOLIN HFA) 108 (90 Base) MCG/ACT inhaler Inhale 1-2 puffs into the lungs every 6 (six) hours as needed for wheezing or shortness of breath. 05/09/19   Alveria Apley, PA-C  cholecalciferol (VITAMIN D) 1000 units tablet Take 1,000 Units by mouth  daily.    [provider]  doxycycline (VIBRA-TABS) 100 MG tablet Take 1 tablet (100 mg total) by mouth 2 (two) times daily. 09/12/17   Hyatt, Max T, DPM  guaiFENesin-codeine 100-10 MG/5ML syrup Take 5 mLs by mouth 3 (three) times daily as needed for up to 3 days for cough. 05/09/19 05/12/19  Madilyn Hook A, PA-C  Iron-Vitamin C (IRON 100/C PO) Take by mouth.    [provider]  levothyroxine (SYNTHROID, LEVOTHROID) 88 MCG tablet Take 88 mcg by mouth daily before breakfast.     [provider]  metFORMIN (GLUCOPHAGE) 1000 MG tablet Take 1,000 mg by mouth 2 (two) times daily with a meal.    [provider]  neomycin-polymyxin-hydrocortisone (CORTISPORIN) otic solution Apply one to two drops to toe after soaking twice daily. 09/27/16   Hyatt, Max T, DPM  pantoprazole (PROTONIX) 40 MG tablet Take 40 mg by mouth every morning.     [provider]  pravastatin (PRAVACHOL) 40 MG tablet Take 40 mg by mouth every evening.     [provider]  trandolapril (MAVIK) 2 MG tablet Take 2 mg by mouth every evening.     [provider]  traZODone (DESYREL) 50 MG tablet Take 50 mg by mouth at bedtime.     [provider]  verapamil (VERELAN PM) 240 MG 24 hr capsule Take 240 mg by mouth at bedtime.     [provider]    Allergies    Patient has no known allergies.  Review of Systems   Review of Systems  Constitutional: Positive for appetite change and fatigue. Negative for chills and fever.  HENT: Negative for congestion and sore throat.   Respiratory: Positive for cough. Negative for shortness of breath, wheezing and stridor.   Cardiovascular: Positive for chest pain. Negative for palpitations and leg swelling.  Gastrointestinal: Positive for nausea and vomiting. Negative for abdominal pain, constipation and rectal pain.  Genitourinary: Negative for dysuria.  Musculoskeletal: Negative for myalgias.  Skin: Negative for rash.    Neurological: Negative for dizziness and light-headedness.  Hematological: Does not bruise/bleed easily.    Physical Exam Updated Vital Signs BP (!) 165/75   Pulse 81   Resp (!) 23   Ht 5\' 3"  (1.6 m)   Wt 59 kg   SpO2 92%   BMI 23.03 kg/m   Physical Exam Vitals and nursing note reviewed.  Constitutional:      Appearance: Normal appearance.  HENT:     Head: Normocephalic.  Eyes:     Conjunctiva/sclera: Conjunctivae normal.     Pupils:  Pupils are equal, round, and reactive to light.  Cardiovascular:     Rate and Rhythm: Normal rate and regular rhythm.  Pulmonary:     Effort: Pulmonary effort is normal. No accessory muscle usage or respiratory distress.     Breath sounds: No decreased breath sounds, wheezing or rhonchi.  Chest:     Comments: Large midline surgical scar, TTP Abdominal:     Palpations: Abdomen is soft.     Tenderness: There is no abdominal tenderness. There is no guarding.  Skin:    General: Skin is warm and dry.     Capillary Refill: Capillary refill takes less than 2 seconds.  Neurological:     Mental Status: She is alert.  Psychiatric:        Mood and Affect: Mood normal.     ED Results / Procedures / Treatments   Labs (all labs ordered are listed, but only abnormal results are displayed) Labs Reviewed  BASIC METABOLIC PANEL - Abnormal; Notable for the following components:      Result Value   CO2 19 (*)    Glucose, Bld 128 (*)    Creatinine, Ser 1.12 (*)    Calcium 7.5 (*)    GFR calc non Af Amer 47 (*)    GFR calc Af Amer 55 (*)    All other components within normal limits  CBC - Abnormal; Notable for the following components:   Hemoglobin 11.5 (*)    HCT 35.5 (*)    Platelets 142 (*)    All other components within normal limits  TROPONIN I (HIGH SENSITIVITY) - Abnormal; Notable for the following components:   Troponin I (High Sensitivity) 28 (*)    All other components within normal limits  TROPONIN I (HIGH SENSITIVITY) - Abnormal;  Notable for the following components:   Troponin I (High Sensitivity) 33 (*)    All other components within normal limits    EKG None  Radiology CT Angio Chest PE W and/or Wo Contrast  Result Date: 05/09/2019 CLINICAL DATA:  Chest pain, COVID positive EXAM: CT ANGIOGRAPHY CHEST WITH CONTRAST TECHNIQUE: Multidetector CT imaging of the chest was performed using the standard protocol during bolus administration of intravenous contrast. Multiplanar CT image reconstructions and MIPs were obtained to evaluate the vascular anatomy. CONTRAST:  41mL OMNIPAQUE IOHEXOL 350 MG/ML SOLN COMPARISON:  Radiograph same day FINDINGS: Cardiovascular: There is a optimal opacification of the pulmonary arteries. There is no central,segmental, or subsegmental filling defects within the pulmonary arteries. There is mild cardiomegaly. No evidence of right ventricular heart strain. There is normal three-vessel brachiocephalic anatomy without proximal stenosis. The thoracic aorta is normal in appearance. Mediastinum/Nodes: No hilar, mediastinal, or axillary adenopathy. Thyroid gland, trachea, and esophagus demonstrate no significant findings. Lungs/Pleura: Patchy ground-glass opacities are seen at the periphery of the bilateral lungs. This is predominantly within the lower lobes. No pleural effusion or pneumothorax. Upper Abdomen: A small to moderate hiatal hernia is present. The patient is status post cholecystectomy. Musculoskeletal: No chest wall abnormality. No acute or significant osseous findings. Chronic compression deformity is noted of the T11 vertebral body with 30% loss in anterior vertebral body height. Review of the MIP images confirms the above findings. IMPRESSION: No central, segmental, or subsegmental pulmonary embolism. Multifocal patchy/ground-glass opacities throughout both lungs, predominantly within the lower lobes, consistent with COVID pneumonia. Electronically Signed   By: Prudencio Pair M.D.   On: 05/09/2019  22:11   DG Chest Portable 1 View  Result Date: 05/09/2019 CLINICAL  DATA:  Chest pain EXAM: PORTABLE CHEST 1 VIEW COMPARISON:  06/08/2008 chest radiograph. FINDINGS: Stable cardiomediastinal silhouette with normal heart size. No pneumothorax. No pleural effusion. Low lung volumes. No pulmonary edema. Faint hazy opacity in peripheral and basilar lungs bilaterally. IMPRESSION: Low lung volumes. Faint hazy opacity in the peripheral and basilar lungs bilaterally, which could represent atypical/viral pneumonia or hypoventilatory change. Suggest follow-up PA and lateral chest radiographs. Electronically Signed   By: Ilona Sorrel M.D.   On: 05/09/2019 18:40    Procedures Procedures (including critical care time)  Medications Ordered in ED Medications  sodium chloride 0.9 % bolus 1,000 mL (0 mLs Intravenous Stopped 05/09/19 2044)  guaiFENesin-codeine 100-10 MG/5ML solution 5 mL (5 mLs Oral Given 05/09/19 1848)  iohexol (OMNIPAQUE) 350 MG/ML injection 75 mL (75 mLs Intravenous Contrast Given 05/09/19 2154)    ED Course  I have reviewed the triage vital signs and the nursing notes.  Pertinent labs & imaging results that were available during my care of the patient were reviewed by me and considered in my medical decision making (see chart for details).  Clinical Course as of May 08 2228  Wed May 09, 2019  1823 Elderly patient covid 19+ 10 days ago presenting with intermittant atypical sounding chest pain for "a long time". Appears well on initial exam with dry cough present. Oxygen sats between 93-95% on RA. Patient will be worked up for her chest pain including trop as well as CTA of chest to r/o PE. Will give fluids due to poor PO intake recently and codeine cough syrup at the request of the patient.   [KM]  2224 Patient chest pain resolved with codeine cough syrup. Workup revealed CTA consistent with covid but no PE. She had flat/stable troponins at 28 and 33. May have a degree of myocarditis from  the pneumonia. Patient continues to deny SOB. Discussed admission vs discharge with patient and she prefers discharge "as long as I get medicine for the coughing". Will discharge with codiene cough syrup. Advised on strict return precautions. Plan discussed with Dr. Alvino Chapel and agreed upon prior to d/c   [KM]    Clinical Course User Index [KM] Kristine Royal   MDM Rules/Calculators/A&P                      Based on review of vitals, medical screening exam, lab work and/or imaging, there does not appear to be an acute, emergent etiology for the patient's symptoms. Counseled pt on good return precautions and encouraged both PCP and ED follow-up as needed.  Prior to discharge, I also discussed incidental imaging findings with patient in detail and advised appropriate, recommended follow-up in detail.  Clinical Impression: 1. COVID-19     Disposition: Discharge  Prior to providing a prescription for a controlled substance, I independently reviewed the patient's recent prescription history on the [place]. The patient had no recent or regular prescriptions and was deemed appropriate for a brief, less than 3 day prescription of narcotic for acute analgesia.  This note was prepared with assistance of Systems analyst. Occasional wrong-word or sound-a-like substitutions may have occurred due to the inherent limitations of voice recognition software.  Final Clinical Impression(s) / ED Diagnoses Final diagnoses:  [id number]    Rx / DC Orders ED Discharge Orders         Ordered    guaiFENesin-codeine 100-10 MG/5ML syrup  3 times daily PRN  05/09/19 2227    albuterol (VENTOLIN HFA) 108 (90 Base) MCG/ACT inhaler  Every 6 hours PRN     05/09/19 2227           Kristine Royal 05/09/19 2229    Davonna Belling, MD 05/10/19 [phone]

## 2019-05-09 NOTE — Discharge Instructions (Addendum)
You were seen today for cough and chest pain with covid 19. Your labs were reassuring. We monitored your oxygen and vital signs and they all remained normal which is also very reassuring. We hydrated you with IV fluids and gave you cough medication. I have sent you a prescription for cough medication to the pharmacy as well as an inhaler you can use as needed. The cough medication may make you sleepy or drowsy so please be careful with it. You should follow up with your cardiologist and primary care provider. Thank you for allowing me to care for you today. Please return to the emergency department if you have new or worsening symptoms. Take your medications as instructed.

## 2019-05-09 NOTE — ED Triage Notes (Addendum)
Pt arrived via [facility] from home for c/o no appetite and not drinking 4-5d. Chills at night, dry cough, midsternal chest pain w/cough and inspiration. Pt COVID + dx 13 days ago. Diarrheax1wk on and off. Weakness. EMS gave 462ml NS. Pt A&Ox4.

## 2019-07-14 ENCOUNTER — Encounter: Payer: Self-pay | Admitting: Emergency Medicine

## 2019-07-14 ENCOUNTER — Emergency Department
Admission: EM | Admit: 2019-07-14 | Discharge: 2019-07-14 | Disposition: A | Discharge status: Home / Self Care | Payer: PRIVATE HEALTH INSURANCE | Attending: Emergency Medicine | Admitting: Emergency Medicine

## 2019-07-14 ENCOUNTER — Emergency Department: Payer: PRIVATE HEALTH INSURANCE

## 2019-07-14 ENCOUNTER — Other Ambulatory Visit: Payer: Self-pay

## 2019-07-14 DIAGNOSIS — R109 Unspecified abdominal pain: Secondary | ICD-10-CM | POA: Diagnosis not present

## 2019-07-14 DIAGNOSIS — I10 Essential (primary) hypertension: Secondary | ICD-10-CM | POA: Diagnosis not present

## 2019-07-14 DIAGNOSIS — Z7984 Long term (current) use of oral hypoglycemic drugs: Secondary | ICD-10-CM | POA: Diagnosis not present

## 2019-07-14 DIAGNOSIS — Z8616 Personal history of COVID-19: Secondary | ICD-10-CM | POA: Insufficient documentation

## 2019-07-14 DIAGNOSIS — E039 Hypothyroidism, unspecified: Secondary | ICD-10-CM | POA: Diagnosis not present

## 2019-07-14 DIAGNOSIS — Z79899 Other long term (current) drug therapy: Secondary | ICD-10-CM | POA: Insufficient documentation

## 2019-07-14 DIAGNOSIS — K5900 Constipation, unspecified: Secondary | ICD-10-CM | POA: Insufficient documentation

## 2019-07-14 DIAGNOSIS — N200 Calculus of kidney: Secondary | ICD-10-CM | POA: Insufficient documentation

## 2019-07-14 DIAGNOSIS — E119 Type 2 diabetes mellitus without complications: Secondary | ICD-10-CM | POA: Insufficient documentation

## 2019-07-14 DIAGNOSIS — M545 Low back pain: Secondary | ICD-10-CM | POA: Diagnosis not present

## 2019-07-14 DIAGNOSIS — R1031 Right lower quadrant pain: Secondary | ICD-10-CM | POA: Diagnosis present

## 2019-07-14 HISTORY — DX: COVID-19: U07.1

## 2019-07-14 LAB — CBC
HCT: 40.1 % (ref 36.0–46.0)
Hemoglobin: 13.1 g/dL (ref 12.0–15.0)
MCH: 29.4 pg (ref 26.0–34.0)
MCHC: 32.7 g/dL (ref 30.0–36.0)
MCV: 90.1 fL (ref 80.0–100.0)
Platelets: 202 10*3/uL (ref 150–400)
RBC: 4.45 MIL/uL (ref 3.87–5.11)
RDW: 14.3 % (ref 11.5–15.5)
WBC: 5.6 10*3/uL (ref 4.0–10.5)
nRBC: 0 % (ref 0.0–0.2)

## 2019-07-14 LAB — URINALYSIS, COMPLETE (UACMP) WITH MICROSCOPIC
Bacteria, UA: NONE SEEN
Bilirubin Urine: NEGATIVE
Glucose, UA: NEGATIVE mg/dL
Hgb urine dipstick: NEGATIVE
Ketones, ur: NEGATIVE mg/dL
Leukocytes,Ua: NEGATIVE
Nitrite: NEGATIVE
Protein, ur: NEGATIVE mg/dL
Specific Gravity, Urine: 1.018 (ref 1.005–1.030)
pH: 5 (ref 5.0–8.0)

## 2019-07-14 LAB — BASIC METABOLIC PANEL
Anion gap: 10 (ref 5–15)
BUN: 21 mg/dL (ref 8–23)
CO2: 24 mmol/L (ref 22–32)
Calcium: 9.5 mg/dL (ref 8.9–10.3)
Chloride: 104 mmol/L (ref 98–111)
Creatinine, Ser: 1.15 mg/dL — ABNORMAL HIGH (ref 0.44–1.00)
GFR calc Af Amer: 53 mL/min — ABNORMAL LOW (ref >60)
GFR calc non Af Amer: 46 mL/min — ABNORMAL LOW (ref >60)
Glucose, Bld: 206 mg/dL — ABNORMAL HIGH (ref 70–99)
Potassium: 3.7 mmol/L (ref 3.5–5.1)
Sodium: 138 mmol/L (ref 135–145)

## 2019-07-14 LAB — LIPASE, BLOOD: Lipase: 27 U/L (ref 11–51)

## 2019-07-14 MED ORDER — POLYETHYLENE GLYCOL 3350 17 G PO PACK: 17.0000 g | PACK | Freq: Two times a day (BID) | ORAL | 1 refills | Status: DC

## 2019-07-14 NOTE — ED Notes (Signed)
Patient transported to CT 

## 2019-07-14 NOTE — ED Triage Notes (Addendum)
Pt arrived via POV from [facility] with reports of right flank pain radiating down to lower abdomen. Pt states the pain has been present x 2 weeks and states the pain will shoot down her right leg as well.  Pt denies any N/V.  Pt rates the pain 8/10 on the pain scale. Pt reports not taking any medications for pain today.  Pt reports hx of kidney stones, but denies any urinary sxs and states pain does not resemble kidney stone pain in the past.  Pt states she had COVID in Nov 2020

## 2019-07-14 NOTE — ED Provider Notes (Signed)
[facility] Emergency Department Provider Note  ____________________________________________  Time seen: Approximately 1:03 PM  I have reviewed the triage vital signs and the nursing notes.   HISTORY  Chief Complaint Flank Pain    HPI Tammy Cannon is a 79 y.o. female who presents the emergency department for evaluation of right-sided pain.  Patient states that she had pain starting in the right lower quadrant that has now radiating to her right flank.  Patient states that she first noticed the pain approximately 2 weeks ago.  She is unable to clarify exactly what this feels like.  She is unable to determine if this is a sharp, shooting, stabbing, aching type pain.  She states that "it just hurts."  Patient denies any dysuria, polyuria, hematuria.   Patient denies any nausea, vomiting.  No diarrhea.  Patient states that she has a history of chronic constipation.  Patient has been constipated since the start of symptoms.  Patient also has a history of kidney stones "many many years ago."  Patient has a medical history described below with a history of breast cancer, diabetes, GERD, kidney stones, hypothyroidism, hyperlipidemia hypertension.        Past Medical History:  Diagnosis Date  . Breast cancer [facility]) 1989   Right mastectomy /pos node [name] /Dr Cerame  . Cardiomegaly   . Complication of anesthesia    with colonoscopy  . Coronary atherosclerosis of native coronary artery   . COVID-19   . DCIS (ductal carcinoma in situ) 08/19/2016   left breast  . Diabetes mellitus without complication ([place])   . GERD (gastroesophageal reflux disease)   . History of kidney stones   . Hypothyroidism   . Other and unspecified hyperlipidemia   . Other chronic pulmonary heart diseases   . Panic attacks   . PONV (postoperative nausea and vomiting)   . Unspecified essential hypertension     Patient Active Problem List   Diagnosis Date Noted  . Chronic venous  insufficiency 08/25/2017  . Ductal carcinoma in situ (DCIS) of left breast 08/26/2016  . Radial scar of breast 08/26/2016  . HYPERLIPIDEMIA-MIXED 03/05/2009  . HYPERTENSION, UNSPECIFIED 03/05/2009  . CAD, NATIVE VESSEL 03/05/2009  . PULMONARY HYPERTENSION 03/05/2009  . VENTRICULAR HYPERTROPHY, LEFT 03/05/2009    Past Surgical History:  Procedure Laterality Date  . ABDOMINAL HYSTERECTOMY    . BREAST BIOPSY Left 08/19/2016   DUCTAL CARCINOMA IN SITU (DCIS) INVOLVING A SCLEROSING LESION  . CHOLECYSTECTOMY    . lumpectomy (otheR)    . MASTECTOMY Right   . SENTINEL NODE BIOPSY Left 09/13/2016   Procedure: SENTINEL NODE BIOPSY;  Surgeon: [name], MD;  Location: [facility] ORS;  Service: General;  Laterality: Left;  . SIMPLE MASTECTOMY WITH AXILLARY SENTINEL NODE BIOPSY Left 09/13/2016   Procedure: SIMPLE MASTECTOMY;  Surgeon: [name], MD;  Location: [facility] ORS;  Service: General;  Laterality: Left;    Prior to Admission medications   Medication Sig Start Date End Date Taking? Authorizing Provider  albuterol (VENTOLIN HFA) 108 (90 Base) MCG/ACT inhaler Inhale 1-2 puffs into the lungs every 6 (six) hours as needed for wheezing or shortness of breath. 05/09/19   Alveria Apley, PA-C  cholecalciferol (VITAMIN D) 1000 units tablet Take 1,000 Units by mouth daily.    [provider]  doxycycline (VIBRA-TABS) 100 MG tablet Take 1 tablet (100 mg total) by mouth 2 (two) times daily. 09/12/17   Hyatt, Max T, DPM  Iron-Vitamin C (IRON 100/C PO)  Take by mouth.    [provider]  levothyroxine (SYNTHROID, LEVOTHROID) 88 MCG tablet Take 88 mcg by mouth daily before breakfast.     [provider]  metFORMIN (GLUCOPHAGE) 1000 MG tablet Take 1,000 mg by mouth 2 (two) times daily with a meal.    [provider]  neomycin-polymyxin-hydrocortisone (CORTISPORIN) otic solution Apply one to two drops to toe after soaking twice daily. 09/27/16   Hyatt, Max T, DPM   pantoprazole (PROTONIX) 40 MG tablet Take 40 mg by mouth every morning.     [provider]  polyethylene glycol (MIRALAX) 17 g packet Take 17 g by mouth 2 (two) times daily. Take twice daily until going regularly 07/14/19   Kemonie Cutillo, Charline Bills, PA-C  pravastatin (PRAVACHOL) 40 MG tablet Take 40 mg by mouth every evening.     [provider]  trandolapril (MAVIK) 2 MG tablet Take 2 mg by mouth every evening.     [provider]  traZODone (DESYREL) 50 MG tablet Take 50 mg by mouth at bedtime.     [provider]  verapamil (VERELAN PM) 240 MG 24 hr capsule Take 240 mg by mouth at bedtime.     [provider]    Allergies Patient has no known allergies.  Family History  Problem Relation Age of Onset  . Breast cancer Sister     Social History Social History   Tobacco Use  . Smoking status: Never Smoker  . Smokeless tobacco: Never Used  Substance Use Topics  . Alcohol use: No  . Drug use: No     Review of Systems  Constitutional: No fever/chills Eyes: No visual changes. No discharge ENT: No upper respiratory complaints. Cardiovascular: no chest pain. Respiratory: no cough. No SOB. Gastrointestinal: R flank radiating into R lower abd pain.  No nausea, no vomiting.  No diarrhea.  No constipation. Genitourinary: Negative for dysuria. No hematuria Musculoskeletal: Negative for musculoskeletal pain. Skin: Negative for rash, abrasions, lacerations, ecchymosis. Neurological: Negative for headaches, focal weakness or numbness. 10-point ROS otherwise negative.  ____________________________________________   PHYSICAL EXAM:  VITAL SIGNS: ED Triage Vitals  Enc Vitals Group     BP 07/14/19 1231 (!) 180/72     Pulse Rate 07/14/19 1231 69     Resp 07/14/19 1231 17     Temp 07/14/19 1231 97.7 F (36.5 C)     Temp Source 07/14/19 1231 Oral     SpO2 07/14/19 1231 96 %     Weight 07/14/19 1232 130 lb (59 kg)     Height 07/14/19 1232  5\' 3"  (1.6 m)     Head Circumference --      Peak Flow --      Pain Score 07/14/19 1243 8     Pain Loc --      Pain Edu? --      Excl. in [place]? --      Constitutional: Alert and oriented. Well appearing and in no acute distress. Eyes: Conjunctivae are normal. PERRL. EOMI. Head: Atraumatic. ENT:      Ears:       Nose: No congestion/rhinnorhea.      Mouth/Throat: Mucous membranes are moist.  Neck: No stridor.    Cardiovascular: Normal rate, regular rhythm. Normal S1 and S2.  Good peripheral circulation. Respiratory: Normal respiratory effort without tachypnea or retractions. Lungs CTAB. Good air entry to the bases with no decreased or absent breath sounds. Gastrointestinal: Bowel sounds 4 quadrants.  Soft to palpation all quadrants.  Patient is mildly tender to palpation starting in the inguinal region extending along the right lower quadrant, along the right side to the right flank region.  No point specific tenderness.  No palpable abnormalities.  No guarding or rigidity. No palpable masses. No distention. No CVA tenderness. Musculoskeletal: Full range of motion to all extremities. No gross deformities appreciated. Neurologic:  Normal speech and language. No gross focal neurologic deficits are appreciated.  Skin:  Skin is warm, dry and intact. No rash noted. Psychiatric: Mood and affect are normal. Speech and behavior are normal. Patient exhibits appropriate insight and judgement.   ____________________________________________   LABS (all labs ordered are listed, but only abnormal results are displayed)  Labs Reviewed  URINALYSIS, COMPLETE (UACMP) WITH MICROSCOPIC - Abnormal; Notable for the following components:      Result Value   Color, Urine YELLOW (*)    APPearance HAZY (*)    All other components within normal limits  BASIC METABOLIC PANEL - Abnormal; Notable for the following components:   Glucose, Bld 206 (*)    Creatinine, Ser 1.15 (*)    GFR calc non Af Amer 46 (*)     GFR calc Af Amer 53 (*)    All other components within normal limits  CBC  LIPASE, BLOOD   ____________________________________________  EKG   ____________________________________________  RADIOLOGY I personally viewed and evaluated these images as part of my medical decision making, as well as reviewing the written report by the radiologist.  CT Renal Stone Study  Result Date: 07/14/2019 CLINICAL DATA:  Right flank pain. History kidney stones. COVID in November of 2020. EXAM: CT ABDOMEN AND PELVIS WITHOUT CONTRAST TECHNIQUE: Multidetector CT imaging of the abdomen and pelvis was performed following the standard protocol without IV contrast. COMPARISON:  06/10/2008 abdominal MRI. FINDINGS: Lower chest: Clear lung bases. Mild cardiomegaly. Moderate hiatal hernia. Hepatobiliary: Normal noncontrast appearance of the liver. Cholecystectomy, without biliary ductal dilatation. Pancreas: Normal, without mass or ductal dilatation. Spleen: Normal in size, without focal abnormality. Adrenals/Urinary Tract: Normal adrenal glands. Mild renal cortical thinning bilaterally. 4 mm upper pole right renal collecting system calculus. 1.2 cm interpolar low-density right renal lesion, likely a cyst. No hydronephrosis. No hydroureter or ureteric calculi. No bladder calculi. Stomach/Bowel: Normal remainder of the stomach. Colonic stool burden suggests constipation. Normal terminal ileum. Appendix is not visualized but there is no evidence of right lower quadrant inflammation. Normal small bowel. Vascular/Lymphatic: Aortic atherosclerosis. No abdominopelvic adenopathy. Reproductive: Hysterectomy.  No adnexal mass. Other: No significant free fluid. Marked pelvic floor laxity. Low-density in the region the vaginal introitus may represent a Bartholin's gland cyst, including at 1.4 cm on 78/2. Musculoskeletal: Osteopenia. T11 compression deformity has been described previously. IMPRESSION: 1. Right nephrolithiasis.  No  obstructive uropathy. 2.  Possible constipation. 3. Marked pelvic floor laxity. Possible Bartholin's gland cyst of the vaginal introitus. Consider physical exam correlation. 4. Moderate hiatal hernia. 5.  Aortic Atherosclerosis (ICD10-I70.0). Electronically Signed   By: Abigail Miyamoto M.D.   On: 07/14/2019 13:59    ____________________________________________    PROCEDURES  Procedure(s) performed:    Procedures    Medications - No data to display   ____________________________________________   INITIAL IMPRESSION / ASSESSMENT AND PLAN / ED COURSE  Pertinent labs & imaging results that were available during my care of the patient were reviewed by me and considered in my medical decision making (see chart for details).  Review of the [place] CSRS was performed in accordance of the [place] prior to  dispensing any controlled drugs.           Patient's diagnosis is consistent with nephrolithiasis and constipation.  Patient presented to the emergency department complaining of right flank pain extending into his right lower abdomen.  Patient with history of chronic constipation.  She has been constipated since symptoms began.  Patient does also have a history of kidney stones.  CT scan revealed nephrolithiasis in the right kidney, constipation.  Patient will be placed on MiraLAX twice daily for constipation relief.  Drink plenty of fluids, over-the-counter medications as needed for flank pain and kidney stone.  Follow-up primary care..  Patient is given ED precautions to return to the ED for any worsening or new symptoms.     ____________________________________________  FINAL CLINICAL IMPRESSION(S) / ED DIAGNOSES  Final diagnoses:  Nephrolithiasis  Constipation, unspecified constipation type      NEW MEDICATIONS STARTED DURING THIS VISIT:  ED Discharge Orders         Ordered    polyethylene glycol (MIRALAX) 17 g packet  2 times daily     07/14/19 1705              This  chart was dictated using voice recognition software/Dragon. Despite best efforts to proofread, errors can occur which can change the meaning. Any change was purely unintentional.    Darletta Moll, PA-C 07/14/19 1706    Harvest Dark, MD 07/15/19 1242

## 2019-07-14 NOTE — ED Provider Notes (Signed)
-----------------------------------------   2:05 PM on 07/14/2019 -----------------------------------------  Patient seen in conjunction with physician assistant Betha Loa.  I have personally seen and evaluated the patient.  Fairly benign abdominal exam, work-up has been reassuring, CT scan consistent with constipation but otherwise negative for acute abnormality.  We will place patient on MiraLAX twice daily for constipation until the patient has 2 or 3 bowel movements.  Patient agreeable to plan of care.   Harvest Dark, MD 07/14/19 1407

## 2019-08-17 DIAGNOSIS — E785 Hyperlipidemia, unspecified: Secondary | ICD-10-CM | POA: Diagnosis not present

## 2019-08-17 DIAGNOSIS — K219 Gastro-esophageal reflux disease without esophagitis: Secondary | ICD-10-CM | POA: Diagnosis not present

## 2019-08-17 DIAGNOSIS — E039 Hypothyroidism, unspecified: Secondary | ICD-10-CM | POA: Diagnosis not present

## 2019-08-17 DIAGNOSIS — I10 Essential (primary) hypertension: Secondary | ICD-10-CM | POA: Diagnosis not present

## 2019-11-15 DIAGNOSIS — E039 Hypothyroidism, unspecified: Secondary | ICD-10-CM | POA: Diagnosis not present

## 2019-11-15 DIAGNOSIS — I10 Essential (primary) hypertension: Secondary | ICD-10-CM | POA: Diagnosis not present

## 2019-11-15 DIAGNOSIS — K219 Gastro-esophageal reflux disease without esophagitis: Secondary | ICD-10-CM | POA: Diagnosis not present

## 2019-11-20 ENCOUNTER — Ambulatory Visit: Payer: No Typology Code available for payment source

## 2019-11-20 ENCOUNTER — Telehealth: Payer: Self-pay | Admitting: General Practice

## 2019-11-20 NOTE — Telephone Encounter (Signed)
Spoke with patient's daughter Ms. Shon Baton to inform her that her Mom's appt was scheduled wrong..Due to her Mom's age(78), she needs to be seen by a provider, not by a [facility] scheduler Medical Assistant, due to risk factors that are involved  that a provider can explain and go over with patient in person. Daughter stated that her Mom took off from work and that her Mom has had 3 Colonoscopies in the past and didn't understand why her Mom couldn't be seen by the same Doctor she has an appt with on 11/20/19( Dr. Vicente Males). After speaking with Dr. Vicente Males, he confirmed that he could do a virtual/ Video with Ms. Crothers on 11/21/19 at 8:45am or 11/22/19 at 11am. I informed the daughter Ms. Watson of Dr. Georgeann Oppenheim availability. She did not confirm and stated that she will be bringing her mom with her to appt 11/20/19. I explained to Ms. Shon Baton that it will be fine if she brings her mom, but Ms.Hamil do not have an appt today.Marland Kitchen

## 2020-02-04 ENCOUNTER — Other Ambulatory Visit: Payer: Self-pay

## 2020-02-04 ENCOUNTER — Ambulatory Visit (INDEPENDENT_AMBULATORY_CARE_PROVIDER_SITE_OTHER): Payer: No Typology Code available for payment source | Admitting: Gastroenterology

## 2020-02-04 VITALS — BP 150/72 | HR 73 | Temp 97.8°F | Ht 64.0 in | Wt 133.0 lb

## 2020-02-04 DIAGNOSIS — Z800 Family history of malignant neoplasm of digestive organs: Secondary | ICD-10-CM

## 2020-02-04 MED ORDER — SUTAB 1479-225-188 MG PO TABS: ORAL_TABLET | ORAL | 0 refills | Status: DC

## 2020-02-04 NOTE — Progress Notes (Signed)
Jonathon Bellows MD, MRCP(U.K) [address]  [place]  [place], [place] [address]  Main: [phone]  Fax: [phone]   Gastroenterology Consultation  Referring Provider:     Lorelee Market, MD Primary Care Physician:  Lorelee Market, MD Primary Gastroenterologist:  Dr. Jonathon Bellows  Reason for Consultation:   Discuss about a colonoscopy        HPI:   Tammy Cannon is a 79 y.o. y/o female referred for consultation & management  by Dr. Lorelee Market, MD.     Last colonoscopy was in 02/03/2009.  She had internal hemorrhoids but no polyps.  Her sister had colon cancer.  She recollects she has had multiple colonoscopies in the past but only had a polyp on the first 1.  She works full-time very active has not had any cardiac issues like a heart attack in the past.  Has a good quality of life.  She suffers from longstanding constipation and occasional blood on the stool which she has had for many years.  Past Medical History:  Diagnosis Date  . Breast cancer [facility]) 1989   Right mastectomy /pos node [name] /Dr Cerame  . Cardiomegaly   . Complication of anesthesia    with colonoscopy  . Coronary atherosclerosis of native coronary artery   . COVID-19   . DCIS (ductal carcinoma in situ) 08/19/2016   left breast  . Diabetes mellitus without complication ([place])   . GERD (gastroesophageal reflux disease)   . History of kidney stones   . Hypothyroidism   . Other and unspecified hyperlipidemia   . Other chronic pulmonary heart diseases   . Panic attacks   . PONV (postoperative nausea and vomiting)   . Unspecified essential hypertension     Past Surgical History:  Procedure Laterality Date  . ABDOMINAL HYSTERECTOMY    . BREAST BIOPSY Left 08/19/2016   DUCTAL CARCINOMA IN SITU (DCIS) INVOLVING A SCLEROSING LESION  . CHOLECYSTECTOMY    . lumpectomy (otheR)    . MASTECTOMY Right   . SENTINEL NODE BIOPSY Left 09/13/2016   Procedure: SENTINEL NODE BIOPSY;  Surgeon:  [name], MD;  Location: [facility] ORS;  Service: General;  Laterality: Left;  . SIMPLE MASTECTOMY WITH AXILLARY SENTINEL NODE BIOPSY Left 09/13/2016   Procedure: SIMPLE MASTECTOMY;  Surgeon: [name], MD;  Location: [facility] ORS;  Service: General;  Laterality: Left;    Prior to Admission medications   Medication Sig Start Date End Date Taking? Authorizing Provider  albuterol (VENTOLIN HFA) 108 (90 Base) MCG/ACT inhaler Inhale 1-2 puffs into the lungs every 6 (six) hours as needed for wheezing or shortness of breath. 05/09/19   Alveria Apley, PA-C  cholecalciferol (VITAMIN D) 1000 units tablet Take 1,000 Units by mouth daily.    [provider]  doxycycline (VIBRA-TABS) 100 MG tablet Take 1 tablet (100 mg total) by mouth 2 (two) times daily. 09/12/17   Hyatt, Max T, DPM  Iron-Vitamin C (IRON 100/C PO) Take by mouth.    [provider]  levothyroxine (SYNTHROID, LEVOTHROID) 88 MCG tablet Take 88 mcg by mouth daily before breakfast.     [provider]  metFORMIN (GLUCOPHAGE) 1000 MG tablet Take 1,000 mg by mouth 2 (two) times daily with a meal.    [provider]  neomycin-polymyxin-hydrocortisone (CORTISPORIN) otic solution Apply one to two drops to toe after soaking twice daily. 09/27/16   Hyatt, Max T, DPM  pantoprazole (PROTONIX) 40 MG tablet Take 40 mg by mouth  every morning.     [provider]  polyethylene glycol (MIRALAX) 17 g packet Take 17 g by mouth 2 (two) times daily. Take twice daily until going regularly 07/14/19   Cuthriell, Charline Bills, PA-C  pravastatin (PRAVACHOL) 40 MG tablet Take 40 mg by mouth every evening.     [provider]  trandolapril (MAVIK) 2 MG tablet Take 2 mg by mouth every evening.     [provider]  traZODone (DESYREL) 50 MG tablet Take 50 mg by mouth at bedtime.     [provider]  verapamil (VERELAN PM) 240 MG 24 hr capsule Take 240 mg by mouth at bedtime.     [provider]    Family History  Problem Relation Age of Onset  . Breast cancer Sister      Social History   Tobacco Use  . Smoking status: Never Smoker  . Smokeless tobacco: Never Used  Substance Use Topics  . Alcohol use: No  . Drug use: No    Allergies as of 02/04/2020  . (No Known Allergies)    Review of Systems:    All systems reviewed and negative except where noted in HPI.   Physical Exam:  There were no vitals taken for this visit. No LMP recorded. Patient has had a hysterectomy. Psych:  Alert and cooperative. Normal mood and affect. General:   Alert,  Well-developed, well-nourished, pleasant and cooperative in NAD Head:  Normocephalic and atraumatic. Eyes:  Sclera clear, no icterus.   Conjunctiva pink. Lungs:  Respirations even and unlabored.  Clear throughout to auscultation.   No wheezes, crackles, or rhonchi. No acute distress. Heart:  Regular rate and rhythm; no murmurs, clicks, rubs, or gallops. Abdomen:  Normal bowel sounds.  No bruits.  Soft, non-tender and non-distended without masses, hepatosplenomegaly or hernias noted.  No guarding or rebound tenderness.    Neurologic:  Alert and oriented x3;  grossly normal neurologically. Psych:  Alert and cooperative. Normal mood and affect.  Imaging Studies: No results found.  Assessment and Plan:   Tammy Cannon is a 79 y.o. y/o female has been referred for a colonoscopy.  .  Some rectal bleeding which has been longstanding likely hemorrhoidal discussed the risks versus benefits of undergoing a colonoscopy, I hope he feels well soon her age and she decided to proceed with a colonoscopy.  She is quite active and likely has a more than 10 years expectancy of life and hence would not be unreasonable to proceed with a colonoscopy   I have discussed alternative options, risks & benefits,  which include, but are not limited to, bleeding, infection, perforation,respiratory complication & drug reaction.  The patient  agrees with this plan & written consent will be obtained.     Follow up in as needed  Dr Jonathon Bellows MD,MRCP(U.K)

## 2020-04-01 ENCOUNTER — Other Ambulatory Visit
Admission: RE | Admit: 2020-04-01 | Discharge: 2020-04-01 | Disposition: A | Discharge status: Home / Self Care | Payer: PRIVATE HEALTH INSURANCE | Source: Ambulatory Visit | Attending: Gastroenterology | Admitting: Gastroenterology

## 2020-04-01 ENCOUNTER — Other Ambulatory Visit: Payer: Self-pay

## 2020-04-01 DIAGNOSIS — Z20822 Contact with and (suspected) exposure to covid-19: Secondary | ICD-10-CM | POA: Diagnosis not present

## 2020-04-01 DIAGNOSIS — Z01812 Encounter for preprocedural laboratory examination: Secondary | ICD-10-CM | POA: Insufficient documentation

## 2020-04-01 LAB — SARS CORONAVIRUS 2 (TAT 6-24 HRS): SARS Coronavirus 2: NEGATIVE

## 2020-04-02 ENCOUNTER — Encounter: Payer: Self-pay | Admitting: Gastroenterology

## 2020-04-03 ENCOUNTER — Ambulatory Visit
Admission: RE | Admit: 2020-04-03 | Discharge: 2020-04-03 | Disposition: A | Discharge status: Home / Self Care | Payer: PRIVATE HEALTH INSURANCE | Attending: Gastroenterology | Admitting: Gastroenterology

## 2020-04-03 ENCOUNTER — Encounter
Admission: RE | Disposition: A | Discharge status: Home / Self Care | Payer: Self-pay | Source: Home / Self Care | Attending: Gastroenterology

## 2020-04-03 ENCOUNTER — Encounter: Payer: Self-pay | Admitting: Gastroenterology

## 2020-04-03 ENCOUNTER — Ambulatory Visit: Payer: PRIVATE HEALTH INSURANCE | Admitting: Certified Registered"

## 2020-04-03 ENCOUNTER — Other Ambulatory Visit: Payer: Self-pay

## 2020-04-03 DIAGNOSIS — E782 Mixed hyperlipidemia: Secondary | ICD-10-CM | POA: Diagnosis not present

## 2020-04-03 DIAGNOSIS — C182 Malignant neoplasm of ascending colon: Secondary | ICD-10-CM | POA: Diagnosis not present

## 2020-04-03 DIAGNOSIS — Z800 Family history of malignant neoplasm of digestive organs: Secondary | ICD-10-CM | POA: Diagnosis not present

## 2020-04-03 DIAGNOSIS — Z1211 Encounter for screening for malignant neoplasm of colon: Secondary | ICD-10-CM | POA: Diagnosis not present

## 2020-04-03 DIAGNOSIS — Z79890 Hormone replacement therapy (postmenopausal): Secondary | ICD-10-CM | POA: Diagnosis not present

## 2020-04-03 DIAGNOSIS — Z9221 Personal history of antineoplastic chemotherapy: Secondary | ICD-10-CM | POA: Diagnosis not present

## 2020-04-03 DIAGNOSIS — K635 Polyp of colon: Secondary | ICD-10-CM | POA: Diagnosis not present

## 2020-04-03 DIAGNOSIS — I10 Essential (primary) hypertension: Secondary | ICD-10-CM | POA: Diagnosis not present

## 2020-04-03 DIAGNOSIS — Z79899 Other long term (current) drug therapy: Secondary | ICD-10-CM | POA: Diagnosis not present

## 2020-04-03 DIAGNOSIS — Z853 Personal history of malignant neoplasm of breast: Secondary | ICD-10-CM | POA: Insufficient documentation

## 2020-04-03 DIAGNOSIS — D122 Benign neoplasm of ascending colon: Secondary | ICD-10-CM | POA: Diagnosis not present

## 2020-04-03 DIAGNOSIS — Z7984 Long term (current) use of oral hypoglycemic drugs: Secondary | ICD-10-CM | POA: Diagnosis not present

## 2020-04-03 DIAGNOSIS — Z8616 Personal history of COVID-19: Secondary | ICD-10-CM | POA: Diagnosis not present

## 2020-04-03 HISTORY — PX: COLONOSCOPY WITH PROPOFOL: SHX5780

## 2020-04-03 SURGERY — COLONOSCOPY WITH PROPOFOL
Anesthesia: General

## 2020-04-03 MED ORDER — PROPOFOL 500 MG/50ML IV EMUL
INTRAVENOUS | Status: DC | PRN
  Administered 2020-04-03: 145 ug/kg/min via INTRAVENOUS

## 2020-04-03 MED ORDER — PROPOFOL 10 MG/ML IV BOLUS
INTRAVENOUS | Status: DC | PRN
  Administered 2020-04-03: 40 mg via INTRAVENOUS

## 2020-04-03 MED ORDER — SODIUM CHLORIDE 0.9 % IV SOLN: INTRAVENOUS | Status: DC

## 2020-04-03 MED ORDER — HYDRALAZINE HCL 20 MG/ML IJ SOLN
INTRAMUSCULAR | Status: DC | PRN
  Administered 2020-04-03: 2.5 mg via INTRAVENOUS

## 2020-04-03 NOTE — Anesthesia Preprocedure Evaluation (Signed)
Anesthesia Evaluation  Patient identified by MRN, date of birth, ID band Patient awake    Reviewed: Allergy & Precautions, H&P , NPO status , Patient's Chart, lab work & pertinent test results  History of Anesthesia Complications Negative for: history of anesthetic complications  Airway Mallampati: II  TM Distance: <3 FB     Dental   Pulmonary neg pulmonary ROS, neg sleep apnea, neg COPD,           Cardiovascular hypertension, (-) angina+ CAD  (-) Past MI and (-) Cardiac Stents (-) dysrhythmias  Rhythm:regular Rate:Normal     Neuro/Psych PSYCHIATRIC DISORDERS Anxiety negative neurological ROS     GI/Hepatic Neg liver ROS, GERD  Controlled,  Endo/Other  diabetesHypothyroidism   Renal/GU negative Renal ROS  negative genitourinary   Musculoskeletal   Abdominal   Peds  Hematology negative hematology ROS (+)   Anesthesia Other Findings Past Medical History: 1989: Breast cancer ([place])     Comment:  Right mastectomy /pos node [name] /Dr Cerame No date: Cardiomegaly No date: Complication of anesthesia     Comment:  with colonoscopy No date: Coronary atherosclerosis of native coronary artery No date: COVID-19 08/19/2016: DCIS (ductal carcinoma in situ)     Comment:  left breast No date: Diabetes mellitus without complication ([facility]) No date: GERD (gastroesophageal reflux disease) No date: History of kidney stones No date: Hypothyroidism No date: Other and unspecified hyperlipidemia No date: Other chronic pulmonary heart diseases No date: Panic attacks No date: PONV (postoperative nausea and vomiting) No date: Unspecified essential hypertension  Past Surgical History: No date: ABDOMINAL HYSTERECTOMY 08/19/2016: BREAST BIOPSY; Left     Comment:  DUCTAL CARCINOMA IN SITU (DCIS) INVOLVING A SCLEROSING               LESION No date: CHOLECYSTECTOMY No date: lumpectomy (otheR) No date: MASTECTOMY; Right 09/13/2016:  SENTINEL NODE BIOPSY; Left     Comment:  Procedure: SENTINEL NODE BIOPSY;  Surgeon: [name], MD;  Location: [facility] ORS;  Service: General;                Laterality: Left; 09/13/2016: SIMPLE MASTECTOMY WITH AXILLARY SENTINEL NODE BIOPSY; Left     Comment:  Procedure: SIMPLE MASTECTOMY;  Surgeon: [name], MD;  Location: [facility] ORS;  Service: General;                Laterality: Left;  BMI    Body Mass Index: 23.03 kg/m      Reproductive/Obstetrics negative OB ROS                             Anesthesia Physical Anesthesia Plan  ASA: III  Anesthesia Plan: General   Post-op Pain Management:    Induction:   PONV Risk Score and Plan: Propofol infusion and TIVA  Airway Management Planned:   Additional Equipment:   Intra-op Plan:   Post-operative Plan:   Informed Consent: I have reviewed the patients History and Physical, chart, labs and discussed the procedure including the risks, benefits and alternatives for the proposed anesthesia with the patient or authorized representative who has indicated his/her understanding and acceptance.     Dental Advisory Given  Plan Discussed with: Anesthesiologist, CRNA and Surgeon  Anesthesia Plan Comments:  Anesthesia Quick Evaluation  

## 2020-04-03 NOTE — H&P (Signed)
Jonathon Bellows, MD [address] [place], [place], [place], [address] [address], [place], [place], [place], [address] Phone: [phone]  Fax: [phone]  Primary Care Physician:  Lorelee Market, MD   Pre-Procedure History & Physical: HPI:  Tammy Cannon is a 79 y.o. female is here for an colonoscopy.   Past Medical History:  Diagnosis Date  . Breast cancer [facility]) 1989   Right mastectomy /pos node [name] /Dr Cerame  . Cardiomegaly   . Complication of anesthesia    with colonoscopy  . Coronary atherosclerosis of native coronary artery   . COVID-19   . DCIS (ductal carcinoma in situ) 08/19/2016   left breast  . Diabetes mellitus without complication ([place])   . GERD (gastroesophageal reflux disease)   . History of kidney stones   . Hypothyroidism   . Other and unspecified hyperlipidemia   . Other chronic pulmonary heart diseases   . Panic attacks   . PONV (postoperative nausea and vomiting)   . Unspecified essential hypertension     Past Surgical History:  Procedure Laterality Date  . ABDOMINAL HYSTERECTOMY    . BREAST BIOPSY Left 08/19/2016   DUCTAL CARCINOMA IN SITU (DCIS) INVOLVING A SCLEROSING LESION  . CHOLECYSTECTOMY    . lumpectomy (otheR)    . MASTECTOMY Right   . SENTINEL NODE BIOPSY Left 09/13/2016   Procedure: SENTINEL NODE BIOPSY;  Surgeon: [name], MD;  Location: [facility] ORS;  Service: General;  Laterality: Left;  . SIMPLE MASTECTOMY WITH AXILLARY SENTINEL NODE BIOPSY Left 09/13/2016   Procedure: SIMPLE MASTECTOMY;  Surgeon: [name], MD;  Location: [facility] ORS;  Service: General;  Laterality: Left;    Prior to Admission medications   Medication Sig Start Date End Date Taking? Authorizing Provider  cholecalciferol (VITAMIN D) 1000 units tablet Take 1,000 Units by mouth daily.   Yes [provider]  levothyroxine (SYNTHROID, LEVOTHROID) 88 MCG tablet Take 88 mcg by mouth daily before breakfast.    Yes [provider]  metFORMIN (GLUCOPHAGE) 1000 MG tablet Take 1,000 mg by mouth 2 (two) times daily with a meal.   Yes [provider]  pantoprazole (PROTONIX) 40 MG tablet Take 40 mg by mouth every morning.    Yes [provider]  pravastatin (PRAVACHOL) 40 MG tablet Take 40 mg by mouth every evening.    Yes [provider]  trandolapril (MAVIK) 2 MG tablet Take 2 mg by mouth every evening.    Yes [provider]  verapamil (VERELAN PM) 240 MG 24 hr capsule Take 240 mg by mouth at bedtime.    Yes [provider]  albuterol (VENTOLIN HFA) 108 (90 Base) MCG/ACT inhaler Inhale 1-2 puffs into the lungs every 6 (six) hours as needed for wheezing or shortness of breath. Patient not taking: Reported on 02/04/2020 05/09/19   Alveria Apley, PA-C  doxycycline (VIBRA-TABS) 100 MG tablet Take 1 tablet (100 mg total) by mouth 2 (two) times daily. Patient not taking: Reported on 04/03/2020 09/12/17   Hyatt, Max T, DPM  Iron-Vitamin C (IRON 100/C PO) Take by mouth.    [provider]  neomycin-polymyxin-hydrocortisone (CORTISPORIN) otic solution Apply one to two drops to toe after soaking twice daily. Patient not taking: Reported on 02/04/2020 09/27/16   Hyatt, Max T, DPM  polyethylene glycol (MIRALAX) 17 g packet Take 17 g by mouth 2 (two) times daily. Take twice daily until going regularly 07/14/19   Cuthriell, Charline Bills, PA-C  Sodium Sulfate-Mag Sulfate-KCl (SUTAB) [phone] MG TABS Start at 5 pm. Using the 8 oz cup provided in the kit drink 5 cups of water after completing the first bottle of 12 pills. Repeat again 5 hours to the time of procedure with the second bottle. NPO 4 hours prior to procedure. 02/04/20   Jonathon Bellows, MD  traZODone (DESYREL) 50 MG tablet Take 50 mg by mouth at bedtime.     [provider]    Allergies as of 02/04/2020  . (No Known Allergies)    Family History  Problem Relation Age of Onset  . Breast cancer Sister     Social  History   Socioeconomic History  . Marital status: Widowed    Spouse name: Not on file  . Number of children: Not on file  . Years of education: Not on file  . Highest education level: Not on file  Occupational History  . Not on file  Tobacco Use  . Smoking status: Never Smoker  . Smokeless tobacco: Never Used  Vaping Use  . Vaping Use: Never used  Substance and Sexual Activity  . Alcohol use: No  . Drug use: No  . Sexual activity: Not on file  Other Topics Concern  . Not on file  Social History Narrative   Full time. Does not regularly exercises       Social Determinants of Health   Financial Resource Strain:   . Difficulty of Paying Living Expenses: Not on file  Food Insecurity:   . Worried About Charity fundraiser in the Last Year: Not on file  . Ran Out of Food in the Last Year: Not on file  Transportation Needs:   . Lack of Transportation (Medical): Not on file  . Lack of Transportation (Non-Medical): Not on file  Physical Activity:   . Days of Exercise per Week: Not on file  . Minutes of Exercise per Session: Not on file  Stress:   . Feeling of Stress : Not on file  Social Connections:   . Frequency of Communication with Friends and Family: Not on file  . Frequency of Social Gatherings with Friends and Family: Not on file  . Attends Religious Services: Not on file  . Active Member of Clubs or Organizations: Not on file  . Attends Archivist Meetings: Not on file  . Marital Status: Not on file  Intimate Partner Violence:   . Fear of Current or Ex-Partner: Not on file  . Emotionally Abused: Not on file  . Physically Abused: Not on file  . Sexually Abused: Not on file    Review of Systems: See HPI, otherwise negative ROS  Physical Exam: BP (!) 181/83   Pulse 70   Temp (!) 96.9 F (36.1 C) (Temporal)   Resp 17   Ht '5\' 3"'  (1.6 m)   Wt 59 kg   SpO2 99%   BMI 23.03 kg/m  General:   Alert,  pleasant and cooperative in NAD Head:   Normocephalic and atraumatic. Neck:  Supple; no masses or thyromegaly. Lungs:  Clear throughout to auscultation, normal respiratory effort.    Heart:  +S1, +S2, Regular rate and rhythm, No edema. Abdomen:  Soft, nontender and nondistended. Normal bowel sounds, without guarding, and without rebound.   Neurologic:  Alert and  oriented x4;  grossly normal neurologically.  Impression/Plan: Tammy Cannon is here for an colonoscopy to be performed for Screening colonoscopy average risk   Risks, benefits, limitations, and alternatives regarding  colonoscopy have been reviewed with the patient.  Questions have been answered.  All parties agreeable.   Jonathon Bellows, MD  04/03/2020, 9:42 AM

## 2020-04-03 NOTE — Transfer of Care (Signed)
Immediate Anesthesia Transfer of Care Note  Patient: Tammy Cannon  Procedure(s) Performed: COLONOSCOPY WITH PROPOFOL (N/A )  Patient Location: PACU  Anesthesia Type:General  Level of Consciousness: drowsy  Airway & Oxygen Therapy: Patient Spontanous Breathing  Post-op Assessment: Report given to RN and Post -op Vital signs reviewed and stable  Post vital signs: Reviewed and stable  Last Vitals:  Vitals Value Taken Time  BP 143/63 04/03/20 1017  Temp    Pulse 64 04/03/20 1017  Resp 12 04/03/20 1017  SpO2 97 % 04/03/20 1017  Vitals shown include unvalidated device data.  Last Pain:  Vitals:   04/03/20 0907  TempSrc: Temporal  PainSc: 0-No pain         Complications: No complications documented.

## 2020-04-03 NOTE — Op Note (Signed)
[facility] Gastroenterology Patient Name: Tammy Cannon Procedure Date: 04/03/2020 9:37 AM MRN: [id number] Account #: [id number] Date of Birth: Sep 16, 1940 Admit Type: Outpatient Age: 79 Room: [facility] ENDO ROOM 3 Gender: Female Note Status: Finalized Procedure:             Colonoscopy Indications:           Screening for colorectal malignant neoplasm Providers:             Jonathon Bellows MD, MD Referring MD:          Meindert A. Brunetta Genera, MD (Referring MD) Medicines:             Monitored Anesthesia Care Complications:         No immediate complications. Procedure:             Pre-Anesthesia Assessment:                        - Prior to the procedure, a History and Physical was                         performed, and patient medications, allergies and                         sensitivities were reviewed. The patient's tolerance                         of previous anesthesia was reviewed.                        - The risks and benefits of the procedure and the                         sedation options and risks were discussed with the                         patient. All questions were answered and informed                         consent was obtained.                        - ASA Grade Assessment: II - A patient with mild                         systemic disease.                        After obtaining informed consent, the colonoscope was                         passed under direct vision. Throughout the procedure,                         the patient's blood pressure, pulse, and oxygen                         saturations were monitored continuously. The                         Colonoscope was introduced through the anus  and                         advanced to the the cecum, identified by the                         appendiceal orifice. The colonoscopy was performed                         with ease. The patient tolerated the procedure well.                         The  quality of the bowel preparation was excellent. Findings:      The perianal and digital rectal examinations were normal.      A 5 mm polyp was found in the proximal ascending colon. The polyp was       sessile. The polyp was removed with a cold snare. Resection and       retrieval were complete. To prevent bleeding after the polypectomy, one       hemostatic clip was successfully placed. There was no bleeding at the       end of the procedure.      A 20 mm polyp was found in the mid ascending colon. The polyp was       granular lateral spreading. Area was unsuccessfully injected with 10 mL       [place] for a lift polypectomy. Central area appeared depressed Biopsies       were taken with a cold forceps for histology. Area was tattooed with an       injection of 5 mL of Spot (carbon black). Tatto placed proximally and       distally to the lesion      The exam was otherwise without abnormality on direct and retroflexion       views. Impression:            - One 5 mm polyp in the proximal ascending colon,                         removed with a cold snare. Resected and retrieved.                         Clip was placed.                        - One 20 mm polyp in the mid ascending colon.                         Treatment not successful. Biopsied. Tattooed.                        - The examination was otherwise normal on direct and                         retroflexion views. Recommendation:        - Discharge patient to home (with escort).                        - Resume previous diet.                        -  Continue present medications.                        - Await pathology results.                        - Return to my office in 2 weeks. Procedure Code(s):     --- Professional ---                        [phone], Colonoscopy, flexible; with removal of                         tumor(s), polyp(s), or other lesion(s) by snare                         technique                         [id number], Colonoscopy, flexible; with directed submucosal                         injection(s), any substance                        38333, 59, Colonoscopy, flexible; with biopsy, single                         or multiple Diagnosis Code(s):     --- Professional ---                        Z12.11, Encounter for screening for malignant neoplasm                         of colon                        K63.5, Polyp of colon CPT copyright 2019 American Medical Association. All rights reserved. The codes documented in this report are preliminary and upon coder review may  be revised to meet current compliance requirements. Jonathon Bellows, MD Jonathon Bellows MD, MD 04/03/2020 10:17:12 AM This report has been signed electronically. Number of Addenda: 0 Note Initiated On: 04/03/2020 9:37 AM Scope Withdrawal Time: 0 hours 24 minutes 11 seconds  Total Procedure Duration: 0 hours 29 minutes 6 seconds       [facility]

## 2020-04-04 ENCOUNTER — Encounter: Payer: Self-pay | Admitting: Gastroenterology

## 2020-04-04 ENCOUNTER — Other Ambulatory Visit: Payer: Self-pay | Admitting: Gastroenterology

## 2020-04-04 LAB — SURGICAL PATHOLOGY

## 2020-04-04 NOTE — Anesthesia Postprocedure Evaluation (Signed)
Anesthesia Post Note  Patient: Tammy Cannon  Procedure(s) Performed: COLONOSCOPY WITH PROPOFOL (N/A )  Patient location during evaluation: PACU Anesthesia Type: General Level of consciousness: awake and alert Pain management: pain level controlled Vital Signs Assessment: post-procedure vital signs reviewed and stable Respiratory status: spontaneous breathing, nonlabored ventilation and respiratory function stable Cardiovascular status: blood pressure returned to baseline and stable Postop Assessment: no apparent nausea or vomiting Anesthetic complications: no   No complications documented.   Last Vitals:  Vitals:   04/03/20 1027 04/03/20 1047  BP: (!) 154/69 (!) 182/80  Pulse:    Resp:    Temp:    SpO2:      Last Pain:  Vitals:   04/04/20 0732  TempSrc:   PainSc: 0-No pain                 Brett Canales Anshi Jalloh

## 2020-04-07 ENCOUNTER — Encounter: Payer: Self-pay | Admitting: Oncology

## 2020-04-07 ENCOUNTER — Inpatient Hospital Stay: Payer: PRIVATE HEALTH INSURANCE | Attending: Oncology | Admitting: Oncology

## 2020-04-07 ENCOUNTER — Inpatient Hospital Stay: Payer: PRIVATE HEALTH INSURANCE

## 2020-04-07 ENCOUNTER — Other Ambulatory Visit: Payer: Self-pay

## 2020-04-07 ENCOUNTER — Ambulatory Visit (INDEPENDENT_AMBULATORY_CARE_PROVIDER_SITE_OTHER): Payer: Medicare Other | Admitting: Gastroenterology

## 2020-04-07 VITALS — BP 179/68 | HR 53 | Temp 96.9°F | Resp 18 | Ht 64.0 in | Wt 132.0 lb

## 2020-04-07 VITALS — Ht 64.0 in | Wt 132.0 lb

## 2020-04-07 DIAGNOSIS — Z9013 Acquired absence of bilateral breasts and nipples: Secondary | ICD-10-CM | POA: Insufficient documentation

## 2020-04-07 DIAGNOSIS — C189 Malignant neoplasm of colon, unspecified: Secondary | ICD-10-CM

## 2020-04-07 DIAGNOSIS — Z79899 Other long term (current) drug therapy: Secondary | ICD-10-CM | POA: Diagnosis not present

## 2020-04-07 DIAGNOSIS — E119 Type 2 diabetes mellitus without complications: Secondary | ICD-10-CM | POA: Diagnosis not present

## 2020-04-07 DIAGNOSIS — Z7984 Long term (current) use of oral hypoglycemic drugs: Secondary | ICD-10-CM | POA: Diagnosis not present

## 2020-04-07 DIAGNOSIS — C182 Malignant neoplasm of ascending colon: Secondary | ICD-10-CM

## 2020-04-07 DIAGNOSIS — Z9049 Acquired absence of other specified parts of digestive tract: Secondary | ICD-10-CM | POA: Insufficient documentation

## 2020-04-07 DIAGNOSIS — E039 Hypothyroidism, unspecified: Secondary | ICD-10-CM | POA: Insufficient documentation

## 2020-04-07 DIAGNOSIS — Z853 Personal history of malignant neoplasm of breast: Secondary | ICD-10-CM | POA: Diagnosis not present

## 2020-04-07 DIAGNOSIS — I2510 Atherosclerotic heart disease of native coronary artery without angina pectoris: Secondary | ICD-10-CM | POA: Insufficient documentation

## 2020-04-07 DIAGNOSIS — I272 Pulmonary hypertension, unspecified: Secondary | ICD-10-CM | POA: Diagnosis not present

## 2020-04-07 LAB — COMPREHENSIVE METABOLIC PANEL
ALT: 16 U/L (ref 0–44)
AST: 19 U/L (ref 15–41)
Albumin: 4.2 g/dL (ref 3.5–5.0)
Alkaline Phosphatase: 64 U/L (ref 38–126)
Anion gap: 11 (ref 5–15)
BUN: 18 mg/dL (ref 8–23)
CO2: 25 mmol/L (ref 22–32)
Calcium: 9.1 mg/dL (ref 8.9–10.3)
Chloride: 104 mmol/L (ref 98–111)
Creatinine, Ser: 0.96 mg/dL (ref 0.44–1.00)
GFR, Estimated: >60 mL/min (ref >60)
Glucose, Bld: 105 mg/dL — ABNORMAL HIGH (ref 70–99)
Potassium: 4.2 mmol/L (ref 3.5–5.1)
Sodium: 140 mmol/L (ref 135–145)
Total Bilirubin: 0.6 mg/dL (ref 0.3–1.2)
Total Protein: 7.5 g/dL (ref 6.5–8.1)

## 2020-04-07 LAB — CBC WITH DIFFERENTIAL/PLATELET
Abs Immature Granulocytes: 0.01 10*3/uL (ref 0.00–0.07)
Basophils Absolute: 0.1 10*3/uL (ref 0.0–0.1)
Basophils Relative: 1 %
Eosinophils Absolute: 0.1 10*3/uL (ref 0.0–0.5)
Eosinophils Relative: 2 %
HCT: 37.3 % (ref 36.0–46.0)
Hemoglobin: 11.9 g/dL — ABNORMAL LOW (ref 12.0–15.0)
Immature Granulocytes: 0 %
Lymphocytes Relative: 39 %
Lymphs Abs: 2.4 10*3/uL (ref 0.7–4.0)
MCH: 27.5 pg (ref 26.0–34.0)
MCHC: 31.9 g/dL (ref 30.0–36.0)
MCV: 86.3 fL (ref 80.0–100.0)
Monocytes Absolute: 0.4 10*3/uL (ref 0.1–1.0)
Monocytes Relative: 7 %
Neutro Abs: 3.1 10*3/uL (ref 1.7–7.7)
Neutrophils Relative %: 51 %
Platelets: 219 10*3/uL (ref 150–400)
RBC: 4.32 MIL/uL (ref 3.87–5.11)
RDW: 14.2 % (ref 11.5–15.5)
WBC: 6.2 10*3/uL (ref 4.0–10.5)
nRBC: 0 % (ref 0.0–0.2)

## 2020-04-07 LAB — FERRITIN: Ferritin: 8 ng/mL — ABNORMAL LOW (ref 11–307)

## 2020-04-07 LAB — IRON AND TIBC
Iron: 35 ug/dL (ref 28–170)
Saturation Ratios: 7 % — ABNORMAL LOW (ref 10.4–31.8)
TIBC: 493 ug/dL — ABNORMAL HIGH (ref 250–450)
UIBC: 458 ug/dL

## 2020-04-07 NOTE — Progress Notes (Signed)
Jonathon Bellows MD, MRCP(U.K) [address]  [place]  [place],  [address]  Main: [phone]  Fax: [phone]   Gastroenterology Consultation  Referring Provider:     Lorelee Market, MD Primary Care Physician:  Lorelee Market, MD Primary Gastroenterologist:  Dr. Jonathon Bellows  Reason for Consultation:     Discuss results of recent colonoscopy        HPI:   Tammy Cannon is a 79 y.o. y/o female referred for consultation & management  by Dr. Lorelee Market, MD.     She underwent a screening colonoscopy average risk on 04/03/2020. I resected a 5 mm polyp in the proximal ascending colon which was sessile. I also noted a flat depressed lesion at least 20 mm in size if not larger in the mid ascending colon. I commenced preparation to resect the lesion but after submucosal injection did not get adequate left which made it suspicious for a malignant lesion. I tattooed the area and took biopsies from the depressed part of the lesion. The polyp that was resected in the ascending colon was a tubular adenoma and the biopsies taken from the malignant appearing lesion confirmed intramucosal adenocarcinoma. I have brought her here today to discuss the results.  Past Medical History:  Diagnosis Date  . Breast cancer [facility]) 1989   Right mastectomy /pos node [name] /Dr Cerame  . Cardiomegaly   . Complication of anesthesia    with colonoscopy  . Coronary atherosclerosis of native coronary artery   . COVID-19   . DCIS (ductal carcinoma in situ) 08/19/2016   left breast  . Diabetes mellitus without complication ([place])   . GERD (gastroesophageal reflux disease)   . History of kidney stones   . Hypothyroidism   . Other and unspecified hyperlipidemia   . Other chronic pulmonary heart diseases   . Panic attacks   . PONV (postoperative nausea and vomiting)   . Unspecified essential hypertension     Past Surgical History:  Procedure Laterality Date  . ABDOMINAL HYSTERECTOMY      . BREAST BIOPSY Left 08/19/2016   DUCTAL CARCINOMA IN SITU (DCIS) INVOLVING A SCLEROSING LESION  . CHOLECYSTECTOMY    . COLONOSCOPY WITH PROPOFOL N/A 04/03/2020   Procedure: COLONOSCOPY WITH PROPOFOL;  Surgeon: Jonathon Bellows, MD;  Location: [facility] ENDOSCOPY;  Service: Gastroenterology;  Laterality: N/A;  . lumpectomy (otheR)    . MASTECTOMY Right   . SENTINEL NODE BIOPSY Left 09/13/2016   Procedure: SENTINEL NODE BIOPSY;  Surgeon: [name], MD;  Location: [facility] ORS;  Service: General;  Laterality: Left;  . SIMPLE MASTECTOMY WITH AXILLARY SENTINEL NODE BIOPSY Left 09/13/2016   Procedure: SIMPLE MASTECTOMY;  Surgeon: [name], MD;  Location: [facility] ORS;  Service: General;  Laterality: Left;    Prior to Admission medications   Medication Sig Start Date End Date Taking? Authorizing Provider  albuterol (VENTOLIN HFA) 108 (90 Base) MCG/ACT inhaler Inhale 1-2 puffs into the lungs every 6 (six) hours as needed for wheezing or shortness of breath. Patient not taking: Reported on 02/04/2020 05/09/19   Madilyn Hook A, PA-C  cholecalciferol (VITAMIN D) 1000 units tablet Take 1,000 Units by mouth daily.    [provider]  doxycycline (VIBRA-TABS) 100 MG tablet Take 1 tablet (100 mg total) by mouth 2 (two) times daily. Patient not taking: Reported on 04/03/2020 09/12/17   Hyatt, Max T, DPM  Iron-Vitamin C (IRON 100/C PO) Take by mouth.    [provider]  levothyroxine (SYNTHROID,  LEVOTHROID) 88 MCG tablet Take 88 mcg by mouth daily before breakfast.     [provider]  metFORMIN (GLUCOPHAGE) 1000 MG tablet Take 1,000 mg by mouth 2 (two) times daily with a meal.    [provider]  neomycin-polymyxin-hydrocortisone (CORTISPORIN) otic solution Apply one to two drops to toe after soaking twice daily. Patient not taking: Reported on 02/04/2020 09/27/16   Hyatt, Max T, DPM  pantoprazole (PROTONIX) 40 MG tablet Take 40 mg by mouth every morning.     [provider]  polyethylene glycol (MIRALAX) 17 g packet Take 17 g by mouth 2 (two) times daily. Take twice daily until going regularly 07/14/19   Cuthriell, Charline Bills, PA-C  pravastatin (PRAVACHOL) 40 MG tablet Take 40 mg by mouth every evening.     [provider]  Sodium Sulfate-Mag Sulfate-KCl (SUTAB) [phone] MG TABS Start at 5 pm. Using the 8 oz cup provided in the kit drink 5 cups of water after completing the first bottle of 12 pills. Repeat again 5 hours to the time of procedure with the second bottle. NPO 4 hours prior to procedure. 02/04/20   Jonathon Bellows, MD  trandolapril (MAVIK) 2 MG tablet Take 2 mg by mouth every evening.     [provider]  traZODone (DESYREL) 50 MG tablet Take 50 mg by mouth at bedtime.     [provider]  verapamil (VERELAN PM) 240 MG 24 hr capsule Take 240 mg by mouth at bedtime.     [provider]    Family History  Problem Relation Age of Onset  . Breast cancer Sister      Social History   Tobacco Use  . Smoking status: Never Smoker  . Smokeless tobacco: Never Used  Vaping Use  . Vaping Use: Never used  Substance Use Topics  . Alcohol use: No  . Drug use: No    Allergies as of 04/07/2020  . (No Known Allergies)    Review of Systems:    All systems reviewed and negative except where noted in HPI.   Physical Exam:  Ht _0  (1.626 m)   Wt 132 lb (59.9 kg)   BMI 22.66 kg/m  No LMP recorded. Patient has had a hysterectomy. Psych:  Alert and cooperative. Normal mood and affect. General:   Alert,  Well-developed, well-nourished, pleasant and cooperative in NAD Head:  Normocephalic and atraumatic. Eyes:  Sclera clear, no icterus.   Conjunctiva pink. Neurologic:  Alert and oriented x3;  grossly normal neurologically. Psych:  Alert and cooperative. Normal mood and affect.  Imaging Studies: No results found.  Assessment and Plan:   Tammy Cannon is a 79 y.o. y/o female is here today to see me to  discuss the results of her recent colonoscopy. There was a large polyp in the ascending colon which could not be resected and was suspicious for malignancy. The biopsy has confirmed intramucosal adenocarcinoma. The area has been tattooed.  Plan 1. Refer to oncology Dr. Janese Banks who I have discussed with and is going to see the patient today, patient prefers today as she is accompanied by her daughter, to obtain further tests including staging scans and depending on the scans may require surgical referral .   2. If she has surgery will need a repeat colonoscopy in about 6 months  Follow up as needed  Dr Jonathon Bellows MD,MRCP(U.K)

## 2020-04-08 ENCOUNTER — Encounter: Payer: Self-pay | Admitting: Oncology

## 2020-04-08 LAB — CEA: CEA: 1.9 ng/mL (ref 0.0–4.7)

## 2020-04-08 NOTE — Progress Notes (Signed)
Hematology/Oncology Consult note [facility] Telephone:([phone] [phone]  Patient Care Team: Lorelee Market, MD as PCP - General (Family Medicine) Geannie Risen, MD as Referring Physician (Obstetrics and Gynecology) Bary Castilla Forest Gleason, MD (General Surgery)   Name of the patient: Tammy Cannon  [id number]  11/01/1940    Reason for referral-new diagnosis of colon cancer   Referring [place]  Date of visit: 04/08/20   History of presenting illness- Patient is a 79 year old female who recently underwent screening colonoscopy with Dr. Vicente Males on 04/03/2020 which showed a 2 cm polyp in the mid ascending colon which was biopsied.  Biopsy showed at least intramucosal invasive adenocarcinoma.  She has been referred for the same.  She is doing well for her age and lives alone and is independent of her ADLs and IADLs.  She is still working.  She does report intermittent rectal bleeding from her hemorrhoids.  Appetite and weight have remained stable.  She has had prior history of breast cancer once in 1989 for which she underwent surgery radiation and adjuvant chemotherapy and the second left breast cancer was in 2010 and she has now undergone a bilateral mastectomy  ECOG PS- 1  Pain scale- 0   Review of systems- Review of Systems  Constitutional: Negative for chills, fever, malaise/fatigue and weight loss.  HENT: Negative for congestion, ear discharge and nosebleeds.   Eyes: Negative for blurred vision.  Respiratory: Negative for cough, hemoptysis, sputum production, shortness of breath and wheezing.   Cardiovascular: Negative for chest pain, palpitations, orthopnea and claudication.  Gastrointestinal: Negative for abdominal pain, blood in stool, constipation, diarrhea, heartburn, melena, nausea and vomiting.  Genitourinary: Negative for dysuria, flank pain, frequency, hematuria and urgency.  Musculoskeletal: Negative for back pain,  joint pain and myalgias.  Skin: Negative for rash.  Neurological: Negative for dizziness, tingling, focal weakness, seizures, weakness and headaches.  Endo/Heme/Allergies: Does not bruise/bleed easily.  Psychiatric/Behavioral: Negative for depression and suicidal ideas. The patient does not have insomnia.     No Known Allergies  Patient Active Problem List   Diagnosis Date Noted  . Chronic venous insufficiency 08/25/2017  . Ductal carcinoma in situ (DCIS) of left breast 08/26/2016  . Radial scar of breast 08/26/2016  . HYPERLIPIDEMIA-MIXED 03/05/2009  . HYPERTENSION, UNSPECIFIED 03/05/2009  . CAD, NATIVE VESSEL 03/05/2009  . PULMONARY HYPERTENSION 03/05/2009  . VENTRICULAR HYPERTROPHY, LEFT 03/05/2009     Past Medical History:  Diagnosis Date  . Breast cancer [facility]) 1989   Right mastectomy /pos node [name] /Dr Cerame  . Cardiomegaly   . Complication of anesthesia    with colonoscopy  . Coronary atherosclerosis of native coronary artery   . COVID-19   . DCIS (ductal carcinoma in situ) 08/19/2016   left breast  . Diabetes mellitus without complication ([place])   . GERD (gastroesophageal reflux disease)   . History of kidney stones   . Hypothyroidism   . Other and unspecified hyperlipidemia   . Other chronic pulmonary heart diseases   . Panic attacks   . PONV (postoperative nausea and vomiting)   . Unspecified essential hypertension      Past Surgical History:  Procedure Laterality Date  . ABDOMINAL HYSTERECTOMY    . BREAST BIOPSY Left 08/19/2016   DUCTAL CARCINOMA IN SITU (DCIS) INVOLVING A SCLEROSING LESION  . CHOLECYSTECTOMY    . COLONOSCOPY WITH PROPOFOL N/A 04/03/2020   Procedure: COLONOSCOPY WITH PROPOFOL;  Surgeon: Jonathon Bellows, MD;  Location: [facility] ENDOSCOPY;  Service: Gastroenterology;  Laterality: N/A;  . lumpectomy (otheR)    . MASTECTOMY Right   . SENTINEL NODE BIOPSY Left 09/13/2016   Procedure: SENTINEL NODE BIOPSY;  Surgeon: [name], MD;   Location: [facility] ORS;  Service: General;  Laterality: Left;  . SIMPLE MASTECTOMY WITH AXILLARY SENTINEL NODE BIOPSY Left 09/13/2016   Procedure: SIMPLE MASTECTOMY;  Surgeon: [name], MD;  Location: [facility] ORS;  Service: General;  Laterality: Left;    Social History   Socioeconomic History  . Marital status: Widowed    Spouse name: Not on file  . Number of children: Not on file  . Years of education: Not on file  . Highest education level: Not on file  Occupational History  . Not on file  Tobacco Use  . Smoking status: Never Smoker  . Smokeless tobacco: Never Used  Vaping Use  . Vaping Use: Never used  Substance and Sexual Activity  . Alcohol use: No  . Drug use: No  . Sexual activity: Not on file  Other Topics Concern  . Not on file  Social History Narrative   Full time. Does not regularly exercises       Social Determinants of Health   Financial Resource Strain:   . Difficulty of Paying Living Expenses: Not on file  Food Insecurity:   . Worried About Charity fundraiser in the Last Year: Not on file  . Ran Out of Food in the Last Year: Not on file  Transportation Needs:   . Lack of Transportation (Medical): Not on file  . Lack of Transportation (Non-Medical): Not on file  Physical Activity:   . Days of Exercise per Week: Not on file  . Minutes of Exercise per Session: Not on file  Stress:   . Feeling of Stress : Not on file  Social Connections:   . Frequency of Communication with Friends and Family: Not on file  . Frequency of Social Gatherings with Friends and Family: Not on file  . Attends Religious Services: Not on file  . Active Member of Clubs or Organizations: Not on file  . Attends Archivist Meetings: Not on file  . Marital Status: Not on file  Intimate Partner Violence:   . Fear of Current or Ex-Partner: Not on file  . Emotionally Abused: Not on file  . Physically Abused: Not on file  . Sexually Abused: Not on file     Family History   Problem Relation Age of Onset  . Breast cancer Sister      Current Outpatient Medications:  .  levothyroxine (SYNTHROID, LEVOTHROID) 88 MCG tablet, Take 88 mcg by mouth daily before breakfast. , Disp: , Rfl:  .  metFORMIN (GLUCOPHAGE) 1000 MG tablet, Take 1,000 mg by mouth 2 (two) times daily with a meal., Disp: , Rfl:  .  pantoprazole (PROTONIX) 40 MG tablet, Take 40 mg by mouth every morning. , Disp: , Rfl:  .  pravastatin (PRAVACHOL) 40 MG tablet, Take 40 mg by mouth every evening. , Disp: , Rfl:  .  trandolapril (MAVIK) 2 MG tablet, Take 2 mg by mouth every evening. , Disp: , Rfl:  .  traZODone (DESYREL) 50 MG tablet, Take 50 mg by mouth at bedtime. , Disp: , Rfl:  .  verapamil (VERELAN PM) 240 MG 24 hr capsule, Take 240 mg by mouth at bedtime. , Disp: , Rfl:  .  albuterol (VENTOLIN HFA) 108 (90 Base) MCG/ACT inhaler, Inhale 1-2 puffs into the lungs every 6 (  six) hours as needed for wheezing or shortness of breath. (Patient not taking: Reported on 02/04/2020), Disp: 6.7 g, Rfl: 0 .  cholecalciferol (VITAMIN D) 1000 units tablet, Take 1,000 Units by mouth daily. (Patient not taking: Reported on 04/07/2020), Disp: , Rfl:  .  doxycycline (VIBRA-TABS) 100 MG tablet, Take 1 tablet (100 mg total) by mouth 2 (two) times daily. (Patient not taking: Reported on 04/03/2020), Disp: 20 tablet, Rfl: 0 .  Iron-Vitamin C (IRON 100/C PO), Take by mouth. (Patient not taking: Reported on 04/07/2020), Disp: , Rfl:  .  neomycin-polymyxin-hydrocortisone (CORTISPORIN) otic solution, Apply one to two drops to toe after soaking twice daily. (Patient not taking: Reported on 02/04/2020), Disp: 10 mL, Rfl: 0 .  polyethylene glycol (MIRALAX) 17 g packet, Take 17 g by mouth 2 (two) times daily. Take twice daily until going regularly (Patient not taking: Reported on 04/07/2020), Disp: 30 each, Rfl: 1 .  Sodium Sulfate-Mag Sulfate-KCl (SUTAB) [phone] MG TABS, Start at 5 pm. Using the 8 oz cup provided in the kit drink  5 cups of water after completing the first bottle of 12 pills. Repeat again 5 hours to the time of procedure with the second bottle. NPO 4 hours prior to procedure. (Patient not taking: Reported on 04/07/2020), Disp: 24 tablet, Rfl: 0   Physical exam:  Vitals:   04/07/20 1433  BP: (!) 179/68  Pulse: (!) 53  Resp: 18  Temp: (!) 96.9 F (36.1 C)  TempSrc: Tympanic  SpO2: 99%  Weight: 132 lb (59.9 kg)  Height: '5\' 4"'  (1.626 m)   Physical Exam Constitutional:      General: She is not in acute distress. Cardiovascular:     Rate and Rhythm: Normal rate and regular rhythm.     Heart sounds: Normal heart sounds.  Pulmonary:     Effort: Pulmonary effort is normal.     Breath sounds: Normal breath sounds.  Abdominal:     General: Bowel sounds are normal.     Palpations: Abdomen is soft.  Skin:    General: Skin is warm and dry.  Neurological:     Mental Status: She is alert and oriented to person, place, and time.        CMP Latest Ref Rng & Units 04/07/2020  Glucose 70 - 99 mg/dL 105(H)  BUN 8 - 23 mg/dL 18  Creatinine 0.44 - 1.00 mg/dL 0.96  Sodium 135 - 145 mmol/L 140  Potassium 3.5 - 5.1 mmol/L 4.2  Chloride 98 - 111 mmol/L 104  CO2 22 - 32 mmol/L 25  Calcium 8.9 - 10.3 mg/dL 9.1  Total Protein 6.5 - 8.1 g/dL 7.5  Total Bilirubin 0.3 - 1.2 mg/dL 0.6  Alkaline Phos 38 - 126 U/L 64  AST 15 - 41 U/L 19  ALT 0 - 44 U/L 16   CBC Latest Ref Rng & Units 04/07/2020  WBC 4.0 - 10.5 K/uL 6.2  Hemoglobin 12.0 - 15.0 g/dL 11.9(L)  Hematocrit 36 - 46 % 37.3  Platelets 150 - 400 K/uL 219     Assessment and plan- Patient is a 79 y.o. female with new diagnosis of colon cancer  Patient found to have at least intramucosal invasive adenocarcinoma based on an ascending colon polyp that was biopsied.  It is unclear as to how deep this adenocarcinoma is as the polyp was not completely resected.  My plan is to proceed with a CT chest abdomen pelvis with contrast to complete her staging  work-up.  Obtain a  CBC with differential CMP CEA ferritin and iron studies today.  Video visit after CT scans are back.  If she has no evidence of distant metastatic disease I will refer her to surgery for definitive resection of this cancerous polyp.  Discussed with the patient that colon cancer has 4 stages.  Stage I and most of stage II colon cancer is did not require adjuvant chemotherapy.  Adjuvant chemotherapy is typically offered for stage III colon cancer and chemotherapy is palliative for stage IV.Patient had a renal stone study in February 2021 which did not show any evidence of distant disease back then.  Patient was accompanied by her daughter today and they both comprehend my plan well   Thank you for this kind referral and the opportunity to participate in the care of this patient   Visit Diagnosis 1. Malignant neoplasm of colon, unspecified part of colon ([place])     Dr. Randa Evens, MD, MPH [facility] [phone] 04/08/2020 9:35 AM

## 2020-04-16 ENCOUNTER — Ambulatory Visit
Admission: RE | Admit: 2020-04-16 | Discharge: 2020-04-16 | Disposition: A | Discharge status: Home / Self Care | Payer: PRIVATE HEALTH INSURANCE | Source: Ambulatory Visit | Attending: Oncology | Admitting: Oncology

## 2020-04-16 ENCOUNTER — Inpatient Hospital Stay: Payer: No Typology Code available for payment source

## 2020-04-16 ENCOUNTER — Other Ambulatory Visit: Payer: Self-pay

## 2020-04-16 DIAGNOSIS — Z86010 Personal history of colonic polyps: Secondary | ICD-10-CM | POA: Diagnosis not present

## 2020-04-16 DIAGNOSIS — C189 Malignant neoplasm of colon, unspecified: Secondary | ICD-10-CM | POA: Diagnosis not present

## 2020-04-16 DIAGNOSIS — S22080A Wedge compression fracture of T11-T12 vertebra, initial encounter for closed fracture: Secondary | ICD-10-CM | POA: Diagnosis not present

## 2020-04-16 DIAGNOSIS — K449 Diaphragmatic hernia without obstruction or gangrene: Secondary | ICD-10-CM | POA: Diagnosis not present

## 2020-04-16 DIAGNOSIS — Z853 Personal history of malignant neoplasm of breast: Secondary | ICD-10-CM | POA: Diagnosis not present

## 2020-04-16 DIAGNOSIS — N200 Calculus of kidney: Secondary | ICD-10-CM | POA: Diagnosis not present

## 2020-04-16 MED ORDER — IOHEXOL 300 MG/ML  SOLN
100.0000 mL | Freq: Once | INTRAMUSCULAR | Status: AC | PRN
  Administered 2020-04-16: 75 mL via INTRAVENOUS

## 2020-04-17 ENCOUNTER — Other Ambulatory Visit: Payer: PRIVATE HEALTH INSURANCE

## 2020-04-18 ENCOUNTER — Telehealth: Payer: Self-pay | Admitting: *Deleted

## 2020-04-18 DIAGNOSIS — C189 Malignant neoplasm of colon, unspecified: Secondary | ICD-10-CM

## 2020-04-18 NOTE — Telephone Encounter (Signed)
Called to speak to pt if she wanted to come in sooner since dr Janese Banks got the results. She said that she and her daughter had already asked off of work for Thursday of next week and both of them have to work on Monday. I asked her if she wanted to do video visit and she would talk to her daughter. Vicky called back and said that they can do video visit at 4:30 and it will be on vicky's phone. I have changed the appt. I also asked who the pt would want to do the surgery and they want dr. Bary Castilla. Will make ref. For him.

## 2020-04-18 NOTE — Progress Notes (Signed)
Tumor Board Documentation  PRINCETTA UPLINGER was presented by Dr Janese Banks at our Tumor Board on 04/17/2020, which included representatives from medical oncology, radiation oncology, internal medicine, navigation, pathology, radiology, surgical, pharmacy, genetics, research, palliative care, pulmonology.  Ruthanne currently presents as a new patient, for [place], for new positive pathology with history of the following treatments: surgical intervention(s).  Additionally, we reviewed previous medical and familial history, history of present illness, and recent lab results along with all available histopathologic and imaging studies. The tumor board considered available treatment options and made the following recommendations: Surgery    The following procedures/referrals were also placed: No orders of the defined types were placed in this encounter.   Clinical Trial Status: not discussed   Staging used: To be determined  AJCC Staging:       Group: Adenocarcinoma of Colon   National site-specific guidelines   were discussed with respect to the case.  Tumor board is a meeting of clinicians from various specialty areas who evaluate and discuss patients for whom a multidisciplinary approach is being considered. Final determinations in the plan of care are those of the provider(s). The responsibility for follow up of recommendations given during tumor board is that of the provider.   Today's extended care, comprehensive team conference, [place] was not present for the discussion and was not examined.   Multidisciplinary Tumor Board is a multidisciplinary case peer review process.  Decisions discussed in the Multidisciplinary Tumor Board reflect the opinions of the specialists present at the conference without having examined the patient.  Ultimately, treatment and diagnostic decisions rest with the primary provider(s) and the patient.

## 2020-04-21 ENCOUNTER — Inpatient Hospital Stay: Payer: Medicare Other | Attending: Oncology | Admitting: Oncology

## 2020-04-21 DIAGNOSIS — C182 Malignant neoplasm of ascending colon: Secondary | ICD-10-CM

## 2020-04-22 DIAGNOSIS — C182 Malignant neoplasm of ascending colon: Secondary | ICD-10-CM | POA: Diagnosis not present

## 2020-04-24 ENCOUNTER — Ambulatory Visit: Payer: PRIVATE HEALTH INSURANCE | Admitting: Oncology

## 2020-04-24 ENCOUNTER — Telehealth: Payer: Self-pay | Admitting: Oncology

## 2020-04-24 NOTE — Progress Notes (Signed)
I connected with Tammy Cannon on 04/24/20 at  4:30 PM EST by video enabled telemedicine visit and verified that I am speaking with the correct person using two identifiers.   I discussed the limitations, risks, security and privacy concerns of performing an evaluation and management service by telemedicine and the availability of in-person appointments. I also discussed with the patient that there may be a patient responsible charge related to this service. The patient expressed understanding and agreed to proceed.  There were problems with video visit and was switched to telephone call due to unstable Internet connection  Other persons participating in the visit and their role in the encounter:  Patients daughter  Patient's location:  home Provider's location:  work  Risk analyst Complaint: Discuss CT scan results and further management  History of present illness: Patient is a 79 year old female who recently underwent screening colonoscopy with Dr. Vicente Males on 04/03/2020 which showed a 2 cm polyp in the mid ascending colon which was biopsied.  Biopsy showed at least intramucosal invasive adenocarcinoma.  She has been referred for the same.  She is doing well for her age and lives alone and is independent of her ADLs and IADLs.  She is still working.  She does report intermittent rectal bleeding from her hemorrhoids.  Appetite and weight have remained stable.  She has had prior history of breast cancer once in 1989 for which she underwent surgery radiation and adjuvant chemotherapy and the second left breast cancer was in 2010 and she has now undergone a bilateral mastectomy   Interval history no acute complaints since last visit.   Review of Systems  Constitutional: Negative for chills, fever, malaise/fatigue and weight loss.  HENT: Negative for congestion, ear discharge and nosebleeds.   Eyes: Negative for blurred vision.  Respiratory: Negative for cough, hemoptysis, sputum production, shortness of  breath and wheezing.   Cardiovascular: Negative for chest pain, palpitations, orthopnea and claudication.  Gastrointestinal: Negative for abdominal pain, blood in stool, constipation, diarrhea, heartburn, melena, nausea and vomiting.  Genitourinary: Negative for dysuria, flank pain, frequency, hematuria and urgency.  Musculoskeletal: Negative for back pain, joint pain and myalgias.  Skin: Negative for rash.  Neurological: Negative for dizziness, tingling, focal weakness, seizures, weakness and headaches.  Endo/Heme/Allergies: Does not bruise/bleed easily.  Psychiatric/Behavioral: Negative for depression and suicidal ideas. The patient does not have insomnia.     No Known Allergies  Past Medical History:  Diagnosis Date  . Breast cancer [facility]) 1989   Right mastectomy /pos node Tammy Cannon /Dr Cerame  . Cardiomegaly   . Complication of anesthesia    with colonoscopy  . Coronary atherosclerosis of native coronary artery   . COVID-19   . DCIS (ductal carcinoma in situ) 08/19/2016   left breast  . Diabetes mellitus without complication ([place])   . GERD (gastroesophageal reflux disease)   . History of kidney stones   . Hypothyroidism   . Other and unspecified hyperlipidemia   . Other chronic pulmonary heart diseases   . Panic attacks   . PONV (postoperative nausea and vomiting)   . Unspecified essential hypertension     Past Surgical History:  Procedure Laterality Date  . ABDOMINAL HYSTERECTOMY    . BREAST BIOPSY Left 08/19/2016   DUCTAL CARCINOMA IN SITU (DCIS) INVOLVING A SCLEROSING LESION  . CHOLECYSTECTOMY    . COLONOSCOPY WITH PROPOFOL N/A 04/03/2020   Procedure: COLONOSCOPY WITH PROPOFOL;  Surgeon: Jonathon Bellows, MD;  Location: [facility] ENDOSCOPY;  Service: Gastroenterology;  Laterality: N/A;  .  lumpectomy (otheR)    . MASTECTOMY Right   . SENTINEL NODE BIOPSY Left 09/13/2016   Procedure: SENTINEL NODE BIOPSY;  Surgeon: [name], MD;  Location: [facility] ORS;  Service: General;   Laterality: Left;  . SIMPLE MASTECTOMY WITH AXILLARY SENTINEL NODE BIOPSY Left 09/13/2016   Procedure: SIMPLE MASTECTOMY;  Surgeon: [name], MD;  Location: [facility] ORS;  Service: General;  Laterality: Left;    Social History   Socioeconomic History  . Marital status: Widowed    Spouse name: Not on file  . Number of children: Not on file  . Years of education: Not on file  . Highest education level: Not on file  Occupational History  . Not on file  Tobacco Use  . Smoking status: Never Smoker  . Smokeless tobacco: Never Used  Vaping Use  . Vaping Use: Never used  Substance and Sexual Activity  . Alcohol use: No  . Drug use: No  . Sexual activity: Not on file  Other Topics Concern  . Not on file  Social History Narrative   Full time. Does not regularly exercises       Social Determinants of Health   Financial Resource Strain: Not on file  Food Insecurity: Not on file  Transportation Needs: Not on file  Physical Activity: Not on file  Stress: Not on file  Social Connections: Not on file  Intimate Partner Violence: Not on file    Family History  Problem Relation Age of Onset  . Breast cancer Sister      Current Outpatient Medications:  .  acetaminophen (TYLENOL) 500 MG tablet, Take 500 mg by mouth every 6 (six) hours as needed for headache., Disp: , Rfl:  .  aspirin EC 81 MG tablet, Take 81 mg by mouth daily as needed (heart murmur). Swallow whole., Disp: , Rfl:  .  levothyroxine (SYNTHROID) 100 MCG tablet, Take 100 mcg by mouth daily before breakfast., Disp: , Rfl:  .  metFORMIN (GLUCOPHAGE) 1000 MG tablet, Take 1,000 mg by mouth at bedtime., Disp: , Rfl:  .  pantoprazole (PROTONIX) 40 MG tablet, Take 40 mg by mouth daily., Disp: , Rfl:  .  polyethylene glycol (MIRALAX) 17 g packet, Take 17 g by mouth 2 (two) times daily. Take twice daily until going regularly (Patient taking differently: Take 17 g by mouth daily as needed for moderate constipation.), Disp: 30  each, Rfl: 1 .  pravastatin (PRAVACHOL) 40 MG tablet, Take 40 mg by mouth at bedtime., Disp: , Rfl:  .  trandolapril (MAVIK) 4 MG tablet, Take 4 mg by mouth daily., Disp: , Rfl:  .  traZODone (DESYREL) 50 MG tablet, Take 50 mg by mouth at bedtime. , Disp: , Rfl:  .  verapamil (VERELAN PM) 240 MG 24 hr capsule, Take 240 mg by mouth at bedtime. , Disp: , Rfl:   CT CHEST ABDOMEN PELVIS W CONTRAST  Result Date: 04/16/2020 CLINICAL DATA:  Intramucosal adenocarcinoma in a polyp from the ascending colon. History of breast cancer. EXAM: CT CHEST, ABDOMEN, AND PELVIS WITH CONTRAST TECHNIQUE: Multidetector CT imaging of the chest, abdomen and pelvis was performed following the standard protocol during bolus administration of intravenous contrast. CONTRAST:  69mL OMNIPAQUE IOHEXOL 300 MG/ML  SOLN COMPARISON:  Noncontrast CT abdomen from 07/14/2019 FINDINGS: CT CHEST FINDINGS Cardiovascular: Mild atherosclerosis the thoracic aorta. Mediastinum/Nodes: Moderate-sized hiatal hernia containing oral contrast medium. No pathologic adenopathy. Lungs/Pleura: Mild biapical pleuroparenchymal scarring with associated calcifications. Musculoskeletal: Thoracic spondylosis. Chronic T11 compression fracture.  Chronic endplate compressions at T2. CT ABDOMEN PELVIS FINDINGS Hepatobiliary: Cholecystectomy. Otherwise unremarkable. No findings of metastatic disease to the liver. Pancreas: Unremarkable Spleen: Unremarkable Adrenals/Urinary Tract: Pelvic floor laxity with cystocele. 3 mm right kidney upper pole nonobstructive renal calculus. Hypodense lesion in the right mid kidney measures fluid density and is likely a cyst. Adrenal glands unremarkable. Stomach/Bowel: Moderate-sized hiatal hernia. This a clip along the lumen of ascending colon on image 76 of series 2. No obvious wall thickening of the adjacent colon or abnormal surrounding lymph nodes. Vascular/Lymphatic: Aortoiliac atherosclerotic vascular disease. No pathologic adenopathy  is observed. Reproductive: Uterus absent. Low position of the vagina due to pelvic floor laxity. Adnexa unremarkable. Other: No supplemental non-categorized findings. Musculoskeletal: Pelvic floor laxity. Bony demineralization. Hemangioma L5 vertebral body. IMPRESSION: 1. No findings of metastatic disease to the chest, abdomen, or pelvis. 2. Moderate-sized hiatal hernia containing oral contrast medium. 3. 3 mm right kidney upper pole nonobstructive renal calculus. 4. Pelvic floor laxity with cystocele. 5. Chronic T11 and T2 compression fractures. Bony demineralization. 6. Aortic atherosclerosis. Aortic Atherosclerosis (ICD10-I70.0). Electronically Signed   By: Van Clines M.D.   On: 04/16/2020 16:54         CMP Latest Ref Rng & Units 04/07/2020  Glucose 70 - 99 mg/dL 105(H)  BUN 8 - 23 mg/dL 18  Creatinine 0.44 - 1.00 mg/dL 0.96  Sodium 135 - 145 mmol/L 140  Potassium 3.5 - 5.1 mmol/L 4.2  Chloride 98 - 111 mmol/L 104  CO2 22 - 32 mmol/L 25  Calcium 8.9 - 10.3 mg/dL 9.1  Total Protein 6.5 - 8.1 g/dL 7.5  Total Bilirubin 0.3 - 1.2 mg/dL 0.6  Alkaline Phos 38 - 126 U/L 64  AST 15 - 41 U/L 19  ALT 0 - 44 U/L 16   CBC Latest Ref Rng & Units 04/07/2020  WBC 4.0 - 10.5 K/uL 6.2  Hemoglobin 12.0 - 15.0 g/dL 11.9(L)  Hematocrit 36.0 - 46.0 % 37.3  Platelets 150 - 400 K/uL 219    Assessment and plan: Patient is a 79 year old female noted to have at least intramucosal adenocarcinoma on screening colonoscopy in the ascending colon.  CT chest abdomen pelvis with contrast does not show any evidence of distant metastatic disease.Biopsy showed at least intramucosal adenocarcinoma but given that it was a small biopsy specimen patient likely has more tumor left behind and would need definitive right colectomy.  I am referring the patient to Dr. Bary Castilla who has seen her in the past for her breast cancer.  I will tentatively see her back in 4 weeks time after surgery to discuss final pathology  results and further management  Follow-up instructions: As above  I discussed the assessment and treatment plan with the patient. The patient was provided an opportunity to ask questions and all were answered. The patient agreed with the plan and demonstrated an understanding of the instructions.   The patient was advised to call back or seek an in-person evaluation if the symptoms worsen or if the condition fails to improve as anticipated.   Visit Diagnosis: 1. Malignant neoplasm of ascending colon (HCC)     Dr. Randa Evens, MD, MPH [facility] Tel- [phone] 04/24/2020 1:26 PM

## 2020-04-24 NOTE — Telephone Encounter (Signed)
Called patient at the request of Dr. Janese Banks to schedule an in person follow up the first week of January.  Patients daughter Loletha Carrow does not want to schedule due to that being 3 weeks post surgery.  Dr. Janese Banks notified and states she will follow up after surgery.

## 2020-04-26 ENCOUNTER — Other Ambulatory Visit: Payer: Self-pay | Admitting: General Surgery

## 2020-04-26 NOTE — Progress Notes (Signed)
Subjective:     Patient ID: Tammy Cannon is a 79 y.o. female.  HPI  The following portions of the patient's history were reviewed and updated as appropriate.  This an established patient is here today for: office visit. Patient has been referred today by Dr. Janese Banks for evaluation of newly diagnosed colon cancer. She reports this was found on her colonoscopy done by Dr. Vicente Males.  The patient had been having some perianal discomfort, primarily manifested as a pressure sensation especially after bowel movements.  Her daughter chimed in that she is also had some rectal bleeding, which by the patient reports been intermittent over decades.  She had trouble with internal hemorrhoids in the distant past but none reported at the time of 2015 colonoscopy.  The patient reports no change in her diet or appetite.  No postprandial discomfort.  The patient has had some right flank and back pain.  Record review shows evaluation for a kidney stone earlier this year.  The patient is not aware of any changes in her own breast exam.  The patient was last evaluated for breast cancer and had surgery in 2018 .   Patient is accompanied today by her daughter, Loletha Carrow.   The patient continues to work full-time.  She does valve assembly for propane tanks.  Most of her work is sitting but some of it involves picking up trays of parts up to 60 pounds in weight.      Chief Complaint  Patient presents with  . Treatment Plan Discussion    colon cancer     BP 152/88   Pulse 67   Temp 36.4 C (97.5 F)   Ht 162.6 cm (_0 )   Wt 60.8 kg (134 lb)   SpO2 99%   BMI 23.00 kg/m       Past Medical History:  Diagnosis Date  . Breast cancer (CMS-HCC) 08/19/2016   DCIS  . Cardiomegaly   . Complication of anesthesia   . Coronary atherosclerosis of native coronary artery   . COVID-19   . Depression   . Diabetes (CMS-HCC)   . GERD (gastroesophageal reflux disease)   . History of kidney stones   .  HLD (hyperlipidemia)   . Hypertension   . Hypothyroidism   . Mitral valve insufficiency   . Other specified pulmonary heart diseases (CMS-HCC)   . Panic attacks   . PONV (postoperative nausea and vomiting)   . Renal lithiasis   . Tricuspid valve insufficiency           Past Surgical History:  Procedure Laterality Date  . ABDOMINAL HYSTERECTOMY    . BREAST EXCISIONAL BIOPSY Left 08/19/2016  . CHOLECYSTECTOMY    . COLONOSCOPY  05/03/2014   Polyp resected, not retrieved & FH Colon Polyps - repeat 5 years per Dr. Rayann Heman  . COLONOSCOPY  04/03/2020  . MASTECTOMY SIMPLE Left 09/13/2016  . MASTECTOMY SURGERY Right               OB History    Gravida  1   Para  1   Term      Preterm      AB      Living        SAB      IAB      Ectopic      Molar      Multiple      Live Births          Obstetric Comments  Age at first  period 18 Age of first pregnancy 41        Social History          Socioeconomic History  . Marital status: Widowed    Spouse name: Not on file  . Number of children: Not on file  . Years of education: Not on file  . Highest education level: Not on file  Occupational History  . Not on file  Tobacco Use  . Smoking status: Never Smoker  . Smokeless tobacco: Never Used  Substance and Sexual Activity  . Alcohol use: No  . Drug use: No  . Sexual activity: Not on file  Other Topics Concern  . Not on file  Social History Narrative  . Not on file   Social Determinants of Health   Financial Resource Strain: Not on file  Food Insecurity: Not on file  Transportation Needs: Not on file       No Known Allergies  Current Medications        Current Outpatient Medications  Medication Sig Dispense Refill  . aspirin 81 mg Cap Take by mouth    . levothyroxine (SYNTHROID, LEVOTHROID) 88 MCG tablet Take 88 mcg by mouth once daily. Take on an empty stomach with a glass of water at least 30-60 minutes  before breakfast.    . metFORMIN (GLUCOPHAGE) 1000 MG tablet     . pantoprazole (PROTONIX) 40 MG DR tablet Take 40 mg by mouth as needed.    . pravastatin (PRAVACHOL) 40 MG tablet Take 40 mg by mouth nightly.    . trandolapril (MAVIK) 2 MG tablet Take 2 mg by mouth once daily.    . traZODone (DESYREL) 50 MG tablet Take 50 mg by mouth nightly.    . verapamil (VERELAN) 240 MG SR capsule Take 240 mg by mouth nightly.    . codeine-guaifenesin 10-100 mg/5 mL oral liquid Take 5 mLs by mouth every 4 (four) hours as needed (Patient not taking: Reported on 04/22/2020  ) 120 mL 0  . fluticasone (FLONASE) 50 mcg/actuation nasal spray Place 1 spray into both nostrils 2 (two) times daily (Patient not taking: Reported on 04/22/2020  ) 16 g 0  . linaclotide 145 mcg Take 1 capsule by mouth as needed. (Patient not taking: Reported on 04/22/2020  )     No current facility-administered medications for this visit.           Family History  Problem Relation Age of Onset  . Breast cancer Sister   . Colon cancer Sister   . High blood pressure (Hypertension) Other   . Breast cancer Other       Review of Systems  Constitutional: Negative for chills and fever.  Respiratory: Negative for cough.        Objective:   Physical Exam Exam conducted with a chaperone present.  Constitutional:      Appearance: Normal appearance.  HENT:     Head: Normocephalic.  Eyes:     General: No scleral icterus. Cardiovascular:     Rate and Rhythm: Normal rate and regular rhythm.     Pulses: Normal pulses.          Femoral pulses are 2+ on the right side and 2+ on the left side.    Heart sounds: Normal heart sounds.     Comments: Compressive hose in place.  While the patient reports a history of a cardiac murmur none is appreciated today. Pulmonary:     Effort: Pulmonary effort is normal.  Breath sounds: Normal breath sounds.    Chest:  Breasts:     Left: No supraclavicular  adenopathy.    Abdominal:     General: Abdomen is flat. Bowel sounds are normal.     Palpations: There is no mass.  Musculoskeletal:     Cervical back: Neck supple.     Right lower leg: No edema.     Left lower leg: No edema.  Lymphadenopathy:     Cervical: No cervical adenopathy.     Upper Body:     Left upper body: No supraclavicular adenopathy.     Lower Body: No right inguinal adenopathy. No left inguinal adenopathy.  Skin:    General: Skin is warm and dry.     Capillary Refill: Capillary refill takes less than 2 seconds.  Neurological:     Mental Status: She is alert and oriented to person, place, and time.  Psychiatric:        Mood and Affect: Mood normal.        Behavior: Behavior normal.        Thought Content: Thought content normal.        Judgment: Judgment normal.    Labs and Radiology:   May 03, 2014 colonoscopy completed by Kathrin Ruddy, MD was reviewed: 6 mm flat polyp in the cecum.  Cold snare.  Pathology showed only fecal debris.  April 03, 2020 colonoscopy completed by Dr. Vicente Males was reviewed: The mid ascending colon did not "lift" with submucosal injection.  Biopsy completed. DIAGNOSIS:  A. COLON POLYP, ASCENDING; COLD SNARE:  - TUBULAR ADENOMA.  - NEGATIVE FOR HIGH-GRADE DYSPLASIA AND MALIGNANCY.   B. COLON POLYP, ASCENDING; COLD BIOPSY:  - INTRAMUCOSAL ADENOCARCINOMA AT LEAST; SEE COMMENT.   Comment:  The endoscopic impression of a 20 mm polyp is noted. While there is no  definitive evidence of invasion in this biopsy, a more clinically  significant underlying lesion cannot be excluded.   April 07, 2018 CEA:  CEA 0.0 - 4.7 ng/mL 1.9    CBC was notable for a hemoglobin of 11.9 with an MCV of 86.  Normal white blood cell count.  Platelet count of 219,000.  Hemoglobin unchanged over the last year. Normal comprehensive metabolic panel.  (Nonfasting blood sugar 105). Low ferritin at 8, iron low normal at 35.  Decreased  saturation.  CT of the chest abdomen and pelvis dated April 16, 2020:   IMPRESSION: 1. No findings of metastatic disease to the chest, abdomen, or pelvis. 2. Moderate-sized hiatal hernia containing oral contrast medium. 3. 3 mm right kidney upper pole nonobstructive renal calculus. 4. Pelvic floor laxity with cystocele. 5. Chronic T11 and T2 compression fractures. Bony demineralization. 6. Aortic atherosclerosis.  The study was independently reviewed.  The right colon and hepatic flexure extend up into the area previously occupied by the gallbladder.  \The transverse colon is low-lying essentially at the level of the umbilicus.  Medical oncology notes of April 07, 2020 were reviewed.      Assessment:     Intramucosal (minimal) carcinoma the ascending colon.  No evidence of metastatic disease.  Hiatal hernia, asymptomatic.  Past history breast cancer, without issue at present.    Plan:     Indications for right colectomy were reviewed.  The patient reports at the time of her laparoscopic hysterectomy the right ovary was left because of dense scarring.  This is not visualized on her recent CT scans.  The hepatic flexure is snuggled up with the undersurface of  the liver, hopefully there will be much scarring in this area.  Indications for bowel prep reviewed.  Likelihood of metastatic disease is tiny.  Hopefully this will be just an intramucosal lesion but a formal right colectomy will be completed.  Anticipated length of stay is 31 days based on the patient's participation in recovery with early ambulation pulmonary toilet.  Risks of surgery reviewed.  She desires to have this completed before the end of the year as she has had her medical deductible met.  This may not be possible with the down scheduling this year and anticipated holiday downtime.  Surgery to be arranged.     Entered by Ledell Noss, CMA, acting as a scribe for Dr. [name], MD.    The documentation recorded by the scribe accurately reflects the service I personally performed and the decisions made by me.   [name], MD FACS

## 2020-04-29 ENCOUNTER — Encounter
Admission: RE | Admit: 2020-04-29 | Discharge: 2020-04-29 | Disposition: A | Discharge status: Home / Self Care | Payer: Medicare Other | Source: Ambulatory Visit | Attending: General Surgery | Admitting: General Surgery

## 2020-04-29 DIAGNOSIS — Z20822 Contact with and (suspected) exposure to covid-19: Secondary | ICD-10-CM | POA: Diagnosis not present

## 2020-04-29 DIAGNOSIS — R54 Age-related physical debility: Secondary | ICD-10-CM | POA: Insufficient documentation

## 2020-04-29 DIAGNOSIS — I10 Essential (primary) hypertension: Secondary | ICD-10-CM | POA: Diagnosis not present

## 2020-04-29 DIAGNOSIS — Z01818 Encounter for other preprocedural examination: Secondary | ICD-10-CM | POA: Insufficient documentation

## 2020-04-29 HISTORY — DX: Pneumonia, unspecified organism: J18.9

## 2020-04-29 HISTORY — DX: Dyspnea, unspecified: R06.00

## 2020-04-29 HISTORY — DX: Cardiac murmur, unspecified: R01.1

## 2020-04-29 HISTORY — DX: Anemia, unspecified: D64.9

## 2020-04-29 NOTE — Patient Instructions (Addendum)
Your procedure is scheduled on:05/05/20- Monday Report to the Registration Desk on the 1st floor of the [facility]. To find out your arrival time, please call [phone] between 1PM - 3PM on: 05/02/20-  Friday  REMEMBER: Instructions that are not followed completely may result in serious medical risk, up to and including death; or upon the discretion of your surgeon and anesthesiologist your surgery may need to be rescheduled.  Follow Diet and Bowel Prep orders given to you by Dr. Dwyane Luo office.   TAKE THESE MEDICATIONS THE MORNING OF SURGERY WITH A SIP OF WATER: - levothyroxine (SYNTHROID) 100 MCG tablet - pantoprazole (PROTONIX) 40 MG tablet, take one the night before and one on the morning of surgery - helps to prevent nausea after surgery.  Stop metFORMIN (GLUCOPHAGE) 1000 MG tablet 2 days prior to surgery. 12/18, 12/19 , 12/20.  Follow recommendations from Cardiologist, Pulmonologist or PCP regarding stopping Aspirin, Coumadin, Plavix, Eliquis, Pradaxa, or Pletal. Per patient stopped taking 04/28/20.  One week prior to surgery: Stop Anti-inflammatories (NSAIDS) such as Advil, Aleve, Ibuprofen, Motrin, Naproxen, Naprosyn and Aspirin based products such as Excedrin, Goodys Powder, BC Powder.  Stop ANY OVER THE COUNTER supplements until after surgery. (However, you may continue taking Vitamin D, Vitamin B, and multivitamin up until the day before surgery.)  No Alcohol for 24 hours before or after surgery.  No Smoking including e-cigarettes for 24 hours prior to surgery.  No chewable tobacco products for at least 6 hours prior to surgery.  No nicotine patches on the day of surgery.  Do not use any "recreational" drugs for at least a week prior to your surgery.  Please be advised that the combination of cocaine and anesthesia may have negative outcomes, up to and including death. If you test positive for cocaine, your surgery will be cancelled.  On the morning of surgery  brush your teeth with toothpaste and water, you may rinse your mouth with mouthwash if you wish. Do not swallow any toothpaste or mouthwash.  Do not wear jewelry, make-up, hairpins, clips or nail polish.  Do not wear lotions, powders, or perfumes.   Do not shave body from the neck down 48 hours prior to surgery just in case you cut yourself which could leave a site for infection.  Also, freshly shaved skin may become irritated if using the CHG soap.  Contact lenses, hearing aids and dentures may not be worn into surgery.  Do not bring valuables to the hospital. [facility] is not responsible for any missing/lost belongings or valuables.   Use CHG Soap or wipes as directed on instruction sheet.  Notify your doctor if there is any change in your medical condition (cold, fever, infection).  Wear comfortable clothing (specific to your surgery type) to the hospital.  Plan for stool softeners for home use; pain medications have a tendency to cause constipation. You can also help prevent constipation by eating foods high in fiber such as fruits and vegetables and drinking plenty of fluids as your diet allows.  After surgery, you can help prevent lung complications by doing breathing exercises.  Take deep breaths and cough every 1-2 hours. Your doctor may order a device called an Incentive Spirometer to help you take deep breaths. When coughing or sneezing, hold a pillow firmly against your incision with both hands. This is called "splinting." Doing this helps protect your incision. It also decreases belly discomfort.  If you are being admitted to the hospital overnight, leave your suitcase in  the car. After surgery it may be brought to your room.  If you are being discharged the day of surgery, you will not be allowed to drive home. You will need a responsible adult (18 years or older) to drive you home and stay with you that night.   If you are taking public transportation, you will need  to have a responsible adult (18 years or older) with you. Please confirm with your physician that it is acceptable to use public transportation.   Please call the [facility] at [phone] if you have any questions about these instructions.  Visitation Policy:  Patients undergoing a surgery or procedure may have one family member or support person with them as long as that person is not COVID-19 positive or experiencing its symptoms.  That person may remain in the waiting area during the procedure.  Inpatient Visitation Update:   In an effort to ensure the safety of our team members and our patients, we are implementing a change to our visitation policy:  Effective Monday, Aug. 9, at 7 a.m., inpatients will be allowed one support person.  o The support person may change daily.  o The support person must pass our screening, gel in and out, and wear a mask at all times, including in the patient's room.  o Patients must also wear a mask when staff or their support person are in the room.  o Masking is required regardless of vaccination status.  Systemwide, no visitors 17 or younger.

## 2020-05-01 ENCOUNTER — Encounter
Admission: RE | Admit: 2020-05-01 | Discharge: 2020-05-01 | Disposition: A | Discharge status: Home / Self Care | Payer: PRIVATE HEALTH INSURANCE | Source: Ambulatory Visit | Attending: General Surgery | Admitting: General Surgery

## 2020-05-01 ENCOUNTER — Other Ambulatory Visit: Payer: Self-pay

## 2020-05-01 DIAGNOSIS — Z20822 Contact with and (suspected) exposure to covid-19: Secondary | ICD-10-CM | POA: Diagnosis not present

## 2020-05-01 DIAGNOSIS — I10 Essential (primary) hypertension: Secondary | ICD-10-CM | POA: Diagnosis not present

## 2020-05-01 DIAGNOSIS — Z01818 Encounter for other preprocedural examination: Secondary | ICD-10-CM | POA: Diagnosis not present

## 2020-05-01 DIAGNOSIS — R54 Age-related physical debility: Secondary | ICD-10-CM | POA: Diagnosis not present

## 2020-05-01 LAB — SURGICAL PCR SCREEN
MRSA, PCR: NEGATIVE
Staphylococcus aureus: NEGATIVE

## 2020-05-01 LAB — SARS CORONAVIRUS 2 (TAT 6-24 HRS): SARS Coronavirus 2: NEGATIVE

## 2020-05-03 LAB — TYPE AND SCREEN
ABO/RH(D): O POS
Antibody Screen: NEGATIVE

## 2020-05-04 MED ORDER — SODIUM CHLORIDE 0.9 % IV SOLN: INTRAVENOUS | Status: DC

## 2020-05-04 MED ORDER — ALVIMOPAN 12 MG PO CAPS: 12.0000 mg | ORAL_CAPSULE | ORAL | Status: AC

## 2020-05-04 MED ORDER — SODIUM CHLORIDE 0.9 % IV SOLN
1.0000 g | INTRAVENOUS | Status: AC
  Administered 2020-05-05: 1 g via INTRAVENOUS
  Filled 2020-05-04: qty 1

## 2020-05-04 MED ORDER — CHLORHEXIDINE GLUCONATE 0.12 % MT SOLN: 15.0000 mL | Freq: Once | OROMUCOSAL | Status: AC

## 2020-05-04 MED ORDER — ORAL CARE MOUTH RINSE: 15.0000 mL | Freq: Once | OROMUCOSAL | Status: AC

## 2020-05-05 ENCOUNTER — Inpatient Hospital Stay
Admission: RE | Admit: 2020-05-05 | Discharge: 2020-05-07 | DRG: 331 | Disposition: A | Discharge status: Home / Self Care | Payer: No Typology Code available for payment source | Attending: General Surgery | Admitting: General Surgery

## 2020-05-05 ENCOUNTER — Inpatient Hospital Stay: Payer: No Typology Code available for payment source | Admitting: Anesthesiology

## 2020-05-05 ENCOUNTER — Other Ambulatory Visit: Payer: Self-pay

## 2020-05-05 ENCOUNTER — Encounter: Payer: Self-pay | Admitting: General Surgery

## 2020-05-05 ENCOUNTER — Encounter
Admission: RE | Disposition: A | Discharge status: Home / Self Care | Payer: Self-pay | Source: Home / Self Care | Attending: General Surgery

## 2020-05-05 DIAGNOSIS — C182 Malignant neoplasm of ascending colon: Principal | ICD-10-CM | POA: Diagnosis present

## 2020-05-05 DIAGNOSIS — E785 Hyperlipidemia, unspecified: Secondary | ICD-10-CM | POA: Diagnosis not present

## 2020-05-05 DIAGNOSIS — E119 Type 2 diabetes mellitus without complications: Secondary | ICD-10-CM | POA: Diagnosis not present

## 2020-05-05 DIAGNOSIS — Z79899 Other long term (current) drug therapy: Secondary | ICD-10-CM

## 2020-05-05 DIAGNOSIS — I10 Essential (primary) hypertension: Secondary | ICD-10-CM | POA: Diagnosis not present

## 2020-05-05 DIAGNOSIS — E7849 Other hyperlipidemia: Secondary | ICD-10-CM | POA: Diagnosis not present

## 2020-05-05 DIAGNOSIS — Z9221 Personal history of antineoplastic chemotherapy: Secondary | ICD-10-CM

## 2020-05-05 DIAGNOSIS — Z9011 Acquired absence of right breast and nipple: Secondary | ICD-10-CM | POA: Diagnosis not present

## 2020-05-05 DIAGNOSIS — Z853 Personal history of malignant neoplasm of breast: Secondary | ICD-10-CM | POA: Diagnosis not present

## 2020-05-05 DIAGNOSIS — Z7982 Long term (current) use of aspirin: Secondary | ICD-10-CM | POA: Diagnosis not present

## 2020-05-05 DIAGNOSIS — Z8616 Personal history of COVID-19: Secondary | ICD-10-CM

## 2020-05-05 DIAGNOSIS — I2510 Atherosclerotic heart disease of native coronary artery without angina pectoris: Secondary | ICD-10-CM | POA: Diagnosis present

## 2020-05-05 DIAGNOSIS — Z79890 Hormone replacement therapy (postmenopausal): Secondary | ICD-10-CM | POA: Diagnosis not present

## 2020-05-05 DIAGNOSIS — E039 Hypothyroidism, unspecified: Secondary | ICD-10-CM | POA: Diagnosis present

## 2020-05-05 DIAGNOSIS — K219 Gastro-esophageal reflux disease without esophagitis: Secondary | ICD-10-CM | POA: Diagnosis present

## 2020-05-05 DIAGNOSIS — C189 Malignant neoplasm of colon, unspecified: Secondary | ICD-10-CM | POA: Diagnosis present

## 2020-05-05 DIAGNOSIS — Z7984 Long term (current) use of oral hypoglycemic drugs: Secondary | ICD-10-CM

## 2020-05-05 DIAGNOSIS — Z8701 Personal history of pneumonia (recurrent): Secondary | ICD-10-CM

## 2020-05-05 DIAGNOSIS — K635 Polyp of colon: Secondary | ICD-10-CM | POA: Diagnosis present

## 2020-05-05 HISTORY — PX: LAPAROSCOPIC RIGHT COLECTOMY: SHX5925

## 2020-05-05 LAB — GLUCOSE, CAPILLARY
Glucose-Capillary: 118 mg/dL — ABNORMAL HIGH (ref 70–99)
Glucose-Capillary: 174 mg/dL — ABNORMAL HIGH (ref 70–99)
Glucose-Capillary: 162 mg/dL — ABNORMAL HIGH (ref 70–99)

## 2020-05-05 LAB — ABO/RH: ABO/RH(D): O POS

## 2020-05-05 SURGERY — COLECTOMY, RIGHT, LAPAROSCOPIC
Anesthesia: General | Site: Abdomen | Laterality: Right

## 2020-05-05 MED ORDER — VERAPAMIL HCL ER 240 MG PO TBCR
240.0000 mg | EXTENDED_RELEASE_TABLET | Freq: Every day | ORAL | Status: DC
  Administered 2020-05-05 – 2020-05-07 (×2): 240 mg via ORAL
  Filled 2020-05-05 (×2): qty 1

## 2020-05-05 MED ORDER — PROPOFOL 10 MG/ML IV BOLUS
INTRAVENOUS | Status: DC | PRN
  Administered 2020-05-05: 100 mg via INTRAVENOUS

## 2020-05-05 MED ORDER — LIDOCAINE HCL (CARDIAC) PF 100 MG/5ML IV SOSY
PREFILLED_SYRINGE | INTRAVENOUS | Status: DC | PRN
  Administered 2020-05-05: 80 mg via INTRAVENOUS

## 2020-05-05 MED ORDER — HYDRALAZINE HCL 20 MG/ML IJ SOLN
INTRAMUSCULAR | Status: DC | PRN
  Administered 2020-05-05: 10 mg via INTRAVENOUS

## 2020-05-05 MED ORDER — SUGAMMADEX SODIUM 200 MG/2ML IV SOLN
INTRAVENOUS | Status: DC | PRN
  Administered 2020-05-05: 200 mg via INTRAVENOUS

## 2020-05-05 MED ORDER — ACETAMINOPHEN 10 MG/ML IV SOLN
INTRAVENOUS | Status: DC | PRN
  Administered 2020-05-05: 1000 mg via INTRAVENOUS

## 2020-05-05 MED ORDER — ONDANSETRON 4 MG PO TBDP: 4.0000 mg | ORAL_TABLET | ORAL | Status: DC | PRN

## 2020-05-05 MED ORDER — FENTANYL CITRATE (PF) 100 MCG/2ML IJ SOLN
INTRAMUSCULAR | Status: DC | PRN
  Administered 2020-05-05 (×4): 25 ug via INTRAVENOUS
  Administered 2020-05-05 (×2): 50 ug via INTRAVENOUS

## 2020-05-05 MED ORDER — FENTANYL CITRATE (PF) 100 MCG/2ML IJ SOLN
INTRAMUSCULAR | Status: AC
  Filled 2020-05-05: qty 2

## 2020-05-05 MED ORDER — ACETAMINOPHEN 10 MG/ML IV SOLN
1000.0000 mg | Freq: Four times a day (QID) | INTRAVENOUS | Status: DC
  Administered 2020-05-05 – 2020-05-06 (×3): 1000 mg via INTRAVENOUS
  Filled 2020-05-05 (×4): qty 100

## 2020-05-05 MED ORDER — TRANDOLAPRIL 4 MG PO TABS
4.0000 mg | ORAL_TABLET | Freq: Every day | ORAL | Status: DC
  Administered 2020-05-06 – 2020-05-07 (×2): 4 mg via ORAL
  Filled 2020-05-05 (×2): qty 1

## 2020-05-05 MED ORDER — ALVIMOPAN 12 MG PO CAPS
12.0000 mg | ORAL_CAPSULE | Freq: Two times a day (BID) | ORAL | Status: DC
  Administered 2020-05-06: 12 mg via ORAL
  Filled 2020-05-05 (×4): qty 1

## 2020-05-05 MED ORDER — DEXAMETHASONE SODIUM PHOSPHATE 10 MG/ML IJ SOLN
INTRAMUSCULAR | Status: DC | PRN
  Administered 2020-05-05: 10 mg via INTRAVENOUS

## 2020-05-05 MED ORDER — FENTANYL CITRATE (PF) 100 MCG/2ML IJ SOLN
25.0000 ug | INTRAMUSCULAR | Status: DC | PRN
  Administered 2020-05-05: 25 ug via INTRAVENOUS

## 2020-05-05 MED ORDER — DEXAMETHASONE SODIUM PHOSPHATE 10 MG/ML IJ SOLN
INTRAMUSCULAR | Status: AC
  Filled 2020-05-05: qty 1

## 2020-05-05 MED ORDER — MORPHINE SULFATE (PF) 2 MG/ML IV SOLN: 2.0000 mg | INTRAVENOUS | Status: DC | PRN

## 2020-05-05 MED ORDER — DEXMEDETOMIDINE (PRECEDEX) IN NS 20 MCG/5ML (4 MCG/ML) IV SYRINGE
PREFILLED_SYRINGE | INTRAVENOUS | Status: DC | PRN
  Administered 2020-05-05: 4 ug via INTRAVENOUS
  Administered 2020-05-05: 8 ug via INTRAVENOUS
  Administered 2020-05-05 (×2): 4 ug via INTRAVENOUS

## 2020-05-05 MED ORDER — CHLORHEXIDINE GLUCONATE CLOTH 2 % EX PADS: 6.0000 | MEDICATED_PAD | Freq: Once | CUTANEOUS | Status: DC

## 2020-05-05 MED ORDER — ONDANSETRON HCL 4 MG/2ML IJ SOLN
INTRAMUSCULAR | Status: AC
  Filled 2020-05-05: qty 2

## 2020-05-05 MED ORDER — CHLORHEXIDINE GLUCONATE 0.12 % MT SOLN
OROMUCOSAL | Status: AC
  Administered 2020-05-05: 15 mL via OROMUCOSAL
  Filled 2020-05-05: qty 15

## 2020-05-05 MED ORDER — TRAZODONE HCL 50 MG PO TABS
50.0000 mg | ORAL_TABLET | Freq: Every day | ORAL | Status: DC
  Administered 2020-05-05 – 2020-05-06 (×2): 50 mg via ORAL
  Filled 2020-05-05 (×2): qty 1

## 2020-05-05 MED ORDER — OXYCODONE HCL 5 MG PO TABS
2.5000 mg | ORAL_TABLET | ORAL | Status: DC | PRN
  Administered 2020-05-05 – 2020-05-06 (×2): 2.5 mg via ORAL
  Filled 2020-05-05 (×2): qty 1

## 2020-05-05 MED ORDER — INSULIN ASPART 100 UNIT/ML ~~LOC~~ SOLN
0.0000 [IU] | Freq: Three times a day (TID) | SUBCUTANEOUS | Status: DC
  Administered 2020-05-06 – 2020-05-07 (×3): 2 [IU] via SUBCUTANEOUS
  Filled 2020-05-05 (×3): qty 1

## 2020-05-05 MED ORDER — BUPIVACAINE-EPINEPHRINE (PF) 0.5% -1:200000 IJ SOLN
INTRAMUSCULAR | Status: AC
  Filled 2020-05-05: qty 30

## 2020-05-05 MED ORDER — LIDOCAINE HCL (PF) 2 % IJ SOLN
INTRAMUSCULAR | Status: AC
  Filled 2020-05-05: qty 5

## 2020-05-05 MED ORDER — IBUPROFEN 400 MG PO TABS: 400.0000 mg | ORAL_TABLET | ORAL | Status: DC | PRN

## 2020-05-05 MED ORDER — ENSURE PRE-SURGERY PO LIQD
296.0000 mL | Freq: Once | ORAL | Status: DC
  Filled 2020-05-05: qty 296

## 2020-05-05 MED ORDER — LACTATED RINGERS IV SOLN: INTRAVENOUS | Status: DC

## 2020-05-05 MED ORDER — ASPIRIN EC 81 MG PO TBEC
81.0000 mg | DELAYED_RELEASE_TABLET | Freq: Every day | ORAL | Status: DC | PRN
Start: 2020-05-05 — End: 2020-05-07

## 2020-05-05 MED ORDER — HYDRALAZINE HCL 20 MG/ML IJ SOLN
INTRAMUSCULAR | Status: AC
  Filled 2020-05-05: qty 1

## 2020-05-05 MED ORDER — ACETAMINOPHEN 10 MG/ML IV SOLN
INTRAVENOUS | Status: AC
  Filled 2020-05-05: qty 100

## 2020-05-05 MED ORDER — DEXMEDETOMIDINE (PRECEDEX) IN NS 20 MCG/5ML (4 MCG/ML) IV SYRINGE
PREFILLED_SYRINGE | INTRAVENOUS | Status: AC
  Filled 2020-05-05: qty 5

## 2020-05-05 MED ORDER — PHENYLEPHRINE HCL (PRESSORS) 10 MG/ML IV SOLN
INTRAVENOUS | Status: DC | PRN
  Administered 2020-05-05: 100 ug via INTRAVENOUS

## 2020-05-05 MED ORDER — LABETALOL HCL 5 MG/ML IV SOLN
INTRAVENOUS | Status: AC
  Filled 2020-05-05: qty 4

## 2020-05-05 MED ORDER — ENOXAPARIN SODIUM 40 MG/0.4ML ~~LOC~~ SOLN
40.0000 mg | SUBCUTANEOUS | Status: DC
  Administered 2020-05-06 – 2020-05-07 (×2): 40 mg via SUBCUTANEOUS
  Filled 2020-05-05 (×2): qty 0.4

## 2020-05-05 MED ORDER — PROMETHAZINE HCL 25 MG/ML IJ SOLN: 6.2500 mg | Freq: Four times a day (QID) | INTRAMUSCULAR | Status: DC | PRN

## 2020-05-05 MED ORDER — FENTANYL CITRATE (PF) 100 MCG/2ML IJ SOLN
INTRAMUSCULAR | Status: AC
  Administered 2020-05-05: 25 ug via INTRAVENOUS
  Filled 2020-05-05: qty 2

## 2020-05-05 MED ORDER — ONDANSETRON HCL 4 MG/2ML IJ SOLN
INTRAMUSCULAR | Status: DC | PRN
  Administered 2020-05-05: 4 mg via INTRAVENOUS

## 2020-05-05 MED ORDER — ALVIMOPAN 12 MG PO CAPS
ORAL_CAPSULE | ORAL | Status: AC
  Administered 2020-05-05: 12 mg via ORAL
  Filled 2020-05-05: qty 1

## 2020-05-05 MED ORDER — LEVOTHYROXINE SODIUM 100 MCG PO TABS
100.0000 ug | ORAL_TABLET | Freq: Every day | ORAL | Status: DC
  Administered 2020-05-06 – 2020-05-07 (×2): 100 ug via ORAL
  Filled 2020-05-05 (×2): qty 1

## 2020-05-05 MED ORDER — MEPERIDINE HCL 50 MG/ML IJ SOLN
6.2500 mg | INTRAMUSCULAR | Status: DC | PRN
Start: 2020-05-05 — End: 2020-05-05

## 2020-05-05 MED ORDER — BUPIVACAINE-EPINEPHRINE (PF) 0.5% -1:200000 IJ SOLN
INTRAMUSCULAR | Status: DC | PRN
  Administered 2020-05-05: 30 mL

## 2020-05-05 MED ORDER — ONDANSETRON HCL 4 MG/2ML IJ SOLN: 4.0000 mg | Freq: Once | INTRAMUSCULAR | Status: DC | PRN

## 2020-05-05 MED ORDER — CEFAZOLIN SODIUM 1 G IJ SOLR
INTRAMUSCULAR | Status: AC
  Filled 2020-05-05: qty 10

## 2020-05-05 MED ORDER — ROCURONIUM BROMIDE 100 MG/10ML IV SOLN
INTRAVENOUS | Status: DC | PRN
  Administered 2020-05-05: 10 mg via INTRAVENOUS
  Administered 2020-05-05: 50 mg via INTRAVENOUS
  Administered 2020-05-05: 10 mg via INTRAVENOUS
  Administered 2020-05-05: 20 mg via INTRAVENOUS

## 2020-05-05 MED ORDER — LABETALOL HCL 5 MG/ML IV SOLN
INTRAVENOUS | Status: DC | PRN
  Administered 2020-05-05 (×2): 5 mg via INTRAVENOUS
  Administered 2020-05-05: 10 mg via INTRAVENOUS

## 2020-05-05 MED ORDER — PROPOFOL 10 MG/ML IV BOLUS
INTRAVENOUS | Status: AC
  Filled 2020-05-05: qty 20

## 2020-05-05 SURGICAL SUPPLY — 71 items
APPLIER CLIP ROT 10 11.4 M/L (STAPLE)
BENZOIN TINCTURE PRP APPL 2/3 (GAUZE/BANDAGES/DRESSINGS) ×2 IMPLANT
BLADE SURG 10 STRL SS SAFETY (BLADE) ×2 IMPLANT
BLADE SURG 11 STRL SS SAFETY (MISCELLANEOUS) ×2 IMPLANT
CANISTER SUCT 1200ML W/VALVE (MISCELLANEOUS) IMPLANT
CANNULA DILATOR 10 W/SLV (CANNULA) ×2 IMPLANT
CHLORAPREP W/TINT 26 (MISCELLANEOUS) ×2 IMPLANT
CLIP APPLIE ROT 10 11.4 M/L (STAPLE) IMPLANT
COVER CLAMP SIL LG PBX B (MISCELLANEOUS) IMPLANT
COVER WAND RF STERILE (DRAPES) ×2 IMPLANT
DRAPE LAP W/FLUID (DRAPES) IMPLANT
DRAPE UNDER BUTTOCK W/FLU (DRAPES) IMPLANT
DRSG OPSITE POSTOP 4X10 (GAUZE/BANDAGES/DRESSINGS) IMPLANT
DRSG OPSITE POSTOP 4X8 (GAUZE/BANDAGES/DRESSINGS) IMPLANT
DRSG TEGADERM 2-3/8X2-3/4 SM (GAUZE/BANDAGES/DRESSINGS) IMPLANT
DRSG TEGADERM 4X10 (GAUZE/BANDAGES/DRESSINGS) ×2 IMPLANT
DRSG TEGADERM 4X4.75 (GAUZE/BANDAGES/DRESSINGS) ×2 IMPLANT
DRSG TELFA 3X8 NADH (GAUZE/BANDAGES/DRESSINGS) ×2 IMPLANT
ELECT BLADE 6.5 EXT (BLADE) ×2 IMPLANT
ELECT CAUTERY BLADE 6.4 (BLADE) ×2 IMPLANT
ELECT REM PT RETURN 9FT ADLT (ELECTROSURGICAL) ×2
ELECTRODE REM PT RTRN 9FT ADLT (ELECTROSURGICAL) ×1 IMPLANT
GLOVE BIO SURGEON STRL SZ7.5 (GLOVE) ×6 IMPLANT
GLOVE INDICATOR 8.0 STRL GRN (GLOVE) ×4 IMPLANT
GOWN STRL REUS W/ TWL LRG LVL3 (GOWN DISPOSABLE) ×6 IMPLANT
GOWN STRL REUS W/TWL LRG LVL3 (GOWN DISPOSABLE) ×6
HANDLE YANKAUER SUCT BULB TIP (MISCELLANEOUS) ×2 IMPLANT
HOLDER FOLEY CATH W/STRAP (MISCELLANEOUS) IMPLANT
IRRIGATION STRYKERFLOW (MISCELLANEOUS) IMPLANT
IRRIGATOR STRYKERFLOW (MISCELLANEOUS)
IV LACTATED RINGERS 1000ML (IV SOLUTION) IMPLANT
KIT PINK PAD W/HEAD ARE REST (MISCELLANEOUS) ×2
KIT PINK PAD W/HEAD ARM REST (MISCELLANEOUS) ×1 IMPLANT
KIT TURNOVER KIT A (KITS) ×2 IMPLANT
LABEL OR SOLS (LABEL) IMPLANT
LIGASURE LAP MARYLAND 5MM 37CM (ELECTROSURGICAL) ×2 IMPLANT
MANIFOLD NEPTUNE II (INSTRUMENTS) ×2 IMPLANT
NDL INSUFF ACCESS 14 VERSASTEP (NEEDLE) ×2 IMPLANT
NEEDLE HYPO 22GX1.5 SAFETY (NEEDLE) ×2 IMPLANT
NS IRRIG 500ML POUR BTL (IV SOLUTION) ×2 IMPLANT
PACK COLON CLEAN CLOSURE (MISCELLANEOUS) ×2 IMPLANT
PACK LAP CHOLECYSTECTOMY (MISCELLANEOUS) ×2 IMPLANT
PAD PREP 24X41 OB/GYN DISP (PERSONAL CARE ITEMS) IMPLANT
PENCIL ELECTRO HAND CTR (MISCELLANEOUS) ×2 IMPLANT
RELOAD PROXIMATE 75MM BLUE (ENDOMECHANICALS) ×2 IMPLANT
RETRACTOR WOUND ALXS 18CM MED (MISCELLANEOUS) ×1 IMPLANT
RTRCTR WOUND ALEXIS O 18CM MED (MISCELLANEOUS) ×2
SCISSORS METZENBAUM CVD 33 (INSTRUMENTS) ×2 IMPLANT
SET YANKAUER POOLE SUCT (MISCELLANEOUS) IMPLANT
SLEEVE ENDOPATH XCEL 5M (ENDOMECHANICALS) ×2 IMPLANT
SPONGE LAP 18X18 RF (DISPOSABLE) ×4 IMPLANT
STAPLER PROXIMATE 75MM BLUE (STAPLE) ×2 IMPLANT
STRIP CLOSURE SKIN 1/2X4 (GAUZE/BANDAGES/DRESSINGS) ×2 IMPLANT
SUT MAXON ABS #0 GS21 30IN (SUTURE) ×8 IMPLANT
SUT SILK 2 0 (SUTURE) ×1
SUT SILK 2-0 30XBRD TIE 12 (SUTURE) ×1 IMPLANT
SUT SILK 3-0 (SUTURE) ×4 IMPLANT
SUT VIC AB 0 CT1 36 (SUTURE) ×2 IMPLANT
SUT VIC AB 2-0 BRD 54 (SUTURE) ×4 IMPLANT
SUT VIC AB 2-0 CT1 27 (SUTURE) ×2
SUT VIC AB 2-0 CT1 TAPERPNT 27 (SUTURE) ×2 IMPLANT
SUT VIC AB 3-0 54X BRD REEL (SUTURE) ×2 IMPLANT
SUT VIC AB 3-0 BRD 54 (SUTURE) ×2
SUT VIC AB 3-0 SH 27 (SUTURE) ×2
SUT VIC AB 3-0 SH 27X BRD (SUTURE) ×2 IMPLANT
SUT VIC AB 4-0 FS2 27 (SUTURE) ×4 IMPLANT
SWABSTK COMLB BENZOIN TINCTURE (MISCELLANEOUS) IMPLANT
TRAY FOLEY MTR SLVR 16FR STAT (SET/KITS/TRAYS/PACK) IMPLANT
TROCAR XCEL NON-BLD 11X100MML (ENDOMECHANICALS) ×2 IMPLANT
TROCAR XCEL NON-BLD 5MMX100MML (ENDOMECHANICALS) ×2 IMPLANT
TUBING EVAC SMOKE HEATED PNEUM (TUBING) ×2 IMPLANT

## 2020-05-05 NOTE — Op Note (Signed)
Preoperative diagnosis: In situ carcinoma.  Possible invasive cancer.  Postoperative diagnosis: Same.  Operative procedure: Laparoscopically assisted right hemicolectomy with ileotransverse colostomy.  Operating surgeon: [name], MD.  Assistant: Elsie Stain, RNFA.  Anesthesia: General endotracheal, Marcaine 0.5% with 1: 200,000 units of epinephrine, 30 cc.  Estimated blood loss: 30 cc.  Clinical note: This 79 year old woman underwent a colonoscopy and was found to have a spreading polyp in the ascending colon.  Biopsy showed at least in situ carcinoma.  The gastroenterologist reported that the lesion would not "lift" and for that reason was like to proceed to right hemicolectomy.  Preoperative imaging was unremarkable.  Patient had SCD stockings for DVT prevention.  She received Invanz prior to the procedure.  Operative note: The patient underwent general endotracheal anesthesia and tolerated this well.  Left arm was tucked everything was padded.  Pillow behind the knees.  The abdomen was cleansed with ChloraPrep and draped.  In Trendelenburg position a varies needle was carefully placed through the transumbilical incision.  After assuring intra-abdominal location with a hanging drop test the abdomen was insufflated with CO2 at 10 mmHg pressure.  A 5 mm left upper quadrant port and an right lower quadrant 11 mm XL port were placed under direct vision.  The patient was known from previous imaging to have her hepatic flexure tucked up into the gallbladder fossa postcholecystectomy.  This was the most tedious portion of the procedure.  The omentum was freed and then the hepatic flexure taken down making use of the LigaSure device.  The appendix was down into the right hemipelvis and this was mobilized.  The terminal ileum was freed.  The white line of Toldt was divided and it was possible to roll the entire right colon and hepatic flexure past the midline.  At this time the patient was  abdomen was desufflated and ports removed.  Local anesthesia was infiltrated for postoperative comfort.  An 8 cm midline incision extending from just below the umbilicus superiorly was made.  The skin was incised sharply and the remaining dissection completed with electrocautery.  A medium Alexis wound protector was placed.  The right colon was easily brought into the wound.  The mesentery was taken down with the LigaSure device.  The right colic artery was controlled with a 2-0 silk tie and reinforced with a 3-0 silk suture ligature.  The right branch of middle colic vessel was treated in a similar fashion.  A side-to-side functional end-to-end anastomosis was created.  The ileum was sewn to the tinea of the transverse colon with interrupted 3-0 seromuscular sutures.  A GIA stapler was passed and fired.  Good hemostasis was noted.  A second tangential firing completed the anastomosis.  The mesentery was closed with a running 3-0 Vicryl suture.  A small mesenteric vessel required a 3-0 silk suture for hemostasis.  A wet lap had been placed in the right upper was removed and good hemostasis was noted.  The area was irrigated with saline with no unusual bleeding.  The wound protector and instruments were removed.  Gowns and gloves were changed.  The surgical incision site was cleansed with Betadine solution and new drapes applied.  The peritoneum was closed with a running 2-0 Vicryl suture.  The fascia was closed with interrupted 0 Maxon figure-of-eight sutures.  Adipose layer was closed with 2-0 Vicryl sutures.  The skin closed with a running 4-0 Vicryl subcuticular suture.  Trocar sites were treated in a similar fashion.  Palpation from inside  the abdomen prior to final closure showed no discernible defect at the 11 mm port site and fascial sutures were used.  The skin was approximated with Steri-Strips followed by a honeycomb dressing and Telfa and Tegaderm dressings for the port sites.  The patient tolerated the  procedure well and was taken recovery in stable condition.

## 2020-05-05 NOTE — Transfer of Care (Signed)
Immediate Anesthesia Transfer of Care Note  Patient: Tammy Cannon  Procedure(s) Performed: LAPAROSCOPIC RIGHT COLECTOMY (Right Abdomen)  Patient Location: PACU  Anesthesia Type:General  Level of Consciousness: drowsy  Airway & Oxygen Therapy: Patient Spontanous Breathing and Patient connected to face mask oxygen  Post-op Assessment: Report given to RN and Post -op Vital signs reviewed and stable  Post vital signs: Reviewed and stable  Last Vitals:  Vitals Value Taken Time  BP 141/63 05/05/20 1707  Temp    Pulse 57 05/05/20 1709  Resp 15 05/05/20 1709  SpO2 99 % 05/05/20 1709  Vitals shown include unvalidated device data.  Last Pain:  Vitals:   05/05/20 1241  TempSrc: Temporal  PainSc: 0-No pain         Complications: No complications documented.

## 2020-05-05 NOTE — Anesthesia Preprocedure Evaluation (Addendum)
Anesthesia Evaluation  Patient identified by MRN, date of birth, ID band Patient awake    Reviewed: Allergy & Precautions, H&P , NPO status , Patient's Chart, lab work & pertinent test results  History of Anesthesia Complications (+) PONVNegative for: history of anesthetic complications  Airway Mallampati: II  TM Distance: <3 FB     Dental no notable dental hx.    Pulmonary shortness of breath and with exertion, pneumonia, resolved,    Pulmonary exam normal        Cardiovascular Exercise Tolerance: Good hypertension, (-) angina+ CAD  (-) Past MI and (-) Cardiac Stents Normal cardiovascular exam(-) dysrhythmias + Valvular Problems/Murmurs  Rhythm:regular Rate:Normal     Neuro/Psych PSYCHIATRIC DISORDERS Anxiety negative neurological ROS     GI/Hepatic Neg liver ROS, GERD  Controlled,  Endo/Other  diabetes, Well Controlled, Type 2, Oral Hypoglycemic AgentsHypothyroidism   Renal/GU negative Renal ROS  negative genitourinary   Musculoskeletal   Abdominal Normal abdominal exam  (+)   Peds  Hematology negative hematology ROS (+) anemia ,   Anesthesia Other Findings Covid 19 2020 Resolved  Past Medical History: 1989: Breast cancer ([place])     Comment:  Right mastectomy /pos node Tammy Cannon /Dr Cerame No date: Cardiomegaly No date: Complication of anesthesia     Comment:  with colonoscopy No date: Coronary atherosclerosis of native coronary artery No date: COVID-19 08/19/2016: DCIS (ductal carcinoma in situ)     Comment:  left breast No date: Diabetes mellitus without complication (HCC) No date: GERD (gastroesophageal reflux disease) No date: History of kidney stones No date: Hypothyroidism No date: Other and unspecified hyperlipidemia No date: Other chronic pulmonary heart diseases No date: Panic attacks No date: PONV (postoperative nausea and vomiting) No date: Unspecified essential hypertension  Past Surgical  History: No date: ABDOMINAL HYSTERECTOMY 08/19/2016: BREAST BIOPSY; Left     Comment:  DUCTAL CARCINOMA IN SITU (DCIS) INVOLVING A SCLEROSING               LESION No date: CHOLECYSTECTOMY No date: lumpectomy (otheR) No date: MASTECTOMY; Right 09/13/2016: SENTINEL NODE BIOPSY; Left     Comment:  Procedure: SENTINEL NODE BIOPSY;  Surgeon: [name], MD;  Location: [facility] ORS;  Service: General;                Laterality: Left; 09/13/2016: SIMPLE MASTECTOMY WITH AXILLARY SENTINEL NODE BIOPSY; Left     Comment:  Procedure: SIMPLE MASTECTOMY;  Surgeon: [name], MD;  Location: [facility] ORS;  Service: General;                Laterality: Left;  BMI    Body Mass Index: 23.03 kg/m      Reproductive/Obstetrics negative OB ROS                            Anesthesia Physical  Anesthesia Plan  ASA: III  Anesthesia Plan: General   Post-op Pain Management:    Induction:   PONV Risk Score and Plan: 2 and 3 and Ondansetron, Dexamethasone and Midazolam  Airway Management Planned: Oral ETT and Video Laryngoscope Planned  Additional Equipment:   Intra-op Plan:   Post-operative Plan:   Informed Consent: I have reviewed the patients History and Physical, chart, labs and discussed the procedure including the risks, benefits  and alternatives for the proposed anesthesia with the patient or authorized representative who has indicated his/her understanding and acceptance.     Dental Advisory Given  Plan Discussed with: Anesthesiologist, CRNA and Surgeon  Anesthesia Plan Comments:        Anesthesia Quick Evaluation

## 2020-05-05 NOTE — Anesthesia Postprocedure Evaluation (Signed)
Anesthesia Post Note  Patient: Tammy Cannon  Procedure(s) Performed: LAPAROSCOPIC RIGHT COLECTOMY (Right Abdomen)  Patient location during evaluation: PACU Anesthesia Type: General Level of consciousness: awake and alert Pain management: pain level controlled Vital Signs Assessment: post-procedure vital signs reviewed and stable Respiratory status: spontaneous breathing, nonlabored ventilation, respiratory function stable and patient connected to nasal cannula oxygen Cardiovascular status: blood pressure returned to baseline and stable Postop Assessment: no apparent nausea or vomiting Anesthetic complications: no   No complications documented.   Last Vitals:  Vitals:   05/05/20 1800 05/05/20 1833  BP:  (!) 119/58  Pulse:  63  Resp:  18  Temp: 36.9 C 36.6 C  SpO2:  95%    Last Pain:  Vitals:   05/05/20 1833  TempSrc: Oral  PainSc:                  Precious Haws Secily Walthour

## 2020-05-05 NOTE — Anesthesia Procedure Notes (Signed)
Procedure Name: Intubation Date/Time: 05/05/2020 2:13 PM Performed by: Lily Peer, Kallista Pae, CRNA Pre-anesthesia Checklist: Patient identified, Emergency Drugs available, Suction available and Patient being monitored Patient Re-evaluated:Patient Re-evaluated prior to induction Oxygen Delivery Method: Circle system utilized Preoxygenation: Pre-oxygenation with 100% oxygen Induction Type: IV induction Ventilation: Mask ventilation without difficulty Laryngoscope Size: McGraph and 3 Grade View: Grade I Tube type: Oral Tube size: 7.0 mm Number of attempts: 1 Airway Equipment and Method: Stylet Placement Confirmation: ETT inserted through vocal cords under direct vision,  positive ETCO2 and breath sounds checked- equal and bilateral Secured at: 21 cm Tube secured with: Tape Dental Injury: Teeth and Oropharynx as per pre-operative assessment

## 2020-05-05 NOTE — H&P (Signed)
Tammy Cannon [id number] Jan 26, 1941     HPI: Patient identified with large polyp in the ascending colon that could not be endoscopically removed.  Biopsies suggest at least in situ carcinoma.  CT is unremarkable.  For right hemicolectomy.  The patient had significant nausea and vomiting with the bowel prep.  Medications Prior to Admission  Medication Sig Dispense Refill Last Dose  . levothyroxine (SYNTHROID) 100 MCG tablet Take 100 mcg by mouth daily before breakfast.   05/05/2020 at Unknown time  . pantoprazole (PROTONIX) 40 MG tablet Take 40 mg by mouth daily.   05/05/2020 at Unknown time  . polyethylene glycol (MIRALAX) 17 g packet Take 17 g by mouth 2 (two) times daily. Take twice daily until going regularly (Patient taking differently: Take 17 g by mouth daily as needed for moderate constipation.) 30 each 1   . acetaminophen (TYLENOL) 500 MG tablet Take 500 mg by mouth every 6 (six) hours as needed for headache.   04/21/2020  . aspirin EC 81 MG tablet Take 81 mg by mouth daily as needed (heart murmur). Swallow whole.   04/28/2020  . metFORMIN (GLUCOPHAGE) 1000 MG tablet Take 1,000 mg by mouth at bedtime.   05/02/2020  . pravastatin (PRAVACHOL) 40 MG tablet Take 40 mg by mouth at bedtime.   05/02/2020  . trandolapril (MAVIK) 4 MG tablet Take 4 mg by mouth daily.   05/03/2020  . traZODone (DESYREL) 50 MG tablet Take 50 mg by mouth at bedtime.    05/02/2020  . verapamil (VERELAN PM) 240 MG 24 hr capsule Take 240 mg by mouth at bedtime.    05/03/2020   No Known Allergies Past Medical History:  Diagnosis Date  . Anemia   . Breast cancer [facility]) 1989   Right mastectomy /pos node [name] /Dr Cerame  . Cardiomegaly   . Complication of anesthesia    with colonoscopy  . Coronary atherosclerosis of native coronary artery   . COVID-19   . DCIS (ductal carcinoma in situ) 08/19/2016   left breast  . Diabetes mellitus without complication ([place])   . Dyspnea   . GERD (gastroesophageal reflux  disease)   . Heart murmur   . History of kidney stones   . Hypothyroidism   . Other and unspecified hyperlipidemia   . Other chronic pulmonary heart diseases   . Panic attacks   . Pneumonia    covid PNA  . PONV (postoperative nausea and vomiting)   . Unspecified essential hypertension    Past Surgical History:  Procedure Laterality Date  . ABDOMINAL HYSTERECTOMY    . BREAST BIOPSY Left 08/19/2016   DUCTAL CARCINOMA IN SITU (DCIS) INVOLVING A SCLEROSING LESION  . CHOLECYSTECTOMY    . COLONOSCOPY WITH PROPOFOL N/A 04/03/2020   Procedure: COLONOSCOPY WITH PROPOFOL;  Surgeon: Jonathon Bellows, MD;  Location: [facility] ENDOSCOPY;  Service: Gastroenterology;  Laterality: N/A;  . lumpectomy (otheR)    . MASTECTOMY     both  . SENTINEL NODE BIOPSY Left 09/13/2016   Procedure: SENTINEL NODE BIOPSY;  Surgeon: [name], MD;  Location: [facility] ORS;  Service: General;  Laterality: Left;  . SIMPLE MASTECTOMY WITH AXILLARY SENTINEL NODE BIOPSY Left 09/13/2016   Procedure: SIMPLE MASTECTOMY;  Surgeon: [name], MD;  Location: [facility] ORS;  Service: General;  Laterality: Left;   Social History   Socioeconomic History  . Marital status: Widowed    Spouse name: Not on file  . Number of children: Not on file  . Years of  education: Not on file  . Highest education level: Not on file  Occupational History  . Not on file  Tobacco Use  . Smoking status: Never Smoker  . Smokeless tobacco: Never Used  Vaping Use  . Vaping Use: Never used  Substance and Sexual Activity  . Alcohol use: No  . Drug use: No  . Sexual activity: Not on file  Other Topics Concern  . Not on file  Social History Narrative   Full time. Does not regularly exercises       Social Determinants of Health   Financial Resource Strain: Not on file  Food Insecurity: Not on file  Transportation Needs: Not on file  Physical Activity: Not on file  Stress: Not on file  Social Connections: Not on file  Intimate Partner  Violence: Not on file   Social History   Social History Narrative   Full time. Does not regularly exercises         ROS: Negative.     PE: HEENT: Negative. Lungs: Clear. Cardio: RR.   Assessment/Plan:  Proceed with planned right hemi-colectomy.    Forest Gleason [facility] 05/05/2020

## 2020-05-06 ENCOUNTER — Encounter: Payer: Self-pay | Admitting: General Surgery

## 2020-05-06 LAB — CBC
HCT: 34.4 % — ABNORMAL LOW (ref 36.0–46.0)
Hemoglobin: 10.9 g/dL — ABNORMAL LOW (ref 12.0–15.0)
MCH: 27.2 pg (ref 26.0–34.0)
MCHC: 31.7 g/dL (ref 30.0–36.0)
MCV: 85.8 fL (ref 80.0–100.0)
Platelets: 216 10*3/uL (ref 150–400)
RBC: 4.01 MIL/uL (ref 3.87–5.11)
RDW: 14.2 % (ref 11.5–15.5)
WBC: 10.9 10*3/uL — ABNORMAL HIGH (ref 4.0–10.5)
nRBC: 0 % (ref 0.0–0.2)

## 2020-05-06 LAB — BASIC METABOLIC PANEL
Anion gap: 12 (ref 5–15)
BUN: 15 mg/dL (ref 8–23)
CO2: 21 mmol/L — ABNORMAL LOW (ref 22–32)
Calcium: 8.8 mg/dL — ABNORMAL LOW (ref 8.9–10.3)
Chloride: 105 mmol/L (ref 98–111)
Creatinine, Ser: 1.01 mg/dL — ABNORMAL HIGH (ref 0.44–1.00)
GFR, Estimated: 57 mL/min — ABNORMAL LOW (ref >60)
Glucose, Bld: 139 mg/dL — ABNORMAL HIGH (ref 70–99)
Potassium: 3.9 mmol/L (ref 3.5–5.1)
Sodium: 138 mmol/L (ref 135–145)

## 2020-05-06 LAB — HEMOGLOBIN A1C
Hgb A1c MFr Bld: 6.2 % — ABNORMAL HIGH (ref 4.8–5.6)
Mean Plasma Glucose: 131.24 mg/dL

## 2020-05-06 LAB — GLUCOSE, CAPILLARY
Glucose-Capillary: 134 mg/dL — ABNORMAL HIGH (ref 70–99)
Glucose-Capillary: 142 mg/dL — ABNORMAL HIGH (ref 70–99)
Glucose-Capillary: 100 mg/dL — ABNORMAL HIGH (ref 70–99)
Glucose-Capillary: 123 mg/dL — ABNORMAL HIGH (ref 70–99)

## 2020-05-06 MED ORDER — METFORMIN HCL 500 MG PO TABS
500.0000 mg | ORAL_TABLET | Freq: Two times a day (BID) | ORAL | Status: DC
  Administered 2020-05-06 – 2020-05-07 (×2): 500 mg via ORAL
  Filled 2020-05-06 (×2): qty 1

## 2020-05-06 MED ORDER — ACETAMINOPHEN 325 MG PO TABS
650.0000 mg | ORAL_TABLET | ORAL | Status: DC
  Administered 2020-05-06 – 2020-05-07 (×5): 650 mg via ORAL
  Filled 2020-05-06 (×5): qty 2

## 2020-05-06 NOTE — Progress Notes (Addendum)
AVSS. Comfortable.  Lungs: Clear. Needs work with incentive.  Cardio: RR.  ABD: Non-distended, soft.  Extrem: Soft.  LABS: Ok.   Doing well.  Plan: Clear liquids, ambulation, incentive.

## 2020-05-06 NOTE — Plan of Care (Signed)
  Problem: Education: Goal: Knowledge of General Education information will improve Description: Including pain rating scale, medication(s)/side effects and non-pharmacologic comfort measures Outcome: Progressing   Problem: Clinical Measurements: Goal: Ability to maintain clinical measurements within normal limits will improve Outcome: Progressing   Problem: Pain Managment: Goal: General experience of comfort will improve Outcome: Progressing   

## 2020-05-07 ENCOUNTER — Other Ambulatory Visit: Payer: Medicare Other

## 2020-05-07 LAB — BASIC METABOLIC PANEL
Anion gap: 7 (ref 5–15)
BUN: 15 mg/dL (ref 8–23)
CO2: 27 mmol/L (ref 22–32)
Calcium: 8.6 mg/dL — ABNORMAL LOW (ref 8.9–10.3)
Chloride: 108 mmol/L (ref 98–111)
Creatinine, Ser: 0.87 mg/dL (ref 0.44–1.00)
GFR, Estimated: >60 mL/min (ref >60)
Glucose, Bld: 110 mg/dL — ABNORMAL HIGH (ref 70–99)
Potassium: 3.6 mmol/L (ref 3.5–5.1)
Sodium: 142 mmol/L (ref 135–145)

## 2020-05-07 LAB — CBC
HCT: 31.8 % — ABNORMAL LOW (ref 36.0–46.0)
Hemoglobin: 10 g/dL — ABNORMAL LOW (ref 12.0–15.0)
MCH: 27.4 pg (ref 26.0–34.0)
MCHC: 31.4 g/dL (ref 30.0–36.0)
MCV: 87.1 fL (ref 80.0–100.0)
Platelets: 190 10*3/uL (ref 150–400)
RBC: 3.65 MIL/uL — ABNORMAL LOW (ref 3.87–5.11)
RDW: 14.6 % (ref 11.5–15.5)
WBC: 8.6 10*3/uL (ref 4.0–10.5)
nRBC: 0 % (ref 0.0–0.2)

## 2020-05-07 LAB — GLUCOSE, CAPILLARY
Glucose-Capillary: 105 mg/dL — ABNORMAL HIGH (ref 70–99)
Glucose-Capillary: 129 mg/dL — ABNORMAL HIGH (ref 70–99)
Glucose-Capillary: 111 mg/dL — ABNORMAL HIGH (ref 70–99)

## 2020-05-07 MED ORDER — HYDROCODONE-ACETAMINOPHEN 5-325 MG PO TABS: 1.0000 | ORAL_TABLET | ORAL | 0 refills | Status: AC | PRN

## 2020-05-07 NOTE — Progress Notes (Signed)
AVSS. Tolerating diet well. + BM.  Lungs: Clear. Inspirex about 500  Cardio: RR ABD: Soft, non-tender, good BS. Wounds: Small amount of serous drainage at main incision. Extrem: Soft.  BS: Doing well.  Path: Invasive cancer, node negative. PT1N0.  IMP: Doing well.  Plan: Home today.

## 2020-05-08 ENCOUNTER — Other Ambulatory Visit: Payer: Self-pay | Admitting: General Surgery

## 2020-05-08 NOTE — Progress Notes (Signed)
My error its 1 year post op surveillance for Stage 1, Tammy Cannon correct my error- recall at 1 year   Tammy Cannon

## 2020-05-12 NOTE — Discharge Summary (Addendum)
Physician Discharge Summary  Patient ID: Tammy Cannon MRN: [id number] DOB/AGE: Jan 28, 1941 79 y.o.  Admit date: 05/05/2020 Discharge date: 05/07/2020.  Admission Diagnoses:  Discharge Diagnoses:  Active Problems:   Colon cancer [facility])   Discharged Condition: good  Hospital Course: Right hemi-colectomy completed without incident. Rapid return of GI function. Aggressive ambulation.  Diet well tolerated.   Consults: None  Significant Diagnostic Studies: Pathology: 2.1 cm invasive adenocarcinoma, 0/25 nodes positive. Margins clear.   Treatments: IV hydration  Discharge Exam: Blood pressure 123/60, pulse 69, temperature 97.8 F (36.6 C), resp. rate 15, height 5\' 4"  (1.626 m), weight 60.8 kg, SpO2 95 %. General appearance: alert and cooperative Resp: clear to auscultation bilaterally Cardio: regular rate and rhythm, S1, S2 normal, no murmur, click, rub or gallop GI: soft, non-tender; bowel sounds normal; no masses,  no organomegaly Incision/Wound: Small amount of serous drainage at midline wound. No erythema or tenderness.  Disposition: Discharge disposition: 01-Home or Self Care       Discharge Instructions    Care order/instruction   Complete by: As directed    Please put a fresh tegaderm dressing (w/ gauze) over midline wound. Thanks.   Diet - low sodium heart healthy   Complete by: As directed    Discharge instructions   Complete by: As directed    OK to shower. (Have your daughter in the bathroom with you.)  Diet as tolerated.  Avoid large amounts of raw fruits/ vegetables.  No lifting over 10 pounds.  No driving until pain free.  Use your incentive spirometer frequently through the day.  Tylenol/ Advil/ Aleve: If needed for soreness.  Norco (hydrocodone): If needed for pain.   (1/2-1 tablet every 4-6 hours).   Increase activity slowly   Complete by: As directed    No wound care   Complete by: As directed      Allergies as of 05/07/2020   No Known  Allergies     Medication List    STOP taking these medications   polyethylene glycol 17 g packet Commonly known as: MiraLax     TAKE these medications   acetaminophen 500 MG tablet Commonly known as: TYLENOL Take 500 mg by mouth every 6 (six) hours as needed for headache.   aspirin EC 81 MG tablet Take 81 mg by mouth daily as needed (heart murmur). Swallow whole.   HYDROcodone-acetaminophen 5-325 MG tablet Commonly known as: NORCO/VICODIN Take 1 tablet by mouth every 4 (four) hours as needed for moderate pain.   levothyroxine 100 MCG tablet Commonly known as: SYNTHROID Take 100 mcg by mouth daily before breakfast.   metFORMIN 1000 MG tablet Commonly known as: GLUCOPHAGE Take 1,000 mg by mouth at bedtime.   pantoprazole 40 MG tablet Commonly known as: PROTONIX Take 40 mg by mouth daily.   pravastatin 40 MG tablet Commonly known as: PRAVACHOL Take 40 mg by mouth at bedtime.   trandolapril 4 MG tablet Commonly known as: MAVIK Take 4 mg by mouth daily.   traZODone 50 MG tablet Commonly known as: DESYREL Take 50 mg by mouth at bedtime.   verapamil 240 MG 24 hr capsule Commonly known as: VERELAN PM Take 240 mg by mouth at bedtime.        Signed: Forest Gleason Dakotah Orrego 05/12/2020, 7:38 AM

## 2020-05-15 ENCOUNTER — Other Ambulatory Visit: Payer: Medicare Other

## 2020-05-21 ENCOUNTER — Inpatient Hospital Stay: Payer: Medicare Other | Attending: Oncology | Admitting: Hospice and Palliative Medicine

## 2020-05-21 ENCOUNTER — Other Ambulatory Visit: Payer: Self-pay

## 2020-05-21 DIAGNOSIS — C189 Malignant neoplasm of colon, unspecified: Secondary | ICD-10-CM

## 2020-05-21 NOTE — Progress Notes (Signed)
Multidisciplinary Oncology Council Documentation  Tammy Cannon was presented by our [facility] on 05/21/2020, which included representatives from:  . Palliative Care . Dietitian . Physical/Occupational Therapist . Speech Therapist . Nurse Navigator . Genetics    Tammy Cannon currently presents with history of stage I CRC  We reviewed previous medical and familial history, history of present illness, and recent lab results along with all available histopathologic and imaging studies. The MOC considered available treatment options and made the following recommendations/referrals:  Genetics  The MOC is a meeting of clinicians from various specialty areas who evaluate and discuss patients for whom a multidisciplinary approach is being considered. Final determinations in the plan of care are those of the provider(s).   Today's extended care, comprehensive team conference, Tammy Cannon was not present for the discussion and was not examined.

## 2020-05-23 LAB — SURGICAL PATHOLOGY

## 2020-06-19 DIAGNOSIS — K648 Other hemorrhoids: Secondary | ICD-10-CM | POA: Diagnosis not present

## 2020-06-24 ENCOUNTER — Encounter: Payer: Medicare Other | Admitting: Licensed Clinical Social Worker

## 2020-06-24 ENCOUNTER — Other Ambulatory Visit: Payer: Medicare Other

## 2020-07-17 DIAGNOSIS — I10 Essential (primary) hypertension: Secondary | ICD-10-CM | POA: Diagnosis not present

## 2020-07-17 DIAGNOSIS — E039 Hypothyroidism, unspecified: Secondary | ICD-10-CM | POA: Diagnosis not present

## 2020-07-17 DIAGNOSIS — E1169 Type 2 diabetes mellitus with other specified complication: Secondary | ICD-10-CM | POA: Diagnosis not present

## 2020-07-17 DIAGNOSIS — E785 Hyperlipidemia, unspecified: Secondary | ICD-10-CM | POA: Diagnosis not present

## 2020-07-29 DIAGNOSIS — K648 Other hemorrhoids: Secondary | ICD-10-CM | POA: Diagnosis not present

## 2020-08-18 DIAGNOSIS — I10 Essential (primary) hypertension: Secondary | ICD-10-CM | POA: Diagnosis not present

## 2020-08-18 DIAGNOSIS — Z6824 Body mass index (BMI) 24.0-24.9, adult: Secondary | ICD-10-CM | POA: Diagnosis not present

## 2020-08-18 DIAGNOSIS — E039 Hypothyroidism, unspecified: Secondary | ICD-10-CM | POA: Diagnosis not present

## 2020-08-18 DIAGNOSIS — R7989 Other specified abnormal findings of blood chemistry: Secondary | ICD-10-CM | POA: Diagnosis not present

## 2020-09-22 ENCOUNTER — Ambulatory Visit (INDEPENDENT_AMBULATORY_CARE_PROVIDER_SITE_OTHER): Payer: No Typology Code available for payment source | Admitting: Podiatry

## 2020-09-22 ENCOUNTER — Other Ambulatory Visit: Payer: Self-pay

## 2020-09-22 ENCOUNTER — Encounter: Payer: Self-pay | Admitting: Podiatry

## 2020-09-22 DIAGNOSIS — G5793 Unspecified mononeuropathy of bilateral lower limbs: Secondary | ICD-10-CM

## 2020-09-22 MED ORDER — GABAPENTIN 100 MG PO CAPS: 100.0000 mg | ORAL_CAPSULE | Freq: Every day | ORAL | 3 refills | Status: DC

## 2020-09-22 NOTE — Progress Notes (Signed)
She presents today with her daughter with some burning pain and numbness tingling mostly in her legs and feet at nighttime.  Objective: Vital signs stable alert oriented x3.  Pulses are palpable.  Neurologic sensorium is intact per Semmes Weinstein monofilament.  Degenerative flexors are intact muscle strength is normal symmetrical.  Dry skin but no open lesions or wounds.  Assessment: Diabetic peripheral neuropathy.  Plan: At this point I recommended 100 mg of gabapentin at bedtime.  We discussed pros and cons of this medication I will follow-up with her in 1 month to see if we need to adjust the dose.

## 2020-10-20 ENCOUNTER — Encounter: Payer: Self-pay | Admitting: Podiatry

## 2020-10-20 ENCOUNTER — Other Ambulatory Visit: Payer: Self-pay

## 2020-10-20 ENCOUNTER — Ambulatory Visit (INDEPENDENT_AMBULATORY_CARE_PROVIDER_SITE_OTHER): Payer: No Typology Code available for payment source | Admitting: Podiatry

## 2020-10-20 DIAGNOSIS — G5793 Unspecified mononeuropathy of bilateral lower limbs: Secondary | ICD-10-CM

## 2020-10-20 MED ORDER — GABAPENTIN 100 MG PO CAPS: 200.0000 mg | ORAL_CAPSULE | Freq: Every day | ORAL | 5 refills | Status: DC

## 2020-10-21 NOTE — Progress Notes (Signed)
She presents today for follow-up of her gabapentin.  She has been taking it 100 mg.  She states that she seems to be doing much better now.  States that she is at least 50% improved.  Objective: Vital signs are stable is alert oriented x3 no change in physical exam.  Assessment: Neuropathy.  Plan: Increase her gabapentin by 100 mg.  She will take 200 mg at bedtime.  And I will follow-up with her in 1 month for med check.  We did change her prescription at the pharmacy to reflect the change in her dosage.

## 2020-11-04 DIAGNOSIS — C182 Malignant neoplasm of ascending colon: Secondary | ICD-10-CM | POA: Diagnosis not present

## 2020-11-05 DIAGNOSIS — Z9013 Acquired absence of bilateral breasts and nipples: Secondary | ICD-10-CM | POA: Diagnosis not present

## 2020-11-05 DIAGNOSIS — C50112 Malignant neoplasm of central portion of left female breast: Secondary | ICD-10-CM | POA: Diagnosis not present

## 2020-11-05 DIAGNOSIS — C50111 Malignant neoplasm of central portion of right female breast: Secondary | ICD-10-CM | POA: Diagnosis not present

## 2020-11-27 ENCOUNTER — Other Ambulatory Visit: Payer: Self-pay | Admitting: Family Medicine

## 2020-11-27 ENCOUNTER — Other Ambulatory Visit: Payer: Self-pay | Admitting: Nurse Practitioner

## 2020-11-27 ENCOUNTER — Other Ambulatory Visit: Payer: Self-pay

## 2020-11-27 ENCOUNTER — Ambulatory Visit
Admission: RE | Admit: 2020-11-27 | Discharge: 2020-11-27 | Disposition: A | Discharge status: Home / Self Care | Payer: No Typology Code available for payment source | Source: Ambulatory Visit | Attending: Family Medicine | Admitting: Family Medicine

## 2020-11-27 DIAGNOSIS — R600 Localized edema: Secondary | ICD-10-CM | POA: Insufficient documentation

## 2020-11-27 DIAGNOSIS — E1169 Type 2 diabetes mellitus with other specified complication: Secondary | ICD-10-CM | POA: Diagnosis not present

## 2020-11-27 DIAGNOSIS — E039 Hypothyroidism, unspecified: Secondary | ICD-10-CM | POA: Diagnosis not present

## 2020-11-27 DIAGNOSIS — I10 Essential (primary) hypertension: Secondary | ICD-10-CM | POA: Diagnosis not present

## 2020-12-02 ENCOUNTER — Other Ambulatory Visit: Payer: Self-pay | Admitting: Family Medicine

## 2020-12-05 ENCOUNTER — Telehealth: Payer: Self-pay

## 2020-12-05 NOTE — Telephone Encounter (Signed)
Pt called and stated that the gabapentin is working so she cancelled her appointment. She would like to know if she should should continue to take the medication every night at bedtime. Pt is aware that Dr Milinda Pointer is out of the office at this time. Please advise.

## 2020-12-08 NOTE — Telephone Encounter (Signed)
Returned the call to patient and informed of Dr Stephenie Acres recommendations, verbalized understanding.

## 2020-12-10 ENCOUNTER — Ambulatory Visit: Payer: No Typology Code available for payment source | Admitting: Podiatry

## 2020-12-19 ENCOUNTER — Encounter: Payer: Self-pay | Admitting: Emergency Medicine

## 2020-12-19 ENCOUNTER — Ambulatory Visit (INDEPENDENT_AMBULATORY_CARE_PROVIDER_SITE_OTHER): Payer: 59

## 2020-12-19 ENCOUNTER — Ambulatory Visit
Admission: EM | Admit: 2020-12-19 | Discharge: 2020-12-19 | Disposition: A | Discharge status: Home / Self Care | Payer: Medicare Other | Attending: Emergency Medicine | Admitting: Emergency Medicine

## 2020-12-19 ENCOUNTER — Other Ambulatory Visit: Payer: Self-pay

## 2020-12-19 DIAGNOSIS — M79672 Pain in left foot: Secondary | ICD-10-CM

## 2020-12-19 DIAGNOSIS — M19072 Primary osteoarthritis, left ankle and foot: Secondary | ICD-10-CM | POA: Diagnosis not present

## 2020-12-19 HISTORY — DX: Polyp of colon: K63.5

## 2020-12-19 NOTE — ED Triage Notes (Signed)
Patient c/o LFT foot pain x 2-3 weeks.   Patient denies fall or trauma.   Patient endorses having a lower extremity US performed recently.   Patient endorses increased pain with ambulation.   Patient endorses pain on the LFT ankle.   History of Diabetic Neuropathy.

## 2020-12-19 NOTE — ED Provider Notes (Signed)
Chief Complaint   Chief Complaint  Patient presents with   Foot Pain     Subjective, HPI  Tammy Cannon is a 80 y.o. female who presents with left foot pain which has been going on for about the last 2 to 3 weeks.  Patient does not report any trauma or falls precipitating current symptoms.  She does state that she had a left lower extremity ultrasound recently which was normal.  Patient reports increased pain with ambulation and states that sometimes the pain will radiate to the left ankle.  Patient does have a history of diabetic neuropathy.  History obtained from patient.  Patient's problem list, past medical and social history, medications, and allergies were reviewed by me and updated in Epic.    ROS  See HPI.  Objective   Vitals:   12/19/20 1625  BP: (!) 157/84  Pulse: 71  Resp: 16  Temp: 98.2 F (36.8 C)  SpO2: 94%    Vital signs and nursing note reviewed.   General: Appears well-developed and well-nourished. No acute distress.  Head: Normocephalic and atraumatic.   Neck: Normal range of motion, neck is supple.  Cardiovascular: Normal rate. Pulm/Chest: No respiratory distress.  Musculoskeletal: Left foot: Mild TTP noted to lateral aspect of left foot without any TTP to left ankle.  There is no severe swelling or bruising noted to the area.  No laxity of bony structures.  5/5 strength, full sensation, 2+  pulses, < 2 sec cap refill.  Neurological: Alert and oriented to person, place, and time.  Skin: Skin is warm and dry.   Psychiatric: Normal mood, affect, behavior, and thought content.    Data  No results found for any visits on 12/19/20.   Imaging Left foot: On my read, no acute fracture or dislocation.  There is degenerative changes noted about the left foot. Pending final interpretation.   Assessment & Plan  1. Left foot pain  80 y.o. female presents with left foot pain which has been going on for about the last 2 to 3 weeks.  Patient does not report any  trauma or falls precipitating current symptoms.  She does state that she had a left lower extremity ultrasound recently which was normal.  Patient reports increased pain with ambulation and states that sometimes the pain will radiate to the left ankle.  Patient does have a history of diabetic neuropathy.  Chart review completed.  Left foot x-ray reveals: No acute fracture or dislocation.  There is degenerative changes noted about the left foot.  Overread pending, as read by me.  Likely, arthritis would be the cause for her foot pain.  Did explain to the patient that if her symptoms persist longer than 2 to 3 weeks she will likely need to follow-up with her primary care provider as she may need a referral to orthopedics or physical therapy.  Explained at home treatment and care to include ice, Tylenol/ibuprofen and rest.  Explained of strict return/emergency department precautions for severe worsening of symptoms.  Patient verbalized understanding and agreed with plan.  Patient stable upon discharge.  Return as needed.  Plan:   Discharge Instructions      Follow up with PCP to inform of visit and treatment in clinic today as orthopedic or PT referral may be needed if symptoms persist longer than 2-3 weeks.  Increase fluid intake. Apply ice to affected area 3-5 times daily for 15-20 minute intervals. Take all medications as prescribed. May take 650 mg Tylenol  OR 400 mg ibuprofen every 8 hours as needed for pain as long as neither medication is contraindicated to your current health conditions. Return to clinic if symptoms worsen. If you experience shortness of breath, chest pain, dizziness, fainting or severe headache go to the ER.          Serafina Royals, [place] 12/19/20 1727

## 2020-12-19 NOTE — Discharge Instructions (Addendum)
Follow up with PCP to inform of visit and treatment in clinic today as orthopedic or PT referral may be needed if symptoms persist longer than 2-3 weeks.  Increase fluid intake. Apply ice to affected area 3-5 times daily for 15-20 minute intervals. Take all medications as prescribed. May take 650 mg Tylenol  OR 400 mg ibuprofen every 8 hours as needed for pain as long as neither medication is contraindicated to your current health conditions. Return to clinic if symptoms worsen. If you experience shortness of breath, chest pain, dizziness, fainting or severe headache go to the ER.

## 2021-01-26 IMAGING — DX DG CHEST 1V PORT
1 series · 1 of 1 positions shown · non-contrast
Comparison: 06/08/2008 chest radiograph.

CLINICAL DATA: Chest pain

EXAM:
PORTABLE CHEST 1 VIEW

[chest]
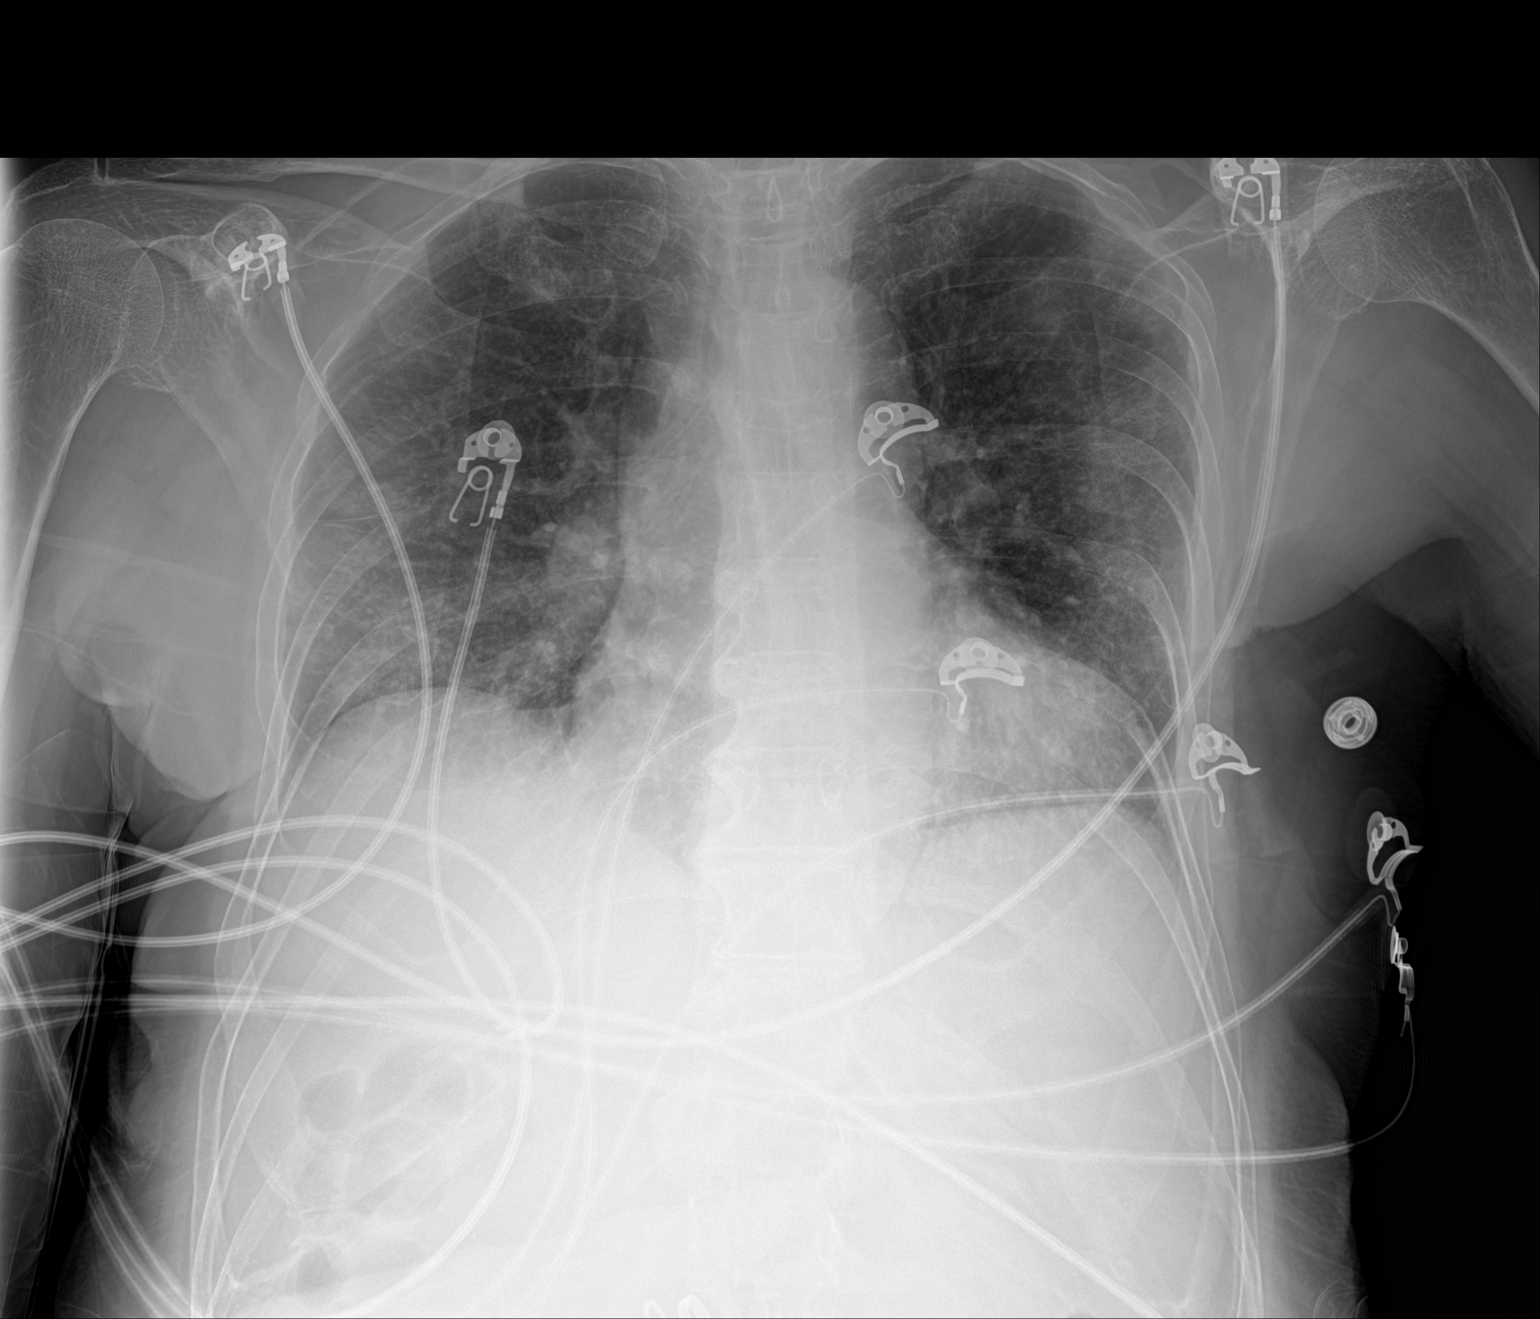

[1 of 1 positions shown; findings below may reference images not displayed]

FINDINGS: Stable cardiomediastinal silhouette with normal heart size. No
pneumothorax. No pleural effusion. Low lung volumes. No pulmonary
edema. Faint hazy opacity in peripheral and basilar lungs
bilaterally.
IMPRESSION: Low lung volumes. Faint hazy opacity in the peripheral and basilar
lungs bilaterally, which could represent atypical/viral pneumonia or
hypoventilatory change. Suggest follow-up PA and lateral chest
radiographs.

## 2021-03-18 ENCOUNTER — Other Ambulatory Visit: Payer: Self-pay | Admitting: General Surgery

## 2021-03-19 ENCOUNTER — Other Ambulatory Visit: Payer: Self-pay | Admitting: General Surgery

## 2021-03-19 NOTE — Progress Notes (Signed)
Subjective:     Patient ID: Tammy Cannon is a 80 y.o. female.   HPI   The following portions of the patient's history were reviewed and updated as appropriate.   This an established patient is here today for: office visit. She is here for follow up rectal bleeding. She states she had one episode of bleeding since last visit, occurred with loose BM's. She has decreased the dose of MiraLAX which has helped.   She is here with Fransico Setters, her daughter.   Review of Systems  Constitutional: Negative for chills and fever.  Respiratory: Negative for cough.          Chief Complaint  Patient presents with   Follow-up      Ht 162.6 cm (5\' 4" )   BMI 22.83 kg/m        Past Medical History:  Diagnosis Date   Breast cancer (CMS-HCC) 08/19/2016    DCIS, simple mastectomy with sentinel node.  DCIS in a complex sclerosing lesion.   Breast cancer (CMS-HCC) 1989    Node positive by report, records not available.  Mastectomy.   Cardiomegaly     Colon cancer (CMS-HCC) 0240   Complication of anesthesia     Coronary atherosclerosis of native coronary artery     COVID-19     Depression     Diabetes (CMS-HCC)     GERD (gastroesophageal reflux disease)     History of kidney stones     HLD (hyperlipidemia)     Hypertension     Hypothyroidism     Mitral valve insufficiency     Other specified pulmonary heart diseases (CMS-HCC)     Panic attacks     PONV (postoperative nausea and vomiting)     Renal lithiasis     Tricuspid valve insufficiency             Past Surgical History:  Procedure Laterality Date   ABDOMINAL HYSTERECTOMY       BREAST EXCISIONAL BIOPSY Left 08/19/2016   CHOLECYSTECTOMY       COLONOSCOPY   05/03/2014    Polyp resected, not retrieved & FH Colon Polyps - repeat 5 years per Dr. Rayann Heman   COLONOSCOPY   04/03/2020   lap right colectomy   05/05/2020   MASTECTOMY SIMPLE Left 09/13/2016   MASTECTOMY SURGERY Right                  OB History     Gravida  1    Para  1   Term      Preterm      AB      Living         SAB      IAB      Ectopic      Molar      Multiple      Live Births           Obstetric Comments  Age at first period 37 Age of first pregnancy 29           Social History          Socioeconomic History   Marital status: Widowed  Tobacco Use   Smoking status: Never Smoker   Smokeless tobacco: Never Used  Substance and Sexual Activity   Alcohol use: No   Drug use: No        No Known Allergies   Current Medications        Current Outpatient Medications  Medication Sig  Dispense Refill   aspirin 81 mg Cap Take by mouth       fluticasone (FLONASE) 50 mcg/actuation nasal spray Place 1 spray into both nostrils 2 (two) times daily 16 g 0   gabapentin (NEURONTIN) 100 MG capsule Take 100 mg by mouth nightly       hydroCHLOROthiazide (MICROZIDE) 12.5 mg capsule Take by mouth once daily          levothyroxine (SYNTHROID, LEVOTHROID) 88 MCG tablet Take 88 mcg by mouth once daily. Take on an empty stomach with a glass of water at least 30-60 minutes before breakfast.       linaclotide 145 mcg Take 1 capsule by mouth as needed          metFORMIN (GLUCOPHAGE) 1000 MG tablet         pantoprazole (PROTONIX) 40 MG DR tablet Take 40 mg by mouth as needed.       polyethylene glycol (MIRALAX) powder One bottle for bowel prep. Use as directed. (Patient taking differently: Take by mouth once daily) 255 g 0   pravastatin (PRAVACHOL) 40 MG tablet Take 40 mg by mouth nightly.       trandolapril (MAVIK) 2 MG tablet Take 2 mg by mouth once daily.       traZODone (DESYREL) 50 MG tablet Take 50 mg by mouth nightly as needed          verapamil (VERELAN) 240 MG SR capsule Take 240 mg by mouth nightly.       codeine-guaifenesin 10-100 mg/5 mL oral liquid Take 5 mLs by mouth every 4 (four) hours as needed (Patient not taking: No sig reported) 120 mL 0   neomycin 500 mg tablet Take two tablets at 6 pm and two tablets at 11 pm the  evening prior to surgery. (Patient not taking: Reported on 11/04/2020) 4 tablet 0    No current facility-administered medications for this visit.             Family History  Problem Relation Age of Onset   Breast cancer Sister     Colon cancer Sister     High blood pressure (Hypertension) Other     Breast cancer Other               Objective:   Physical Exam Exam conducted with a chaperone present.  Constitutional:      Appearance: Normal appearance.  Cardiovascular:     Rate and Rhythm: Normal rate and regular rhythm.     Pulses: Normal pulses.     Heart sounds: Normal heart sounds.  Pulmonary:     Effort: Pulmonary effort is normal.     Breath sounds: Normal breath sounds.  Abdominal:     General: Abdomen is flat.     Comments: Midline wound fully healed.  No residual granulation tissue after treatment prior treatment with silver nitrate.  Musculoskeletal:     Cervical back: Neck supple.  Skin:    General: Skin is warm and dry.  Neurological:     Mental Status: She is alert and oriented to person, place, and time.  Psychiatric:        Mood and Affect: Mood normal.        Behavior: Behavior normal.           Assessment:     Doing well now 6 months status post lap assisted right hemicolectomy for early cancer.    Plan:     The patient has had improvement in her  bowel function with the use of daily MiraLAX.  Adjusting dose based on need.   Candidate for repeat colonoscopy December 2022.  The patient will decide whether she wants to have this procedure completed through [facility] or this office.          This note is partially prepared by Karie Fetch, RN, acting as a scribe in the presence of Dr. [name], MD.  The documentation recorded by the scribe accurately reflects the service I personally performed and the decisions made by me.    [name], MD FACS

## 2021-03-22 ENCOUNTER — Encounter: Payer: Self-pay | Admitting: Emergency Medicine

## 2021-03-22 ENCOUNTER — Ambulatory Visit
Admission: EM | Admit: 2021-03-22 | Discharge: 2021-03-22 | Disposition: A | Discharge status: Home / Self Care | Payer: Medicare Other | Attending: Emergency Medicine | Admitting: Emergency Medicine

## 2021-03-22 ENCOUNTER — Other Ambulatory Visit: Payer: Self-pay

## 2021-03-22 DIAGNOSIS — H6123 Impacted cerumen, bilateral: Secondary | ICD-10-CM

## 2021-03-22 DIAGNOSIS — H9203 Otalgia, bilateral: Secondary | ICD-10-CM | POA: Diagnosis not present

## 2021-03-22 NOTE — ED Triage Notes (Signed)
Pt here with bilateral ear pain and fullness x 1 week.

## 2021-03-22 NOTE — Discharge Instructions (Addendum)
Your earwax was removed by irrigation today.      Take Tylenol as needed for discomfort.  Follow up with your primary care provider if your symptoms are not improving.

## 2021-03-22 NOTE — ED Provider Notes (Signed)
Roderic Palau    CSN: [id number] Arrival date & time: 03/22/21  0948      History   Chief Complaint Chief Complaint  Patient presents with   Otalgia    HPI ELLIETTE SEABOLT is a 80 y.o. female.  Patient presents with 1 week history of bilateral ear pain and fullness.  She denies fever, sore throat, cough, shortness of breath, vomiting, diarrhea, or other symptoms.  No treatments attempted at home.     The history is provided by the patient and medical records.   Past Medical History:  Diagnosis Date   Anemia    Breast cancer ([place]) 1989   Right mastectomy /pos node [name] /Dr Cerame   Cardiomegaly    Colon polyp    Complication of anesthesia    with colonoscopy   Coronary atherosclerosis of native coronary artery    COVID-19    DCIS (ductal carcinoma in situ) 08/19/2016   left breast   Diabetes mellitus without complication (HCC)    Dyspnea    GERD (gastroesophageal reflux disease)    Heart murmur    History of kidney stones    Hypothyroidism    Other and unspecified hyperlipidemia    Other chronic pulmonary heart diseases    Panic attacks    Pneumonia    covid PNA   PONV (postoperative nausea and vomiting)    Unspecified essential hypertension     Patient Active Problem List   Diagnosis Date Noted   Colon cancer ([place]) 05/05/2020   Chronic venous insufficiency 08/25/2017   Ductal carcinoma in situ (DCIS) of left breast 08/26/2016   Radial scar of breast 08/26/2016   HYPERLIPIDEMIA-MIXED 03/05/2009   HYPERTENSION, UNSPECIFIED 03/05/2009   CAD, NATIVE VESSEL 03/05/2009   PULMONARY HYPERTENSION 03/05/2009   VENTRICULAR HYPERTROPHY, LEFT 03/05/2009    Past Surgical History:  Procedure Laterality Date   ABDOMINAL HYSTERECTOMY     BREAST BIOPSY Left 08/19/2016   DUCTAL CARCINOMA IN SITU (DCIS) INVOLVING A SCLEROSING LESION   CHOLECYSTECTOMY     COLONOSCOPY WITH PROPOFOL N/A 04/03/2020   Procedure: COLONOSCOPY WITH PROPOFOL;  Surgeon: Jonathon Bellows,  MD;  Location: [facility] ENDOSCOPY;  Service: Gastroenterology;  Laterality: N/A;   LAPAROSCOPIC RIGHT COLECTOMY Right 05/05/2020   Procedure: LAPAROSCOPIC RIGHT COLECTOMY;  Surgeon: [name], MD;  Location: [facility] ORS;  Service: General;  Laterality: Right;   lumpectomy (otheR)     MASTECTOMY     both   SENTINEL NODE BIOPSY Left 09/13/2016   Procedure: SENTINEL NODE BIOPSY;  Surgeon: [name], MD;  Location: [facility] ORS;  Service: General;  Laterality: Left;   SIMPLE MASTECTOMY WITH AXILLARY SENTINEL NODE BIOPSY Left 09/13/2016   Procedure: SIMPLE MASTECTOMY;  Surgeon: [name], MD;  Location: [facility] ORS;  Service: General;  Laterality: Left;    OB History     Gravida  1   Para  1   Term      Preterm      AB      Living         SAB      IAB      Ectopic      Multiple      Live Births           Obstetric Comments  1st Menstrual Cycle:  13  1st Pregnancy:  20           Home Medications    Prior to Admission medications   Medication Sig Start  Date End Date Taking? Authorizing Provider  acetaminophen (TYLENOL) 500 MG tablet Take 500 mg by mouth every 6 (six) hours as needed for headache.    [provider]  aspirin EC 81 MG tablet Take 81 mg by mouth daily as needed (heart murmur). Swallow whole.    [provider]  gabapentin (NEURONTIN) 100 MG capsule Take 2 capsules (200 mg total) by mouth at bedtime. 10/20/20   Hyatt, Max T, DPM  hydrochlorothiazide (MICROZIDE) 12.5 MG capsule Take by mouth. 07/17/20   [provider]  HYDROcodone-acetaminophen (NORCO/VICODIN) 5-325 MG tablet Take 1 tablet by mouth every 4 (four) hours as needed for moderate pain. 05/07/20 05/07/21  [name], MD  levothyroxine (SYNTHROID) 100 MCG tablet Take 100 mcg by mouth daily before breakfast.    [provider]  levothyroxine (SYNTHROID) 88 MCG tablet Take 88 mcg by mouth daily. 07/17/20   [provider]  linaclotide  Rolan Lipa) 145 MCG CAPS capsule Take by mouth.    [provider]  metFORMIN (GLUCOPHAGE) 1000 MG tablet Take 1,000 mg by mouth at bedtime.    [provider]  pantoprazole (PROTONIX) 40 MG tablet Take 40 mg by mouth daily.    [provider]  pravastatin (PRAVACHOL) 40 MG tablet Take 40 mg by mouth at bedtime.    [provider]  trandolapril (MAVIK) 4 MG tablet Take 4 mg by mouth daily.    [provider]  traZODone (DESYREL) 50 MG tablet Take 50 mg by mouth at bedtime.     [provider]  verapamil (VERELAN PM) 240 MG 24 hr capsule Take 240 mg by mouth at bedtime.     [provider]    Family History Family History  Problem Relation Age of Onset   Breast cancer Sister     Social History Social History   Tobacco Use   Smoking status: Never   Smokeless tobacco: Never  Vaping Use   Vaping Use: Never used  Substance Use Topics   Alcohol use: No   Drug use: No     Allergies   Patient has no known allergies.   Review of Systems Review of Systems  Constitutional:  Negative for chills and fever.  HENT:  Positive for ear pain. Negative for ear discharge and sore throat.   Respiratory:  Negative for cough and shortness of breath.   Cardiovascular:  Negative for chest pain and palpitations.  Gastrointestinal:  Negative for diarrhea and vomiting.  Skin:  Negative for color change and rash.  All other systems reviewed and are negative.   Physical Exam Triage Vital Signs ED Triage Vitals  Enc Vitals Group     BP      Pulse      Resp      Temp      Temp src      SpO2      Weight      Height      Head Circumference      Peak Flow      Pain Score      Pain Loc      Pain Edu?      Excl. in [place]?    No data found.  Updated Vital Signs BP (!) 156/84   Pulse 60   Temp 97.8 F (36.6 C)   Resp 18   SpO2 96%   Visual Acuity Right Eye Distance:   Left Eye Distance:   Bilateral Distance:    Right  Eye  Near:   Left Eye Near:    Bilateral Near:     Physical Exam Vitals and nursing note reviewed.  Constitutional:      General: She is not in acute distress.    Appearance: She is well-developed. She is not ill-appearing.  HENT:     Head: Normocephalic and atraumatic.     Right Ear: Tympanic membrane normal. There is impacted cerumen.     Left Ear: Tympanic membrane normal. There is impacted cerumen.     Nose: Nose normal.     Mouth/Throat:     Mouth: Mucous membranes are moist.     Pharynx: Oropharynx is clear.  Eyes:     Conjunctiva/sclera: Conjunctivae normal.  Cardiovascular:     Rate and Rhythm: Normal rate and regular rhythm.     Heart sounds: Normal heart sounds.  Pulmonary:     Effort: Pulmonary effort is normal. No respiratory distress.     Breath sounds: Normal breath sounds.  Abdominal:     Palpations: Abdomen is soft.     Tenderness: There is no abdominal tenderness.  Musculoskeletal:     Cervical back: Neck supple.  Skin:    General: Skin is warm and dry.  Neurological:     Mental Status: She is alert.  Psychiatric:        Mood and Affect: Mood normal.        Behavior: Behavior normal.     UC Treatments / Results  Labs (all labs ordered are listed, but only abnormal results are displayed) Labs Reviewed - No data to display  EKG   Radiology No results found.  Procedures Procedures (including critical care time)  Medications Ordered in UC Medications - No data to display  Initial Impression / Assessment and Plan / UC Course  I have reviewed the triage vital signs and the nursing notes.  Pertinent labs & imaging results that were available during my care of the patient were reviewed by me and considered in my medical decision making (see chart for details).  Bilateral impacted cerumen and otalgia.  Cerumen removed via irrigation by RN.  No indication of ear infection.  TMs are clear.  Instructed patient to take Tylenol as needed for discomfort and  to follow-up with her PCP if her symptoms are not improving.  She agrees to plan of care.   Final Clinical Impressions(s) / UC Diagnoses   Final diagnoses:  Bilateral impacted cerumen  Otalgia of both ears     Discharge Instructions      Your earwax was removed by irrigation today.      Take Tylenol as needed for discomfort.  Follow up with your primary care provider if your symptoms are not improving.        ED Prescriptions   None    PDMP not reviewed this encounter.   Sharion Balloon, NP 03/22/21 1140

## 2021-05-09 DIAGNOSIS — E119 Type 2 diabetes mellitus without complications: Secondary | ICD-10-CM | POA: Diagnosis not present

## 2021-05-13 ENCOUNTER — Ambulatory Visit
Admission: RE | Admit: 2021-05-13 | Discharge: 2021-05-13 | Disposition: A | Discharge status: Home / Self Care | Payer: PRIVATE HEALTH INSURANCE | Attending: General Surgery | Admitting: General Surgery

## 2021-05-13 ENCOUNTER — Ambulatory Visit: Payer: PRIVATE HEALTH INSURANCE | Admitting: Anesthesiology

## 2021-05-13 ENCOUNTER — Encounter
Admission: RE | Disposition: A | Discharge status: Home / Self Care | Payer: Self-pay | Source: Home / Self Care | Attending: General Surgery

## 2021-05-13 DIAGNOSIS — E119 Type 2 diabetes mellitus without complications: Secondary | ICD-10-CM | POA: Diagnosis not present

## 2021-05-13 DIAGNOSIS — Z7984 Long term (current) use of oral hypoglycemic drugs: Secondary | ICD-10-CM | POA: Insufficient documentation

## 2021-05-13 DIAGNOSIS — D0512 Intraductal carcinoma in situ of left breast: Secondary | ICD-10-CM | POA: Insufficient documentation

## 2021-05-13 DIAGNOSIS — I119 Hypertensive heart disease without heart failure: Secondary | ICD-10-CM | POA: Diagnosis not present

## 2021-05-13 DIAGNOSIS — Z9011 Acquired absence of right breast and nipple: Secondary | ICD-10-CM | POA: Diagnosis not present

## 2021-05-13 DIAGNOSIS — Z9049 Acquired absence of other specified parts of digestive tract: Secondary | ICD-10-CM | POA: Insufficient documentation

## 2021-05-13 DIAGNOSIS — Z9221 Personal history of antineoplastic chemotherapy: Secondary | ICD-10-CM | POA: Diagnosis not present

## 2021-05-13 DIAGNOSIS — K219 Gastro-esophageal reflux disease without esophagitis: Secondary | ICD-10-CM | POA: Diagnosis not present

## 2021-05-13 DIAGNOSIS — F410 Panic disorder [episodic paroxysmal anxiety] without agoraphobia: Secondary | ICD-10-CM | POA: Diagnosis not present

## 2021-05-13 DIAGNOSIS — Z85038 Personal history of other malignant neoplasm of large intestine: Secondary | ICD-10-CM | POA: Diagnosis present

## 2021-05-13 DIAGNOSIS — Z8616 Personal history of COVID-19: Secondary | ICD-10-CM | POA: Insufficient documentation

## 2021-05-13 DIAGNOSIS — E039 Hypothyroidism, unspecified: Secondary | ICD-10-CM | POA: Insufficient documentation

## 2021-05-13 DIAGNOSIS — D649 Anemia, unspecified: Secondary | ICD-10-CM | POA: Diagnosis not present

## 2021-05-13 DIAGNOSIS — Z853 Personal history of malignant neoplasm of breast: Secondary | ICD-10-CM | POA: Diagnosis not present

## 2021-05-13 DIAGNOSIS — I2510 Atherosclerotic heart disease of native coronary artery without angina pectoris: Secondary | ICD-10-CM | POA: Diagnosis not present

## 2021-05-13 DIAGNOSIS — Z87442 Personal history of urinary calculi: Secondary | ICD-10-CM | POA: Insufficient documentation

## 2021-05-13 DIAGNOSIS — Z08 Encounter for follow-up examination after completed treatment for malignant neoplasm: Secondary | ICD-10-CM | POA: Diagnosis not present

## 2021-05-13 DIAGNOSIS — E7849 Other hyperlipidemia: Secondary | ICD-10-CM | POA: Insufficient documentation

## 2021-05-13 DIAGNOSIS — Z86010 Personal history of colonic polyps: Secondary | ICD-10-CM | POA: Diagnosis not present

## 2021-05-13 DIAGNOSIS — R0600 Dyspnea, unspecified: Secondary | ICD-10-CM | POA: Insufficient documentation

## 2021-05-13 DIAGNOSIS — K635 Polyp of colon: Secondary | ICD-10-CM | POA: Diagnosis not present

## 2021-05-13 HISTORY — PX: COLONOSCOPY WITH PROPOFOL: SHX5780

## 2021-05-13 LAB — GLUCOSE, CAPILLARY: Glucose-Capillary: 140 mg/dL — ABNORMAL HIGH (ref 70–99)

## 2021-05-13 SURGERY — COLONOSCOPY WITH PROPOFOL
Anesthesia: General

## 2021-05-13 MED ORDER — PROPOFOL 500 MG/50ML IV EMUL
INTRAVENOUS | Status: DC | PRN
  Administered 2021-05-13: 140 ug/kg/min via INTRAVENOUS

## 2021-05-13 MED ORDER — PROPOFOL 500 MG/50ML IV EMUL
INTRAVENOUS | Status: AC
  Filled 2021-05-13: qty 50

## 2021-05-13 MED ORDER — PROPOFOL 10 MG/ML IV BOLUS
INTRAVENOUS | Status: DC | PRN
  Administered 2021-05-13: 50 mg via INTRAVENOUS
  Administered 2021-05-13: 10 mg via INTRAVENOUS

## 2021-05-13 MED ORDER — PHENYLEPHRINE HCL (PRESSORS) 10 MG/ML IV SOLN
INTRAVENOUS | Status: AC
  Filled 2021-05-13: qty 1

## 2021-05-13 MED ORDER — SODIUM CHLORIDE 0.9 % IV SOLN
INTRAVENOUS | Status: DC
  Administered 2021-05-13: 07:00:00 20 mL/h via INTRAVENOUS

## 2021-05-13 MED ORDER — PROPOFOL 10 MG/ML IV BOLUS
INTRAVENOUS | Status: AC
  Filled 2021-05-13: qty 20

## 2021-05-13 MED ORDER — LIDOCAINE HCL (CARDIAC) PF 100 MG/5ML IV SOSY
PREFILLED_SYRINGE | INTRAVENOUS | Status: DC | PRN
Start: 2021-05-13 — End: 2021-05-13
  Administered 2021-05-13: 60 mg via INTRAVENOUS

## 2021-05-13 NOTE — H&P (Signed)
Tammy Cannon [id number] 01/10/1941     HPI: 1 year s/p right colectomy for a T1N0 carcinoma. Follow up colonoscopy planned. N & V with prep.   Medications Prior to Admission  Medication Sig Dispense Refill Last Dose   acetaminophen (TYLENOL) 500 MG tablet Take 500 mg by mouth every 6 (six) hours as needed for headache.   Past Week   gabapentin (NEURONTIN) 100 MG capsule Take 2 capsules (200 mg total) by mouth at bedtime. 60 capsule 5 Past Week   hydrochlorothiazide (MICROZIDE) 12.5 MG capsule Take by mouth.   Past Week   levothyroxine (SYNTHROID) 100 MCG tablet Take 100 mcg by mouth daily before breakfast.   Past Week   levothyroxine (SYNTHROID) 88 MCG tablet Take 88 mcg by mouth daily.   Past Week   linaclotide (LINZESS) 145 MCG CAPS capsule Take by mouth.   Past Week   metFORMIN (GLUCOPHAGE) 1000 MG tablet Take 1,000 mg by mouth at bedtime.   Past Week   pantoprazole (PROTONIX) 40 MG tablet Take 40 mg by mouth daily.   Past Week   pravastatin (PRAVACHOL) 40 MG tablet Take 40 mg by mouth at bedtime.   Past Week   trandolapril (MAVIK) 4 MG tablet Take 4 mg by mouth daily.   Past Week   traZODone (DESYREL) 50 MG tablet Take 50 mg by mouth at bedtime.    Past Week   verapamil (VERELAN PM) 240 MG 24 hr capsule Take 240 mg by mouth at bedtime.    Past Week   aspirin EC 81 MG tablet Take 81 mg by mouth daily as needed (heart murmur). Swallow whole.      No Known Allergies Past Medical History:  Diagnosis Date   Anemia    Breast cancer ([place]) 1989   Right mastectomy /pos node [name] /Dr Cerame   Cardiomegaly    Colon polyp    Complication of anesthesia    with colonoscopy   Coronary atherosclerosis of native coronary artery    COVID-19    DCIS (ductal carcinoma in situ) 08/19/2016   left breast   Diabetes mellitus without complication (HCC)    Dyspnea    GERD (gastroesophageal reflux disease)    Heart murmur    History of kidney stones    Hypothyroidism    Other and unspecified  hyperlipidemia    Other chronic pulmonary heart diseases    Panic attacks    Pneumonia    covid PNA   PONV (postoperative nausea and vomiting)    Unspecified essential hypertension    Past Surgical History:  Procedure Laterality Date   ABDOMINAL HYSTERECTOMY     BREAST BIOPSY Left 08/19/2016   DUCTAL CARCINOMA IN SITU (DCIS) INVOLVING A SCLEROSING LESION   CHOLECYSTECTOMY     COLONOSCOPY WITH PROPOFOL N/A 04/03/2020   Procedure: COLONOSCOPY WITH PROPOFOL;  Surgeon: Jonathon Bellows, MD;  Location: [facility] ENDOSCOPY;  Service: Gastroenterology;  Laterality: N/A;   LAPAROSCOPIC RIGHT COLECTOMY Right 05/05/2020   Procedure: LAPAROSCOPIC RIGHT COLECTOMY;  Surgeon: [name], MD;  Location: [facility] ORS;  Service: General;  Laterality: Right;   lumpectomy (otheR)     MASTECTOMY     both   SENTINEL NODE BIOPSY Left 09/13/2016   Procedure: SENTINEL NODE BIOPSY;  Surgeon: [name], MD;  Location: [facility] ORS;  Service: General;  Laterality: Left;   SIMPLE MASTECTOMY WITH AXILLARY SENTINEL NODE BIOPSY Left 09/13/2016   Procedure: SIMPLE MASTECTOMY;  Surgeon: [name], MD;  Location: [facility] ORS;  Service: General;  Laterality: Left;   Social History   Socioeconomic History   Marital status: Widowed    Spouse name: Not on file   Number of children: Not on file   Years of education: Not on file   Highest education level: Not on file  Occupational History   Not on file  Tobacco Use   Smoking status: Never   Smokeless tobacco: Never  Vaping Use   Vaping Use: Never used  Substance and Sexual Activity   Alcohol use: No   Drug use: No   Sexual activity: Not on file  Other Topics Concern   Not on file  Social History Narrative   Full time. Does not regularly exercises       Social Determinants of Health   Financial Resource Strain: Not on file  Food Insecurity: Not on file  Transportation Needs: Not on file  Physical Activity: Not on file  Stress: Not on file  Social  Connections: Not on file  Intimate Partner Violence: Not on file   Social History   Social History Narrative   Full time. Does not regularly exercises         ROS: Negative.     PE: HEENT: Negative. Lungs: Clear. Cardio: RR.  Assessment/Plan:  Proceed with planned endoscopy.   Forest Gleason [facility] 05/13/2021

## 2021-05-13 NOTE — Anesthesia Procedure Notes (Signed)
Date/Time: 05/13/2021 7:41 AM Performed by: Lily Peer, Wofford Stratton, CRNA Pre-anesthesia Checklist: Patient identified, Emergency Drugs available, Suction available, Patient being monitored and Timeout performed Patient Re-evaluated:Patient Re-evaluated prior to induction Oxygen Delivery Method: Nasal cannula Induction Type: IV induction

## 2021-05-13 NOTE — Anesthesia Postprocedure Evaluation (Signed)
Anesthesia Post Note  Patient: Tammy Cannon  Procedure(s) Performed: COLONOSCOPY WITH PROPOFOL  Patient location during evaluation: Phase II Anesthesia Type: General Level of consciousness: awake and alert, awake and oriented Pain management: pain level controlled Vital Signs Assessment: post-procedure vital signs reviewed and stable Respiratory status: spontaneous breathing, nonlabored ventilation and respiratory function stable Cardiovascular status: blood pressure returned to baseline and stable Postop Assessment: no apparent nausea or vomiting Anesthetic complications: no   No notable events documented.   Last Vitals:  Vitals:   05/13/21 0805 05/13/21 0815  BP: 127/60 137/75  Pulse: (!) 58 61  Resp: 13 (!) 21  Temp:    SpO2: 99% 100%    Last Pain:  Vitals:   05/13/21 0815  TempSrc:   PainSc: 0-No pain                 Phill Mutter

## 2021-05-13 NOTE — Transfer of Care (Signed)
Immediate Anesthesia Transfer of Care Note  Patient: Tammy Cannon  Procedure(s) Performed: COLONOSCOPY WITH PROPOFOL  Patient Location: Endoscopy Unit  Anesthesia Type:General  Level of Consciousness: drowsy  Airway & Oxygen Therapy: Patient Spontanous Breathing  Post-op Assessment: Report given to RN and Post -op Vital signs reviewed and stable  Post vital signs: Reviewed and stable  Last Vitals:  Vitals Value Taken Time  BP 120/58 05/13/21 0756  Temp 35.9 C 05/13/21 0755  Pulse 63 05/13/21 0758  Resp 14 05/13/21 0758  SpO2 99 % 05/13/21 0758  Vitals shown include unvalidated device data.  Last Pain:  Vitals:   05/13/21 0755  TempSrc: Temporal  PainSc:          Complications: No notable events documented.

## 2021-05-13 NOTE — Op Note (Signed)
[facility] Gastroenterology Patient Name: Tammy Cannon Procedure Date: 05/13/2021 7:35 AM MRN: [id number] Account #: [id number] Date of Birth: 02-Nov-1940 Admit Type: Outpatient Age: 80 Room: [facility] ENDO ROOM 1 Gender: Female Note Status: Finalized Instrument Name: Peds Colonoscope [id number] Procedure:             Colonoscopy Indications:           High risk colon cancer surveillance: Personal history                         of colon cancer Providers:             [name], MD Medicines:             Propofol per Anesthesia Complications:         No immediate complications. Procedure:             Pre-Anesthesia Assessment:                        - Prior to the procedure, a History and Physical was                         performed, and patient medications, allergies and                         sensitivities were reviewed. The patient's tolerance                         of previous anesthesia was reviewed.                        - The risks and benefits of the procedure and the                         sedation options and risks were discussed with the                         patient. All questions were answered and informed                         consent was obtained.                        After obtaining informed consent, the colonoscope was                         passed under direct vision. Throughout the procedure,                         the patient's blood pressure, pulse, and oxygen                         saturations were monitored continuously. The                         Colonoscope was introduced through the anus and                         advanced to the the ileocolonic anastomosis. The  quality of the bowel preparation was good. Findings:      The entire examined colon appeared normal on direct and retroflexion       views. Impression:            - The entire examined colon is normal on direct and                          retroflexion views.                        - No specimens collected. Recommendation:        - Repeat colonoscopy in 3 years for surveillance. Procedure Code(s):     --- Professional ---                        [phone], Colonoscopy, flexible; diagnostic, including                         collection of specimen(s) by brushing or washing, when                         performed (separate procedure) Diagnosis Code(s):     --- Professional ---                        Q22.979, Personal history of other malignant neoplasm                         of large intestine CPT copyright 2019 [employer] Association. All rights reserved. The codes documented in this report are preliminary and upon coder review may  be revised to meet current compliance requirements. [name], MD 05/13/2021 7:55:05 AM This report has been signed electronically. Number of Addenda: 0 Note Initiated On: 05/13/2021 7:35 AM Scope Withdrawal Time: 0 hours 5 minutes 28 seconds  Total Procedure Duration: 0 hours 13 minutes 0 seconds  Estimated Blood Loss:  Estimated blood loss: none.      [facility]

## 2021-05-13 NOTE — Anesthesia Preprocedure Evaluation (Signed)
Anesthesia Evaluation  Patient identified by MRN, date of birth, ID band Patient awake    Reviewed: Allergy & Precautions, H&P , NPO status , Patient's Chart, lab work & pertinent test results  History of Anesthesia Complications (+) PONVNegative for: history of anesthetic complications  Airway Mallampati: II  TM Distance: <3 FB     Dental no notable dental hx.    Pulmonary shortness of breath and with exertion, pneumonia, resolved,    Pulmonary exam normal        Cardiovascular Exercise Tolerance: Good hypertension, Pt. on medications (-) angina+ CAD  (-) Past MI and (-) Cardiac Stents Normal cardiovascular exam(-) dysrhythmias + Valvular Problems/Murmurs  Rhythm:regular Rate:Normal     Neuro/Psych PSYCHIATRIC DISORDERS Anxiety negative neurological ROS     GI/Hepatic Neg liver ROS, Bowel prep,GERD  Controlled and Medicated,  Endo/Other  diabetes, Well Controlled, Type 2, Oral Hypoglycemic AgentsHypothyroidism   Renal/GU negative Renal ROS  negative genitourinary   Musculoskeletal   Abdominal Normal abdominal exam  (+)   Peds  Hematology negative hematology ROS (+) anemia ,   Anesthesia Other Findings Anemia    Breast cancer ([place]) 1989 Right mastectomy /pos node [name] /Dr Cerame  Cardiomegaly    Colon polyp    Complication of anesthesia  with colonoscopy  Coronary atherosclerosis of native coronary artery    COVID-19    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  Diabetes mellitus without complication (HCC)    Dyspnea    GERD (gastroesophageal reflux disease) Heart murmur    History of kidney stones   Hypothyroidism    Other and unspecified hyperlipidemia Other chronic pulmonary heart diseases Panic attacks    Pneumonia  covid PNA  PONV (postoperative nausea and vomiting)    Unspecified essential hypertension       Reproductive/Obstetrics negative OB ROS                              Anesthesia Physical  Anesthesia Plan  ASA: 3  Anesthesia Plan: General   Post-op Pain Management:    Induction: Intravenous  PONV Risk Score and Plan: 3 and TIVA and Propofol infusion  Airway Management Planned: Natural Airway and Nasal Cannula  Additional Equipment:   Intra-op Plan:   Post-operative Plan:   Informed Consent: I have reviewed the patients History and Physical, chart, labs and discussed the procedure including the risks, benefits and alternatives for the proposed anesthesia with the patient or authorized representative who has indicated his/her understanding and acceptance.     Dental Advisory Given  Plan Discussed with: Anesthesiologist, CRNA and Surgeon  Anesthesia Plan Comments:         Anesthesia Quick Evaluation

## 2021-05-14 ENCOUNTER — Encounter: Payer: Self-pay | Admitting: General Surgery

## 2021-05-16 DIAGNOSIS — E119 Type 2 diabetes mellitus without complications: Secondary | ICD-10-CM | POA: Diagnosis not present

## 2021-05-27 DIAGNOSIS — Z20822 Contact with and (suspected) exposure to covid-19: Secondary | ICD-10-CM | POA: Diagnosis not present

## 2021-06-16 DIAGNOSIS — E119 Type 2 diabetes mellitus without complications: Secondary | ICD-10-CM | POA: Diagnosis not present

## 2021-07-14 DIAGNOSIS — E119 Type 2 diabetes mellitus without complications: Secondary | ICD-10-CM | POA: Diagnosis not present

## 2021-07-22 DIAGNOSIS — I10 Essential (primary) hypertension: Secondary | ICD-10-CM | POA: Diagnosis not present

## 2021-07-22 DIAGNOSIS — E118 Type 2 diabetes mellitus with unspecified complications: Secondary | ICD-10-CM | POA: Diagnosis not present

## 2021-07-22 DIAGNOSIS — E785 Hyperlipidemia, unspecified: Secondary | ICD-10-CM | POA: Diagnosis not present

## 2021-07-22 DIAGNOSIS — E1129 Type 2 diabetes mellitus with other diabetic kidney complication: Secondary | ICD-10-CM | POA: Diagnosis not present

## 2021-07-22 DIAGNOSIS — M810 Age-related osteoporosis without current pathological fracture: Secondary | ICD-10-CM | POA: Diagnosis not present

## 2021-07-22 DIAGNOSIS — E039 Hypothyroidism, unspecified: Secondary | ICD-10-CM | POA: Diagnosis not present

## 2021-07-22 DIAGNOSIS — Z1389 Encounter for screening for other disorder: Secondary | ICD-10-CM | POA: Diagnosis not present

## 2021-09-13 DIAGNOSIS — E119 Type 2 diabetes mellitus without complications: Secondary | ICD-10-CM | POA: Diagnosis not present

## 2021-10-14 DIAGNOSIS — E119 Type 2 diabetes mellitus without complications: Secondary | ICD-10-CM | POA: Diagnosis not present

## 2021-11-13 DIAGNOSIS — E119 Type 2 diabetes mellitus without complications: Secondary | ICD-10-CM | POA: Diagnosis not present

## 2021-12-04 DIAGNOSIS — E785 Hyperlipidemia, unspecified: Secondary | ICD-10-CM | POA: Diagnosis not present

## 2021-12-04 DIAGNOSIS — E039 Hypothyroidism, unspecified: Secondary | ICD-10-CM | POA: Diagnosis not present

## 2021-12-04 DIAGNOSIS — E118 Type 2 diabetes mellitus with unspecified complications: Secondary | ICD-10-CM | POA: Diagnosis not present

## 2021-12-04 DIAGNOSIS — I10 Essential (primary) hypertension: Secondary | ICD-10-CM | POA: Diagnosis not present

## 2021-12-04 DIAGNOSIS — R600 Localized edema: Secondary | ICD-10-CM | POA: Diagnosis not present

## 2021-12-14 DIAGNOSIS — E119 Type 2 diabetes mellitus without complications: Secondary | ICD-10-CM | POA: Diagnosis not present

## 2021-12-18 DIAGNOSIS — H6993 Unspecified Eustachian tube disorder, bilateral: Secondary | ICD-10-CM | POA: Diagnosis not present

## 2021-12-18 DIAGNOSIS — E118 Type 2 diabetes mellitus with unspecified complications: Secondary | ICD-10-CM | POA: Diagnosis not present

## 2021-12-18 DIAGNOSIS — R42 Dizziness and giddiness: Secondary | ICD-10-CM | POA: Diagnosis not present

## 2022-01-14 DIAGNOSIS — E119 Type 2 diabetes mellitus without complications: Secondary | ICD-10-CM | POA: Diagnosis not present

## 2022-01-21 DIAGNOSIS — E118 Type 2 diabetes mellitus with unspecified complications: Secondary | ICD-10-CM | POA: Diagnosis not present

## 2022-01-21 DIAGNOSIS — E785 Hyperlipidemia, unspecified: Secondary | ICD-10-CM | POA: Diagnosis not present

## 2022-01-21 DIAGNOSIS — E039 Hypothyroidism, unspecified: Secondary | ICD-10-CM | POA: Diagnosis not present

## 2022-01-21 DIAGNOSIS — I10 Essential (primary) hypertension: Secondary | ICD-10-CM | POA: Diagnosis not present

## 2022-02-13 DIAGNOSIS — E119 Type 2 diabetes mellitus without complications: Secondary | ICD-10-CM | POA: Diagnosis not present

## 2022-03-15 DIAGNOSIS — H524 Presbyopia: Secondary | ICD-10-CM | POA: Diagnosis not present

## 2022-03-16 DIAGNOSIS — H903 Sensorineural hearing loss, bilateral: Secondary | ICD-10-CM | POA: Diagnosis not present

## 2022-03-16 DIAGNOSIS — E119 Type 2 diabetes mellitus without complications: Secondary | ICD-10-CM | POA: Diagnosis not present

## 2022-03-31 ENCOUNTER — Ambulatory Visit (INDEPENDENT_AMBULATORY_CARE_PROVIDER_SITE_OTHER): Payer: BC Managed Care – PPO | Admitting: Dermatology

## 2022-03-31 DIAGNOSIS — L821 Other seborrheic keratosis: Secondary | ICD-10-CM

## 2022-03-31 DIAGNOSIS — L578 Other skin changes due to chronic exposure to nonionizing radiation: Secondary | ICD-10-CM | POA: Diagnosis not present

## 2022-03-31 DIAGNOSIS — L820 Inflamed seborrheic keratosis: Secondary | ICD-10-CM | POA: Diagnosis not present

## 2022-03-31 NOTE — Patient Instructions (Addendum)
Seborrheic Keratosis  What causes seborrheic keratoses? Seborrheic keratoses are harmless, common skin growths that first appear during adult life.  As time goes by, more growths appear.  Some people may develop a large number of them.  Seborrheic keratoses appear on both covered and uncovered body parts.  They are not caused by sunlight.  The tendency to develop seborrheic keratoses can be inherited.  They vary in color from skin-colored to gray, brown, or even black.  They can be either smooth or have a rough, warty surface.   Seborrheic keratoses are superficial and look as if they were stuck on the skin.  Under the microscope this type of keratosis looks like layers upon layers of skin.  That is why at times the top layer may seem to fall off, but the rest of the growth remains and re-grows.    Treatment Seborrheic keratoses do not need to be treated, but can easily be removed in the office.  Seborrheic keratoses often cause symptoms when they rub on clothing or jewelry.  Lesions can be in the way of shaving.  If they become inflamed, they can cause itching, soreness, or burning.  Removal of a seborrheic keratosis can be accomplished by freezing, burning, or surgery. If any spot bleeds, scabs, or grows rapidly, please return to have it checked, as these can be an indication of a skin cancer.  Cryotherapy Aftercare  Wash gently with soap and water everyday.   Apply Vaseline and Band-Aid daily until healed.    Due to recent changes in healthcare laws, you may see results of your pathology and/or laboratory studies on MyChart before the doctors have had a chance to review them. We understand that in some cases there may be results that are confusing or concerning to you. Please understand that not all results are received at the same time and often the doctors may need to interpret multiple results in order to provide you with the best plan of care or course of treatment. Therefore, we ask that you  please give us 2 business days to thoroughly review all your results before contacting the office for clarification. Should we see a critical lab result, you will be contacted sooner.   If You Need Anything After Your Visit  If you have any questions or concerns for your doctor, please call our main line at [phone] and press option 4 to reach your doctor's medical assistant. If no one answers, please leave a voicemail as directed and we will return your call as soon as possible. Messages left after 4 pm will be answered the following business day.   You may also send us a message via MyChart. We typically respond to MyChart messages within 1-2 business days.  For prescription refills, please ask your pharmacy to contact our office. Our fax number is [phone].  If you have an urgent issue when the clinic is closed that cannot wait until the next business day, you can page your doctor at the number below.    Please note that while we do our best to be available for urgent issues outside of office hours, we are not available 24/7.   If you have an urgent issue and are unable to reach us, you may choose to seek medical care at your doctor's office, retail clinic, urgent care center, or emergency room.  If you have a medical emergency, please immediately call 911 or go to the emergency department.  Pager Numbers  - Dr. Kowalski: [phone]  -   Dr. Moye: [phone]  - Dr. Stewart: [phone]  In the event of inclement weather, please call our main line at [phone] for an update on the status of any delays or closures.  Dermatology Medication Tips: Please keep the boxes that topical medications come in in order to help keep track of the instructions about where and how to use these. Pharmacies typically print the medication instructions only on the boxes and not directly on the medication tubes.   If your medication is too expensive, please contact our office at  [phone] option 4 or send us a message through MyChart.   We are unable to tell what your co-pay for medications will be in advance as this is different depending on your insurance coverage. However, we may be able to find a substitute medication at lower cost or fill out paperwork to get insurance to cover a needed medication.   If a prior authorization is required to get your medication covered by your insurance company, please allow us 1-2 business days to complete this process.  Drug prices often vary depending on where the prescription is filled and some pharmacies may offer cheaper prices.  The website www.goodrx.com contains coupons for medications through different pharmacies. The prices here do not account for what the cost may be with help from insurance (it may be cheaper with your insurance), but the website can give you the price if you did not use any insurance.  - You can print the associated coupon and take it with your prescription to the pharmacy.  - You may also stop by our office during regular business hours and pick up a GoodRx coupon card.  - If you need your prescription sent electronically to a different pharmacy, notify our office through [place] MyChart or by phone at [phone] option 4.     Si Usted Necesita Algo Despus de Su Visita  Tambin puede enviarnos un mensaje a travs de MyChart. Por lo general respondemos a los mensajes de MyChart en el transcurso de 1 a 2 das hbiles.  Para renovar recetas, por favor pida a su farmacia que se ponga en contacto con nuestra oficina. Nuestro nmero de fax es el [phone].  Si tiene un asunto urgente cuando la clnica est cerrada y que no puede esperar hasta el siguiente da hbil, puede llamar/localizar a su doctor(a) al nmero que aparece a continuacin.   Por favor, tenga en cuenta que aunque hacemos todo lo posible para estar disponibles para asuntos urgentes fuera del horario de oficina, no estamos  disponibles las 24 horas del da, los 7 das de la semana.   Si tiene un problema urgente y no puede comunicarse con nosotros, puede optar por buscar atencin mdica  en el consultorio de su doctor(a), en una clnica privada, en un centro de atencin urgente o en una sala de emergencias.  Si tiene una emergencia mdica, por favor llame inmediatamente al 911 o vaya a la sala de emergencias.  Nmeros de bper  - Dr. Kowalski: [phone]  - Dra. Moye: [phone]  - Dra. Stewart: [phone]  En caso de inclemencias del tiempo, por favor llame a nuestra lnea principal al [phone] para una actualizacin sobre el estado de cualquier retraso o cierre.  Consejos para la medicacin en dermatologa: Por favor, guarde las cajas en las que vienen los medicamentos de uso tpico para ayudarle a seguir las instrucciones sobre dnde y cmo usarlos. Las farmacias generalmente imprimen las instrucciones del medicamento slo en las cajas y   no directamente en los tubos del medicamento.   Si su medicamento es muy caro, por favor, pngase en contacto con nuestra oficina llamando al [phone] y presione la opcin 4 o envenos un mensaje a travs de MyChart.   No podemos decirle cul ser su copago por los medicamentos por adelantado ya que esto es diferente dependiendo de la cobertura de su seguro. Sin embargo, es posible que podamos encontrar un medicamento sustituto a menor costo o llenar un formulario para que el seguro cubra el medicamento que se considera necesario.   Si se requiere una autorizacin previa para que su compaa de seguros cubra su medicamento, por favor permtanos de 1 a 2 das hbiles para completar este proceso.  Los precios de los medicamentos varan con frecuencia dependiendo del lugar de dnde se surte la receta y alguna farmacias pueden ofrecer precios ms baratos.  El sitio web www.goodrx.com tiene cupones para medicamentos de diferentes farmacias. Los precios aqu no  tienen en cuenta lo que podra costar con la ayuda del seguro (puede ser ms barato con su seguro), pero el sitio web puede darle el precio si no utiliz ningn seguro.  - Puede imprimir el cupn correspondiente y llevarlo con su receta a la farmacia.  - Tambin puede pasar por nuestra oficina durante el horario de atencin regular y recoger una tarjeta de cupones de GoodRx.  - Si necesita que su receta se enve electrnicamente a una farmacia diferente, informe a nuestra oficina a travs de MyChart de [place] o por telfono llamando al [phone] y presione la opcin 4.  

## 2022-03-31 NOTE — Progress Notes (Signed)
   New Patient Visit  Subjective  Tammy Cannon is a 81 y.o. female who presents for the following: Skin Problem (The patient lesions on her face to be evaluated, some may be new or changing. ). The patient has spots, moles and lesions to be evaluated, some may be new or changing and the patient has concerns that these could be cancer.  The following portions of the chart were reviewed this encounter and updated as appropriate:   Tobacco  Allergies  Meds  Problems  Med Hx  Surg Hx  Fam Hx     Review of Systems:  No other skin or systemic complaints except as noted in HPI or Assessment and Plan.  Objective  Well appearing patient in no apparent distress; mood and affect are within normal limits.  A focused examination was performed including face. Relevant physical exam findings are noted in the Assessment and Plan.  left cheek x 1 Solitary, smooth skin colored to translucent papule. Stuck-on, waxy, tan-brown papule --Discussed benign etiology and prognosis.    Assessment & Plan  Inflamed seborrheic keratosis left cheek x 1  Symptomatic, irritating, patient would like treated.   Destruction of lesion - left cheek x 1 Complexity: simple   Destruction method: cryotherapy   Informed consent: discussed and consent obtained   Timeout:  patient name, date of birth, surgical site, and procedure verified Lesion destroyed using liquid nitrogen: Yes   Region frozen until ice ball extended beyond lesion: Yes   Outcome: patient tolerated procedure well with no complications   Post-procedure details: wound care instructions given    Actinic Damage - chronic, secondary to cumulative UV radiation exposure/sun exposure over time - diffuse scaly erythematous macules with underlying dyspigmentation - Recommend daily broad spectrum sunscreen SPF 30+ to sun-exposed areas, reapply every 2 hours as needed.  - Recommend staying in the shade or wearing long sleeves, sun glasses (UVA+UVB  protection) and wide brim hats (4-inch brim around the entire circumference of the hat). - Call for new or changing lesions.   Seborrheic Keratoses - Stuck-on, waxy, tan-brown papules and/or plaques  - Benign-appearing - Discussed benign etiology and prognosis. - Observe - Call for any changes   Return if symptoms worsen or fail to improve.  IMarye Round, CMA, am acting as scribe for Sarina Ser, MD .  Documentation: I have reviewed the above documentation for accuracy and completeness, and I agree with the above.  Sarina Ser, MD

## 2022-04-14 ENCOUNTER — Encounter: Payer: Self-pay | Admitting: Dermatology

## 2022-04-15 DIAGNOSIS — E119 Type 2 diabetes mellitus without complications: Secondary | ICD-10-CM | POA: Diagnosis not present

## 2022-04-30 DIAGNOSIS — I10 Essential (primary) hypertension: Secondary | ICD-10-CM | POA: Diagnosis not present

## 2022-04-30 DIAGNOSIS — E118 Type 2 diabetes mellitus with unspecified complications: Secondary | ICD-10-CM | POA: Diagnosis not present

## 2022-05-16 DIAGNOSIS — E119 Type 2 diabetes mellitus without complications: Secondary | ICD-10-CM | POA: Diagnosis not present

## 2023-02-16 ENCOUNTER — Other Ambulatory Visit: Payer: Self-pay

## 2023-02-16 ENCOUNTER — Emergency Department: Payer: BC Managed Care – PPO

## 2023-02-16 ENCOUNTER — Emergency Department
Admission: EM | Admit: 2023-02-16 | Discharge: 2023-02-17 | Disposition: A | Discharge status: Home / Self Care | Payer: BC Managed Care – PPO | Attending: Emergency Medicine | Admitting: Emergency Medicine

## 2023-02-16 DIAGNOSIS — R55 Syncope and collapse: Secondary | ICD-10-CM | POA: Insufficient documentation

## 2023-02-16 DIAGNOSIS — R42 Dizziness and giddiness: Secondary | ICD-10-CM | POA: Diagnosis not present

## 2023-02-16 DIAGNOSIS — E119 Type 2 diabetes mellitus without complications: Secondary | ICD-10-CM | POA: Diagnosis not present

## 2023-02-16 DIAGNOSIS — R0789 Other chest pain: Secondary | ICD-10-CM | POA: Insufficient documentation

## 2023-02-16 LAB — COMPREHENSIVE METABOLIC PANEL
ALT: 20 U/L (ref 0–44)
AST: 25 U/L (ref 15–41)
Albumin: 4.3 g/dL (ref 3.5–5.0)
Alkaline Phosphatase: 90 U/L (ref 38–126)
Anion gap: 13 (ref 5–15)
BUN: 23 mg/dL (ref 8–23)
CO2: 23 mmol/L (ref 22–32)
Calcium: 9.3 mg/dL (ref 8.9–10.3)
Chloride: 102 mmol/L (ref 98–111)
Creatinine, Ser: 1.16 mg/dL — ABNORMAL HIGH (ref 0.44–1.00)
GFR, Estimated: 47 mL/min — ABNORMAL LOW (ref >60)
Glucose, Bld: 224 mg/dL — ABNORMAL HIGH (ref 70–99)
Potassium: 3.9 mmol/L (ref 3.5–5.1)
Sodium: 138 mmol/L (ref 135–145)
Total Bilirubin: 0.8 mg/dL (ref 0.3–1.2)
Total Protein: 8.3 g/dL — ABNORMAL HIGH (ref 6.5–8.1)

## 2023-02-16 LAB — CBC WITH DIFFERENTIAL/PLATELET
Abs Immature Granulocytes: 0.02 10*3/uL (ref 0.00–0.07)
Basophils Absolute: 0 10*3/uL (ref 0.0–0.1)
Basophils Relative: 1 %
Eosinophils Absolute: 0.1 10*3/uL (ref 0.0–0.5)
Eosinophils Relative: 2 %
HCT: 35 % — ABNORMAL LOW (ref 36.0–46.0)
Hemoglobin: 10.5 g/dL — ABNORMAL LOW (ref 12.0–15.0)
Immature Granulocytes: 0 %
Lymphocytes Relative: 31 %
Lymphs Abs: 1.6 10*3/uL (ref 0.7–4.0)
MCH: 25.1 pg — ABNORMAL LOW (ref 26.0–34.0)
MCHC: 30 g/dL (ref 30.0–36.0)
MCV: 83.7 fL (ref 80.0–100.0)
Monocytes Absolute: 0.4 10*3/uL (ref 0.1–1.0)
Monocytes Relative: 8 %
Neutro Abs: 3.1 10*3/uL (ref 1.7–7.7)
Neutrophils Relative %: 58 %
Platelets: 237 10*3/uL (ref 150–400)
RBC: 4.18 MIL/uL (ref 3.87–5.11)
RDW: 16.1 % — ABNORMAL HIGH (ref 11.5–15.5)
WBC: 5.3 10*3/uL (ref 4.0–10.5)
nRBC: 0 % (ref 0.0–0.2)

## 2023-02-16 LAB — CBG MONITORING, ED: Glucose-Capillary: 217 mg/dL — ABNORMAL HIGH (ref 70–99)

## 2023-02-16 NOTE — ED Notes (Signed)
Per DR. Larinda Buttery, CT head,  no code stroke at this time.

## 2023-02-16 NOTE — ED Triage Notes (Addendum)
Pt reports bending over to pick something up and stumbled, pt denies falling on the ground, states she caught herself on the table. Pt denies injury. Pt reports high BP with headache and "funny feeling in her eyes"  denies weakness or numbness, speech clear. LKW 0600 this morning. Pt c/o neck pain radiating in to right arm

## 2023-02-17 LAB — TROPONIN I (HIGH SENSITIVITY): Troponin I (High Sensitivity): 7 ng/L (ref <18)

## 2023-02-17 NOTE — Discharge Instructions (Addendum)
Please drink plenty of fluids over the next couple days.  Please follow-up with your new primary care doctor as scheduled.  Return to the emergency department for any chest pain, trouble breathing, any further episodes of passing out or feeling like you are going to pass out, or any other symptom personally concerning to yourself.

## 2023-02-17 NOTE — ED Provider Notes (Signed)
[facility] Provider Note    Event Date/Time   First MD Initiated Contact with Patient 02/16/23 2353     (approximate)  History   Chief Complaint: Headache  HPI  Tammy Cannon is a 82 y.o. female with a past medical history of anemia, gastric reflux, diabetes, anxiety, presents to the emergency department for a near syncopal event.  According to the patient she reached down to pick something up states she felt somewhat lightheaded while standing back up but was able to catch herself and did not fall to the ground.  Patient states she felt a pressure sensation in her eyes denies any headache.  Patient denies any weakness in the arm or leg any speech difficulties any numbness no chest pain tonight although the patient states she has intermittently been experiencing some mild chest discomfort over the last several months.    Physical Exam   Triage Vital Signs: ED Triage Vitals  Encounter Vitals Group     BP 02/16/23 2100 (!) 141/68     Systolic BP Percentile --      Diastolic BP Percentile --      Pulse Rate 02/16/23 2100 73     Resp 02/16/23 2100 18     Temp 02/16/23 2100 97.6 F (36.4 C)     Temp Source 02/16/23 2100 Oral     SpO2 02/16/23 2100 96 %     Weight 02/16/23 2101 140 lb (63.5 kg)     Height 02/16/23 2101 5\' 3"  (1.6 m)     Head Circumference --      Peak Flow --      Pain Score 02/16/23 2101 8     Pain Loc --      Pain Education --      Exclude from Growth Chart --     Most recent vital signs: Vitals:   02/16/23 2100 02/17/23 0010  BP: (!) 141/68 (!) 160/60  Pulse: 73 (!) 53  Resp: 18 19  Temp: 97.6 F (36.4 C)   SpO2: 96% 96%    General: Awake, no distress.  CV:  Good peripheral perfusion.  Regular rate and rhythm  Resp:  Normal effort.  Equal breath sounds bilaterally.  Abd:  No distention.  Soft, nontender.  No rebound or guarding. Other:  Equal grip strength bilaterally.  No pronator drift.  5/5 motor in all extremities,  cranial nerves intact.   ED Results / Procedures / Treatments   EKG  EKG viewed and interpreted by myself shows a normal sinus rhythm at 66 bpm with a narrow QRS, normal axis, normal intervals, no concerning ST changes.  RADIOLOGY  CT scan of the head reviewed and interpreted by myself shows no bleed to my evaluation. CT scan read as negative by radiology   MEDICATIONS ORDERED IN ED: Medications - No data to display   IMPRESSION / MDM / ASSESSMENT AND PLAN / ED COURSE  I reviewed the triage vital signs and the nursing notes.  Patient's presentation is most consistent with acute presentation with potential threat to life or bodily function.  Patient presents to the emergency department after near syncopal episode earlier today.  Patient states she bent over and felt somewhat lightheaded upon standing back up was able to catch herself on a table and did not fall or hit her head.  Patient does admit that she felt very anxious after this happened so she came to the emergency department for evaluation.  No chest pain at  any point tonight although states she has intermittently had some chest discomfort over the last month or so.  I have added on a troponin as a precaution.  The remainder of the patient's lab work is largely within normal limits including a normal CBC reassuring chemistry besides slight anion gap elevation at 13.  CT scan of the head is negative.  Patient's troponin has resulted normal.  Has no complaints currently.  Patient does not currently have a PCP as they recently retired but she has an appoint with a new PCP coming up next week.  Patient has all of her home medications still.  We will discharge the patient home with PCP follow-up.  Patient agreeable to plan.  FINAL CLINICAL IMPRESSION(S) / ED DIAGNOSES   Near syncope   Note:  This document was prepared using Dragon voice recognition software and may include unintentional dictation errors.   Tammy Antis,  MD 02/17/23 [phone]

## 2023-02-20 ENCOUNTER — Ambulatory Visit
Admission: EM | Admit: 2023-02-20 | Discharge: 2023-02-20 | Disposition: A | Discharge status: Home / Self Care | Payer: Medicare HMO | Attending: Emergency Medicine | Admitting: Emergency Medicine

## 2023-02-20 DIAGNOSIS — I10 Essential (primary) hypertension: Secondary | ICD-10-CM | POA: Diagnosis not present

## 2023-02-20 DIAGNOSIS — R1030 Lower abdominal pain, unspecified: Secondary | ICD-10-CM

## 2023-02-20 DIAGNOSIS — R051 Acute cough: Secondary | ICD-10-CM | POA: Diagnosis not present

## 2023-02-20 LAB — POCT URINALYSIS DIP (MANUAL ENTRY)
Bilirubin, UA: NEGATIVE
Glucose, UA: NEGATIVE mg/dL
Ketones, POC UA: NEGATIVE mg/dL
Leukocytes, UA: NEGATIVE
Nitrite, UA: NEGATIVE
Protein Ur, POC: 30 mg/dL — AB
Spec Grav, UA: 1.02 (ref 1.010–1.025)
Urobilinogen, UA: 1 U/dL
pH, UA: 6.5 (ref 5.0–8.0)

## 2023-02-20 MED ORDER — LEVOTHYROXINE SODIUM 75 MCG PO TABS: 75.0000 ug | ORAL_TABLET | Freq: Every day | ORAL | 0 refills | Status: AC

## 2023-02-20 MED ORDER — TRANDOLAPRIL 4 MG PO TABS: 4.0000 mg | ORAL_TABLET | Freq: Every day | ORAL | 0 refills | Status: DC

## 2023-02-20 MED ORDER — PANTOPRAZOLE SODIUM 40 MG PO TBEC: 40.0000 mg | DELAYED_RELEASE_TABLET | Freq: Every day | ORAL | 0 refills | Status: DC

## 2023-02-20 NOTE — ED Triage Notes (Signed)
Patient to Urgent Care with complaints of urinary frequency/ suprapubic pressure. Reports some dysuria. Symptoms started 2 weeks ago.  Also reports URI x4 days.

## 2023-02-20 NOTE — Discharge Instructions (Addendum)
Your urine does not show infection.    Your blood pressure is elevated today at 184/82; repeat 166/90.  Please have this rechecked by your primary care provider in 1-2 weeks.     Follow up with your primary care provider as scheduled.  Go to the emergency department if you have worsening symptoms.

## 2023-02-20 NOTE — ED Provider Notes (Signed)
Tammy Cannon    CSN: [id number] Arrival date & time: 02/20/23  1459      History   Chief Complaint Chief Complaint  Patient presents with   Cough   Urinary Frequency    HPI Tammy Cannon is a 82 y.o. female.  Accompanied by her daughter, patient presents with lower abdominal pain, urinary frequency, dysuria x 2 weeks.  She also reports 4-day history of cough, ear pain, sore throat.  Patient also presents for refill of several medications.  She does not currently have a PCP.  She has an appointment to establish a new PCP at [facility] clinic on 02/28/2023 but is out of her blood pressure medication as well as Protonix and Synthroid.  The history is provided by the patient, a relative and medical records.    Past Medical History:  Diagnosis Date   Anemia    Breast cancer (HCC) 1989   Right mastectomy /pos node Tammy Cannon /Dr Cerame   Cardiomegaly    Colon polyp    Complication of anesthesia    with colonoscopy   Coronary atherosclerosis of native coronary artery    COVID-19    DCIS (ductal carcinoma in situ) 08/19/2016   left breast   Diabetes mellitus without complication (HCC)    Dyspnea    GERD (gastroesophageal reflux disease)    Heart murmur    History of kidney stones    Hypothyroidism    Other and unspecified hyperlipidemia    Other chronic pulmonary heart diseases    Panic attacks    Pneumonia    covid PNA   PONV (postoperative nausea and vomiting)    Unspecified essential hypertension     Patient Active Problem List   Diagnosis Date Noted   Colon cancer (HCC) 05/05/2020   Chronic venous insufficiency 08/25/2017   Ductal carcinoma in situ (DCIS) of left breast 08/26/2016   Radial scar of breast 08/26/2016   HYPERLIPIDEMIA-MIXED 03/05/2009   Essential hypertension 03/05/2009   CAD, NATIVE VESSEL 03/05/2009   PULMONARY HYPERTENSION 03/05/2009   VENTRICULAR HYPERTROPHY, LEFT 03/05/2009    Past Surgical History:  Procedure Laterality Date    ABDOMINAL HYSTERECTOMY     BREAST BIOPSY Left 08/19/2016   DUCTAL CARCINOMA IN SITU (DCIS) INVOLVING A SCLEROSING LESION   CHOLECYSTECTOMY     COLONOSCOPY WITH PROPOFOL N/A 04/03/2020   Procedure: COLONOSCOPY WITH PROPOFOL;  Surgeon: Wyline Mood, MD;  Location: [facility] ENDOSCOPY;  Service: Gastroenterology;  Laterality: N/A;   COLONOSCOPY WITH PROPOFOL N/A 05/13/2021   Procedure: COLONOSCOPY WITH PROPOFOL;  Surgeon: Earline Mayotte, MD;  Location: [facility] ENDOSCOPY;  Service: Endoscopy;  Laterality: N/A;   LAPAROSCOPIC RIGHT COLECTOMY Right 05/05/2020   Procedure: LAPAROSCOPIC RIGHT COLECTOMY;  Surgeon: Earline Mayotte, MD;  Location: [facility] ORS;  Service: General;  Laterality: Right;   lumpectomy (otheR)     MASTECTOMY     both   SENTINEL NODE BIOPSY Left 09/13/2016   Procedure: SENTINEL NODE BIOPSY;  Surgeon: Earline Mayotte, MD;  Location: [facility] ORS;  Service: General;  Laterality: Left;   SIMPLE MASTECTOMY WITH AXILLARY SENTINEL NODE BIOPSY Left 09/13/2016   Procedure: SIMPLE MASTECTOMY;  Surgeon: Earline Mayotte, MD;  Location: [facility] ORS;  Service: General;  Laterality: Left;    OB History     Gravida  1   Para  1   Term      Preterm      AB      Living  SAB      IAB      Ectopic      Multiple      Live Births           Obstetric Comments  1st Menstrual Cycle:  13  1st Pregnancy:  20           Home Medications    Prior to Admission medications   Medication Sig Start Date End Date Taking? Authorizing Provider  ALPRAZolam Prudy Feeler) 0.5 MG tablet Take 0.5 mg by mouth daily. 12/14/22  Yes [provider]  glimepiride (AMARYL) 2 MG tablet Take 2 mg by mouth 2 (two) times daily. 02/15/23  Yes [provider]  acetaminophen (TYLENOL) 500 MG tablet Take 500 mg by mouth every 6 (six) hours as needed for headache.    [provider]  aspirin EC 81 MG tablet Take 81 mg by mouth daily as needed (heart murmur). Swallow whole.     [provider]  gabapentin (NEURONTIN) 100 MG capsule Take 2 capsules (200 mg total) by mouth at bedtime. 10/20/20   Hyatt, Max T, DPM  hydrochlorothiazide (MICROZIDE) 12.5 MG capsule Take by mouth. 07/17/20   [provider]  levothyroxine (SYNTHROID) 75 MCG tablet Take 1 tablet (75 mcg total) by mouth daily. 02/20/23   Mickie Bail, NP  linaclotide Karlene Einstein) 145 MCG CAPS capsule Take by mouth.    [provider]  metFORMIN (GLUCOPHAGE) 1000 MG tablet Take 1,000 mg by mouth at bedtime.    [provider]  pantoprazole (PROTONIX) 40 MG tablet Take 1 tablet (40 mg total) by mouth daily. 02/20/23   Mickie Bail, NP  pravastatin (PRAVACHOL) 40 MG tablet Take 40 mg by mouth at bedtime.    [provider]  trandolapril (MAVIK) 4 MG tablet Take 1 tablet (4 mg total) by mouth daily. 02/20/23   Mickie Bail, NP  traZODone (DESYREL) 50 MG tablet Take 50 mg by mouth at bedtime.     [provider]  verapamil (VERELAN PM) 240 MG 24 hr capsule Take 240 mg by mouth at bedtime.     [provider]    Family History Family History  Problem Relation Age of Onset   Breast cancer Sister     Social History Social History   Tobacco Use   Smoking status: Never   Smokeless tobacco: Never  Vaping Use   Vaping status: Never Used  Substance Use Topics   Alcohol use: No   Drug use: No     Allergies   Patient has no known allergies.   Review of Systems Review of Systems  Constitutional:  Negative for chills and fever.  HENT:  Positive for ear pain and sore throat.   Respiratory:  Positive for cough. Negative for shortness of breath.   Cardiovascular:  Negative for chest pain and palpitations.  Gastrointestinal:  Positive for abdominal pain. Negative for constipation, diarrhea, nausea and vomiting.  Genitourinary:  Positive for dysuria and frequency. Negative for hematuria.     Physical Exam Triage Vital Signs ED Triage Vitals   Encounter Vitals Group     BP      Systolic BP Percentile      Diastolic BP Percentile      Pulse      Resp      Temp      Temp src      SpO2      Weight      Height  Head Circumference      Peak Flow      Pain Score      Pain Loc      Pain Education      Exclude from Growth Chart    No data found.  Updated Vital Signs BP (!) 166/90   Pulse 66   Temp 98.1 F (36.7 C)   Resp 18   SpO2 95%   Visual Acuity Right Eye Distance:   Left Eye Distance:   Bilateral Distance:    Right Eye Near:   Left Eye Near:    Bilateral Near:     Physical Exam Constitutional:      General: She is not in acute distress. HENT:     Right Ear: Tympanic membrane normal.     Left Ear: Tympanic membrane normal.     Nose: Nose normal.     Mouth/Throat:     Mouth: Mucous membranes are moist.     Pharynx: Oropharynx is clear.  Cardiovascular:     Rate and Rhythm: Normal rate and regular rhythm.     Heart sounds: Normal heart sounds.  Pulmonary:     Effort: Pulmonary effort is normal. No respiratory distress.     Breath sounds: Normal breath sounds.  Abdominal:     General: Bowel sounds are normal.     Palpations: Abdomen is soft.     Tenderness: There is no abdominal tenderness. There is no right CVA tenderness, left CVA tenderness, guarding or rebound.  Skin:    General: Skin is warm and dry.  Neurological:     Mental Status: She is alert.      UC Treatments / Results  Labs (all labs ordered are listed, but only abnormal results are displayed) Labs Reviewed  POCT URINALYSIS DIP (MANUAL ENTRY) - Abnormal; Notable for the following components:      Result Value   Clarity, UA turbid (*)    Blood, UA trace-intact (*)    Protein Ur, POC =30 (*)    All other components within normal limits    EKG   Radiology No results found.  Procedures Procedures (including critical care time)  Medications Ordered in UC Medications - No data to display  Initial Impression /  Assessment and Plan / UC Course  I have reviewed the triage vital signs and the nursing notes.  Pertinent labs & imaging results that were available during my care of the patient were reviewed by me and considered in my medical decision making (see chart for details).    Lower abdominal pain, cough, elevated blood pressure reading with hypertension.  Afebrile.  Abdomen is soft and nontender with good bowel sounds.  Urine does not show signs of infection.  Lungs are clear and O2 sat is 95% on room air.  30-day refill provided on trandolapril, pantoprazole, levothyroxine.  Patient has an appointment to establish with her new PCP on the 14th.  Instructed her to her to keep this appointment as scheduled.  ED precautions discussed.  Education provided on abdominal pain, cough, managing hypertension.  Patient and her daughter agree to plan of care.  Final Clinical Impressions(s) / UC Diagnoses   Final diagnoses:  Lower abdominal pain  Acute cough  Elevated blood pressure reading in office with diagnosis of hypertension     Discharge Instructions      Your urine does not show infection.    Your blood pressure is elevated today at 184/82; repeat 166/90.  Please have this rechecked by  your primary care provider in 1-2 weeks.     Follow up with your primary care provider as scheduled.  Go to the emergency department if you have worsening symptoms.          ED Prescriptions     Medication Sig Dispense Auth. Provider   trandolapril (MAVIK) 4 MG tablet Take 1 tablet (4 mg total) by mouth daily. 30 tablet Mickie Bail, NP   pantoprazole (PROTONIX) 40 MG tablet Take 1 tablet (40 mg total) by mouth daily. 30 tablet Mickie Bail, NP   levothyroxine (SYNTHROID) 75 MCG tablet Take 1 tablet (75 mcg total) by mouth daily. 30 tablet Mickie Bail, NP      PDMP not reviewed this encounter.   Mickie Bail, NP 02/20/23 1544

## 2023-04-22 ENCOUNTER — Encounter: Payer: Self-pay | Admitting: Internal Medicine

## 2023-04-22 ENCOUNTER — Inpatient Hospital Stay: Payer: BC Managed Care – PPO

## 2023-04-22 ENCOUNTER — Inpatient Hospital Stay: Payer: BC Managed Care – PPO | Attending: Internal Medicine | Admitting: Internal Medicine

## 2023-04-22 VITALS — BP 170/82 | HR 64 | Temp 97.7°F | Resp 16 | Wt 146.9 lb

## 2023-04-22 DIAGNOSIS — E039 Hypothyroidism, unspecified: Secondary | ICD-10-CM | POA: Diagnosis not present

## 2023-04-22 DIAGNOSIS — N183 Chronic kidney disease, stage 3 unspecified: Secondary | ICD-10-CM | POA: Diagnosis not present

## 2023-04-22 DIAGNOSIS — I129 Hypertensive chronic kidney disease with stage 1 through stage 4 chronic kidney disease, or unspecified chronic kidney disease: Secondary | ICD-10-CM | POA: Diagnosis not present

## 2023-04-22 DIAGNOSIS — D649 Anemia, unspecified: Secondary | ICD-10-CM | POA: Insufficient documentation

## 2023-04-22 DIAGNOSIS — Z85038 Personal history of other malignant neoplasm of large intestine: Secondary | ICD-10-CM | POA: Insufficient documentation

## 2023-04-22 DIAGNOSIS — Z9049 Acquired absence of other specified parts of digestive tract: Secondary | ICD-10-CM | POA: Diagnosis not present

## 2023-04-22 DIAGNOSIS — E1122 Type 2 diabetes mellitus with diabetic chronic kidney disease: Secondary | ICD-10-CM | POA: Diagnosis not present

## 2023-04-22 DIAGNOSIS — Z853 Personal history of malignant neoplasm of breast: Secondary | ICD-10-CM | POA: Insufficient documentation

## 2023-04-22 DIAGNOSIS — Z9011 Acquired absence of right breast and nipple: Secondary | ICD-10-CM | POA: Insufficient documentation

## 2023-04-22 DIAGNOSIS — K5900 Constipation, unspecified: Secondary | ICD-10-CM | POA: Insufficient documentation

## 2023-04-22 LAB — IRON AND TIBC
Iron: 36 ug/dL (ref 28–170)
Saturation Ratios: 7 % — ABNORMAL LOW (ref 10.4–31.8)
TIBC: 552 ug/dL — ABNORMAL HIGH (ref 250–450)
UIBC: 516 ug/dL

## 2023-04-22 LAB — CBC WITH DIFFERENTIAL/PLATELET
Abs Immature Granulocytes: 0.08 10*3/uL — ABNORMAL HIGH (ref 0.00–0.07)
Basophils Absolute: 0 10*3/uL (ref 0.0–0.1)
Basophils Relative: 1 %
Eosinophils Absolute: 0.1 10*3/uL (ref 0.0–0.5)
Eosinophils Relative: 2 %
HCT: 32.5 % — ABNORMAL LOW (ref 36.0–46.0)
Hemoglobin: 9.6 g/dL — ABNORMAL LOW (ref 12.0–15.0)
Immature Granulocytes: 1 %
Lymphocytes Relative: 35 %
Lymphs Abs: 2.2 10*3/uL (ref 0.7–4.0)
MCH: 25 pg — ABNORMAL LOW (ref 26.0–34.0)
MCHC: 29.5 g/dL — ABNORMAL LOW (ref 30.0–36.0)
MCV: 84.6 fL (ref 80.0–100.0)
Monocytes Absolute: 0.4 10*3/uL (ref 0.1–1.0)
Monocytes Relative: 7 %
Neutro Abs: 3.4 10*3/uL (ref 1.7–7.7)
Neutrophils Relative %: 54 %
Platelets: 251 10*3/uL (ref 150–400)
RBC: 3.84 MIL/uL — ABNORMAL LOW (ref 3.87–5.11)
RDW: 16.7 % — ABNORMAL HIGH (ref 11.5–15.5)
WBC: 6.3 10*3/uL (ref 4.0–10.5)
nRBC: 0 % (ref 0.0–0.2)

## 2023-04-22 LAB — FERRITIN: Ferritin: 9 ng/mL — ABNORMAL LOW (ref 11–307)

## 2023-04-22 LAB — RETICULOCYTES
Immature Retic Fract: 19 % — ABNORMAL HIGH (ref 2.3–15.9)
RBC.: 3.82 MIL/uL — ABNORMAL LOW (ref 3.87–5.11)
Retic Count, Absolute: 67.6 10*3/uL (ref 19.0–186.0)
Retic Ct Pct: 1.8 % (ref 0.4–3.1)

## 2023-04-22 LAB — VITAMIN B12: Vitamin B-12: 440 pg/mL (ref 180–914)

## 2023-04-22 LAB — FOLATE: Folate: 19.2 ng/mL (ref >5.9)

## 2023-04-22 NOTE — Progress Notes (Signed)
[facility]  [phone] [phone]  ID: Tammy Cannon OB: 12-07-1940  MR#: [id number]  BMW#:[id number]  Patient Care Team: [facility] as PCP - Baruch Goldmann, MD as Referring Physician (Obstetrics and Gynecology) Lemar Livings, Merrily Pew, MD (General Surgery) Michaelyn Barter, MD as Consulting Physician (Oncology)  REFERRING PROVIDER: Dr. Sampson Goon  REASON FOR REFERRAL: Anemia  HPI: Tammy Cannon is a 82 y.o. female with past medical history of right breast cancer status postmastectomy in 1989, DCIS of left breast, GERD, hypothyroidism, colon cancer status post right hemicolectomy in December 2021 was referred to hematology for workup of anemia.  Patient has history of mild anemia since 2021.  Ranging between 10-11.  MCV normal.  WBC and platelets normal.  Ferritin 11 from October 2024.  SPEP showed M protein of 1.1.  IFE not available.From 04/04/2023 hemoglobin 10.1 creatinine 1.3, EGFR 41.  Patient has been feeling more sleepy and fatigued for the past few months.  Feeling dizzy.  Was previously on oral iron which she stopped due to constipation.  She is on daily vitamin B12.  Reports neuropathy in left hand and bilateral feet due to diabetes.  She has history of colon cancer status post right hemicolectomy in December 2021.  Showed 2.1 x 0.9 x 0.4 cm moderately differentiated adenocarcinoma.  0/25 lymph node negative for malignancy.  Invades submucosa.  Margins negative.  pT1 N0c M0.  She was seen by Dr. Smith Robert but did not follow-up after the surgery.  Did not need any adjuvant chemotherapy. Colonoscopy from December 2022 by Dr. Donnalee Curry was normal.  Repeat recommended in 3 years.  REVIEW OF SYSTEMS:   ROS  As per HPI. Otherwise, a complete review of systems is negative.  PAST MEDICAL HISTORY: Past Medical History:  Diagnosis Date   Anemia    Breast cancer (HCC) 1989   Right mastectomy /pos node /chemo /Dr Cerame    Cardiomegaly    Colon polyp    Complication of anesthesia    with colonoscopy   Coronary atherosclerosis of native coronary artery    COVID-19    DCIS (ductal carcinoma in situ) 08/19/2016   left breast   Diabetes mellitus without complication (HCC)    Dyspnea    GERD (gastroesophageal reflux disease)    Heart murmur    History of kidney stones    Hypothyroidism    Other and unspecified hyperlipidemia    Other chronic pulmonary heart diseases    Panic attacks    Pneumonia    covid PNA   PONV (postoperative nausea and vomiting)    Unspecified essential hypertension     PAST SURGICAL HISTORY: Past Surgical History:  Procedure Laterality Date   ABDOMINAL HYSTERECTOMY     BREAST BIOPSY Left 08/19/2016   DUCTAL CARCINOMA IN SITU (DCIS) INVOLVING A SCLEROSING LESION   CHOLECYSTECTOMY     COLONOSCOPY WITH PROPOFOL N/A 04/03/2020   Procedure: COLONOSCOPY WITH PROPOFOL;  Surgeon: Wyline Mood, MD;  Location: [facility] ENDOSCOPY;  Service: Gastroenterology;  Laterality: N/A;   COLONOSCOPY WITH PROPOFOL N/A 05/13/2021   Procedure: COLONOSCOPY WITH PROPOFOL;  Surgeon: Earline Mayotte, MD;  Location: [facility] ENDOSCOPY;  Service: Endoscopy;  Laterality: N/A;   LAPAROSCOPIC RIGHT COLECTOMY Right 05/05/2020   Procedure: LAPAROSCOPIC RIGHT COLECTOMY;  Surgeon: Earline Mayotte, MD;  Location: [facility] ORS;  Service: General;  Laterality: Right;   lumpectomy (otheR)     MASTECTOMY     both   SENTINEL NODE BIOPSY Left 09/13/2016  Procedure: SENTINEL NODE BIOPSY;  Surgeon: Earline Mayotte, MD;  Location: [facility] ORS;  Service: General;  Laterality: Left;   SIMPLE MASTECTOMY WITH AXILLARY SENTINEL NODE BIOPSY Left 09/13/2016   Procedure: SIMPLE MASTECTOMY;  Surgeon: Earline Mayotte, MD;  Location: [facility] ORS;  Service: General;  Laterality: Left;    FAMILY HISTORY: Family History  Problem Relation Age of Onset   Breast cancer Sister     HEALTH MAINTENANCE: Social History   Tobacco Use    Smoking status: Never   Smokeless tobacco: Never  Vaping Use   Vaping status: Never Used  Substance Use Topics   Alcohol use: No   Drug use: No     No Known Allergies  Current Outpatient Medications  Medication Sig Dispense Refill   ALPRAZolam (XANAX) 0.5 MG tablet Take 0.5 mg by mouth daily.     aspirin EC 81 MG tablet Take 81 mg by mouth daily as needed (heart murmur). Swallow whole.     glimepiride (AMARYL) 2 MG tablet Take 2 mg by mouth 2 (two) times daily.     hydrochlorothiazide (MICROZIDE) 12.5 MG capsule Take by mouth.     levothyroxine (SYNTHROID) 75 MCG tablet Take 1 tablet (75 mcg total) by mouth daily. 30 tablet 0   linaclotide (LINZESS) 145 MCG CAPS capsule Take by mouth.     pantoprazole (PROTONIX) 40 MG tablet Take 1 tablet (40 mg total) by mouth daily. 30 tablet 0   pravastatin (PRAVACHOL) 40 MG tablet Take 40 mg by mouth at bedtime.     trandolapril (MAVIK) 4 MG tablet Take 1 tablet (4 mg total) by mouth daily. 30 tablet 0   traZODone (DESYREL) 50 MG tablet Take 50 mg by mouth at bedtime.      verapamil (VERELAN PM) 240 MG 24 hr capsule Take 240 mg by mouth at bedtime.      No current facility-administered medications for this visit.    OBJECTIVE: Vitals:   04/22/23 1121 04/22/23 1124  BP: (!) 178/82 (!) 170/82  Pulse: 64   Resp: 16   Temp: 97.7 F (36.5 C)   SpO2: 99%      Body mass index is 26.02 kg/m.      General: Well-developed, well-nourished, no acute distress. Eyes: Pink conjunctiva, anicteric sclera. HEENT: Normocephalic, moist mucous membranes, clear oropharnyx. Lungs: Clear to auscultation bilaterally. Heart: Regular rate and rhythm. No rubs, murmurs, or gallops. Abdomen: Soft, nontender, nondistended. No organomegaly noted, normoactive bowel sounds. Musculoskeletal: No edema, cyanosis, or clubbing. Neuro: Alert, answering all questions appropriately. Cranial nerves grossly intact. Skin: No rashes or petechiae noted. Psych: Normal  affect. Lymphatics: No cervical, calvicular, axillary or inguinal LAD.   LAB RESULTS:  Lab Results  Component Value Date   NA 138 02/16/2023   K 3.9 02/16/2023   CL 102 02/16/2023   CO2 23 02/16/2023   GLUCOSE 224 (H) 02/16/2023   BUN 23 02/16/2023   CREATININE 1.16 (H) 02/16/2023   CALCIUM 9.3 02/16/2023   PROT 8.3 (H) 02/16/2023   ALBUMIN 4.3 02/16/2023   AST 25 02/16/2023   ALT 20 02/16/2023   ALKPHOS 90 02/16/2023   BILITOT 0.8 02/16/2023   GFRNONAA 47 (L) 02/16/2023   GFRAA 53 (L) 07/14/2019    Lab Results  Component Value Date   WBC 5.3 02/16/2023   NEUTROABS 3.1 02/16/2023   HGB 10.5 (L) 02/16/2023   HCT 35.0 (L) 02/16/2023   MCV 83.7 02/16/2023   PLT 237 02/16/2023    Lab Results  Component Value Date   TIBC 493 (H) 04/07/2020   FERRITIN 8 (L) 04/07/2020   IRONPCTSAT 7 (L) 04/07/2020     STUDIES: No results found.  ASSESSMENT AND PLAN:   BASHA HARSHMAN is a 82 y.o. female with pmh of right breast cancer status postmastectomy in 1989, DCIS of left breast, GERD, hypothyroidism, colon cancer status post right hemicolectomy in December 2021 was referred to hematology for workup of anemia.  # Normocytic anemia # CKD stage III -Unknown etiology.  Present since 2021.  Ranging between 10-11.  Ferritin from October 2024 was 11.  Has recently stopped iron supplements due to constipation. -Discussed about different etiologies for anemia such as nutritional deficiencies, chronic kidney disease, paraproteinemia.  I will obtain labs as below.  SPEP done on 04/04/2023 showed 1.1 M protein.  IFE not done. -I will follow-up with her in 2 weeks via video visit to discuss the labs.  I did briefly discuss about IV Venofer weekly x 5 doses since she is intolerant to iron supplements.  We also discussed about EPO injections every month which I think at this point she does not need it since her hemoglobin has been above 10.  She would like to go back on iron supplements 2-3  times a week with MiraLAX.  # History of colon cancer - She has history of colon cancer status post right hemicolectomy in December 2021.  Showed 2.1 x 0.9 x 0.4 cm moderately differentiated adenocarcinoma.  0/25 lymph node negative for malignancy.  Invades submucosa.  Margins negative.  pT1 N0c M0.  She was seen by Dr. Smith Robert but did not follow-up after the surgery.  Did not need any adjuvant chemotherapy. Colonoscopy from December 2022 by Dr. Donnalee Curry was normal.  Repeat recommended in 3 years.   Orders Placed This Encounter  Procedures   CBC with Differential/Platelet   Iron and TIBC   Ferritin   Vitamin B12   Folate   Kappa/lambda light chains   Multiple Myeloma Panel (SPEP&IFE w/QIG)   Reticulocytes   Immunofixation, urine   RTC in 2 weeks for video visit.  Patient expressed understanding and was in agreement with this plan. She also understands that She can call clinic at any time with any questions, concerns, or complaints.   I spent a total of 45 minutes reviewing chart data, face-to-face evaluation with the patient, counseling and coordination of care as detailed above.  Michaelyn Barter, MD   04/22/2023 11:40 AM

## 2023-04-22 NOTE — Progress Notes (Signed)
Patient states that she used to see Dr. Alena Bills for her colon cancer. She is having some bad constipation and stomach discomfort, that's why she stopped taking the iron pills. Chest pain and some shortness of breath with exertion. Starting about 3 months ago she started having some pain radiate from her left ear down to her neck, not constant just comes and goes.

## 2023-04-25 LAB — KAPPA/LAMBDA LIGHT CHAINS
Kappa free light chain: 31.5 mg/L — ABNORMAL HIGH (ref 3.3–19.4)
Kappa, lambda light chain ratio: 0.3 (ref 0.26–1.65)
Lambda free light chains: 103.7 mg/L — ABNORMAL HIGH (ref 5.7–26.3)

## 2023-04-27 LAB — IMMUNOFIXATION, URINE

## 2023-05-02 LAB — MULTIPLE MYELOMA PANEL, SERUM
Albumin SerPl Elph-Mcnc: 3.9 g/dL (ref 2.9–4.4)
Albumin/Glob SerPl: 1.1 (ref 0.7–1.7)
Alpha 1: 0.3 g/dL (ref 0.0–0.4)
Alpha2 Glob SerPl Elph-Mcnc: 0.7 g/dL (ref 0.4–1.0)
B-Globulin SerPl Elph-Mcnc: 1.1 g/dL (ref 0.7–1.3)
Gamma Glob SerPl Elph-Mcnc: 1.6 g/dL (ref 0.4–1.8)
Globulin, Total: 3.6 g/dL (ref 2.2–3.9)
IgA: 52 mg/dL — ABNORMAL LOW (ref 64–422)
IgG (Immunoglobin G), Serum: 1759 mg/dL — ABNORMAL HIGH (ref 586–1602)
IgM (Immunoglobulin M), Srm: 36 mg/dL (ref 26–217)
M Protein SerPl Elph-Mcnc: 1.2 g/dL — ABNORMAL HIGH
Total Protein ELP: 7.5 g/dL (ref 6.0–8.5)

## 2023-05-05 ENCOUNTER — Telehealth: Payer: BC Managed Care – PPO | Admitting: Internal Medicine

## 2023-05-05 ENCOUNTER — Inpatient Hospital Stay (HOSPITAL_BASED_OUTPATIENT_CLINIC_OR_DEPARTMENT_OTHER): Payer: BC Managed Care – PPO | Admitting: Internal Medicine

## 2023-05-05 DIAGNOSIS — D472 Monoclonal gammopathy: Secondary | ICD-10-CM | POA: Insufficient documentation

## 2023-05-05 DIAGNOSIS — N183 Chronic kidney disease, stage 3 unspecified: Secondary | ICD-10-CM | POA: Diagnosis not present

## 2023-05-05 DIAGNOSIS — D649 Anemia, unspecified: Secondary | ICD-10-CM | POA: Diagnosis not present

## 2023-05-05 DIAGNOSIS — Z85038 Personal history of other malignant neoplasm of large intestine: Secondary | ICD-10-CM

## 2023-05-05 DIAGNOSIS — D509 Iron deficiency anemia, unspecified: Secondary | ICD-10-CM | POA: Insufficient documentation

## 2023-05-05 NOTE — Progress Notes (Signed)
Called and spoke with the patient's daughter, and she confirmed the appointment. She mentioned how patient complains about her lower abdomen hurting a lot. She is also having trouble breathing when she lays in bed.

## 2023-05-05 NOTE — Progress Notes (Signed)
[facility]  [phone] [phone]  I connected with Tammy Cannon on 05/05/23 at 10:00 AM EST by my chart video and verified that I am speaking with the correct person using two identifiers.   I discussed the limitations, risks, security and privacy concerns of performing an evaluation and management service by telemedicine and the availability of in-person appointments. I also discussed with the patient that there may be a patient responsible charge related to this service. The patient expressed understanding and agreed to proceed.   Other persons participating in the visit and their role in the encounter: daughter   Patient's location: home  Provider's location: clinic   Chief Complaint: discuss labs   ID: Tammy Cannon OB: 25-Dec-1940  MR#: [id number]  XLK#:[id number]  Patient Care Team: [facility] as PCP - Baruch Goldmann, MD as Referring Physician (Obstetrics and Gynecology) Lemar Livings, Merrily Pew, MD (General Surgery) Michaelyn Barter, MD as Consulting Physician (Oncology)  REFERRING PROVIDER: Dr. Sampson Goon  REASON FOR REFERRAL: Anemia  HPI: Tammy Cannon is a 82 y.o. female with past medical history of right breast cancer status postmastectomy in 1989, DCIS of left breast, GERD, hypothyroidism, colon cancer status post right hemicolectomy in December 2021 was referred to hematology for workup of anemia.  Patient has history of mild anemia since 2021.  Ranging between 10-11.  MCV normal.  WBC and platelets normal.  Ferritin 11 from October 2024.  SPEP showed M protein of 1.1.  IFE not available.From 04/04/2023 hemoglobin 10.1 creatinine 1.3, EGFR 41.  Patient has been feeling more sleepy and fatigued for the past few months.  Feeling dizzy.  Was previously on oral iron which she stopped due to constipation.  She is on daily vitamin B12.  Reports neuropathy in left hand and bilateral feet due to diabetes.  She has history  of colon cancer status post right hemicolectomy in December 2021.  Showed 2.1 x 0.9 x 0.4 cm moderately differentiated adenocarcinoma.  0/25 lymph node negative for malignancy.  Invades submucosa.  Margins negative.  pT1 N0c M0.  She was seen by Dr. Smith Robert but did not follow-up after the surgery.  Did not need any adjuvant chemotherapy. Colonoscopy from December 2022 by Dr. Donnalee Curry was normal.  Repeat recommended in 3 years.  Interval history Connected with the patient via MyChart video visit to discuss labs. Patient reports some rectal pain.  She is constipated.  Has started MiraLAX.  She reports palpitations at night which has been going on for several years.  Is unchanged.  Reports some shortness of breath on exertion.   REVIEW OF SYSTEMS:   ROS  As per HPI. Otherwise, a complete review of systems is negative.  PAST MEDICAL HISTORY: Past Medical History:  Diagnosis Date   Anemia    Breast cancer (HCC) 1989   Right mastectomy /pos node /chemo /Dr Cerame   Cardiomegaly    Colon polyp    Complication of anesthesia    with colonoscopy   Coronary atherosclerosis of native coronary artery    COVID-19    DCIS (ductal carcinoma in situ) 08/19/2016   left breast   Diabetes mellitus without complication (HCC)    Dyspnea    GERD (gastroesophageal reflux disease)    Heart murmur    History of kidney stones    Hypothyroidism    Other and unspecified hyperlipidemia    Other chronic pulmonary heart diseases    Panic attacks    Pneumonia  covid PNA   PONV (postoperative nausea and vomiting)    Unspecified essential hypertension     PAST SURGICAL HISTORY: Past Surgical History:  Procedure Laterality Date   ABDOMINAL HYSTERECTOMY     BREAST BIOPSY Left 08/19/2016   DUCTAL CARCINOMA IN SITU (DCIS) INVOLVING A SCLEROSING LESION   CHOLECYSTECTOMY     COLONOSCOPY WITH PROPOFOL N/A 04/03/2020   Procedure: COLONOSCOPY WITH PROPOFOL;  Surgeon: Wyline Mood, MD;  Location: [facility]  ENDOSCOPY;  Service: Gastroenterology;  Laterality: N/A;   COLONOSCOPY WITH PROPOFOL N/A 05/13/2021   Procedure: COLONOSCOPY WITH PROPOFOL;  Surgeon: Earline Mayotte, MD;  Location: [facility] ENDOSCOPY;  Service: Endoscopy;  Laterality: N/A;   LAPAROSCOPIC RIGHT COLECTOMY Right 05/05/2020   Procedure: LAPAROSCOPIC RIGHT COLECTOMY;  Surgeon: Earline Mayotte, MD;  Location: [facility] ORS;  Service: General;  Laterality: Right;   lumpectomy (otheR)     MASTECTOMY     both   SENTINEL NODE BIOPSY Left 09/13/2016   Procedure: SENTINEL NODE BIOPSY;  Surgeon: Earline Mayotte, MD;  Location: [facility] ORS;  Service: General;  Laterality: Left;   SIMPLE MASTECTOMY WITH AXILLARY SENTINEL NODE BIOPSY Left 09/13/2016   Procedure: SIMPLE MASTECTOMY;  Surgeon: Earline Mayotte, MD;  Location: [facility] ORS;  Service: General;  Laterality: Left;    FAMILY HISTORY: Family History  Problem Relation Age of Onset   Breast cancer Sister     HEALTH MAINTENANCE: Social History   Tobacco Use   Smoking status: Never   Smokeless tobacco: Never  Vaping Use   Vaping status: Never Used  Substance Use Topics   Alcohol use: No   Drug use: No     No Known Allergies  Current Outpatient Medications  Medication Sig Dispense Refill   ALPRAZolam (XANAX) 0.5 MG tablet Take 0.5 mg by mouth daily.     aspirin EC 81 MG tablet Take 81 mg by mouth daily as needed (heart murmur). Swallow whole.     glimepiride (AMARYL) 2 MG tablet Take 2 mg by mouth 2 (two) times daily.     hydrochlorothiazide (MICROZIDE) 12.5 MG capsule Take by mouth.     levothyroxine (SYNTHROID) 75 MCG tablet Take 1 tablet (75 mcg total) by mouth daily. 30 tablet 0   linaclotide (LINZESS) 145 MCG CAPS capsule Take by mouth.     pantoprazole (PROTONIX) 40 MG tablet Take 1 tablet (40 mg total) by mouth daily. 30 tablet 0   pravastatin (PRAVACHOL) 40 MG tablet Take 40 mg by mouth at bedtime.     trandolapril (MAVIK) 4 MG tablet Take 1 tablet (4 mg total) by  mouth daily. 30 tablet 0   traZODone (DESYREL) 50 MG tablet Take 50 mg by mouth at bedtime.      verapamil (VERELAN PM) 240 MG 24 hr capsule Take 240 mg by mouth at bedtime.      No current facility-administered medications for this visit.    OBJECTIVE: There were no vitals filed for this visit.    There is no height or weight on file to calculate BMI.      Physical exam - not performed.    LAB RESULTS:  Lab Results  Component Value Date   NA 138 02/16/2023   K 3.9 02/16/2023   CL 102 02/16/2023   CO2 23 02/16/2023   GLUCOSE 224 (H) 02/16/2023   BUN 23 02/16/2023   CREATININE 1.16 (H) 02/16/2023   CALCIUM 9.3 02/16/2023   PROT 8.3 (H) 02/16/2023   ALBUMIN 4.3 02/16/2023  AST 25 02/16/2023   ALT 20 02/16/2023   ALKPHOS 90 02/16/2023   BILITOT 0.8 02/16/2023   GFRNONAA 47 (L) 02/16/2023   GFRAA 53 (L) 07/14/2019    Lab Results  Component Value Date   WBC 6.3 04/22/2023   NEUTROABS 3.4 04/22/2023   HGB 9.6 (L) 04/22/2023   HCT 32.5 (L) 04/22/2023   MCV 84.6 04/22/2023   PLT 251 04/22/2023    Lab Results  Component Value Date   TIBC 552 (H) 04/22/2023   TIBC 493 (H) 04/07/2020   FERRITIN 9 (L) 04/22/2023   FERRITIN 8 (L) 04/07/2020   IRONPCTSAT 7 (L) 04/22/2023   IRONPCTSAT 7 (L) 04/07/2020     STUDIES: No results found.  ASSESSMENT AND PLAN:   Tammy Cannon is a 82 y.o. female with pmh of right breast cancer status postmastectomy in 1989, DCIS of left breast, GERD, hypothyroidism, colon cancer status post right hemicolectomy in December 2021 was referred to hematology for workup of anemia.  # Iron deficiency anemia # CKD stage III -Hemoglobin ranges between 10-11.  Cannot tolerate oral iron due to constipation. -Labs were discussed with the patient and her daughter.  Hemoglobin of 9.6.  Ferritin 9 and saturation 7%.  Discussed about IV Venofer 200 mg weekly x 5 doses.  Side effects such as nausea, back pain, chest pain and occasional risk of allergic  reaction was discussed.  She is agreeable to proceed. -Last colonoscopy was in 2022 by Dr. Lemar Livings which was unremarkable.  Repeat was recommended in 3 years.  Now with new iron deficiency and history of colon cancer, will reach out to GI if her colonoscopy needs to be repeated sooner.  I will follow-up in 3 months with repeat labs.  # IgG lambda MGUS, low risk -SPEP/IFE 1.2 g/dL IgG lambda protein. Kappa 31.5.  Lambda 103.7.  Kappa lambda ratio of 0.30. -She has chronic kidney disease stage III since 2020.  Her creatinine is overall stable.  Likely secondary to hypertension and diabetes. -She has anemia however she is severely iron deficient too.  Will monitor hemoglobin after iron repletion.  # History of colon cancer - She has history of colon cancer status post right hemicolectomy in December 2021.  Showed 2.1 x 0.9 x 0.4 cm moderately differentiated adenocarcinoma.  0/25 lymph node negative for malignancy.  Invades submucosa.  Margins negative.  pT1 N0c M0.  She was seen by Dr. Smith Robert but did not follow-up after the surgery.  Did not need any adjuvant chemotherapy. Colonoscopy from December 2022 by Dr. Donnalee Curry was normal.  Repeat recommended in 3 years.   Orders Placed This Encounter  Procedures   CBC with Differential (Cancer Center Only)   Iron and TIBC([facility])   CMP (Cancer Center only)   Ferritin   Multiple Myeloma Panel (SPEP&IFE w/QIG)   Kappa/lambda light chains   RTC in 3 months for MD visit, labs, Venofer  Patient expressed understanding and was in agreement with this plan. She also understands that She can call clinic at any time with any questions, concerns, or complaints.   I spent a total of 30 minutes reviewing chart data, face-to-face evaluation with the patient, counseling and coordination of care as detailed above.  Michaelyn Barter, MD   05/05/2023 10:40 AM

## 2023-05-06 ENCOUNTER — Other Ambulatory Visit: Payer: Self-pay | Admitting: *Deleted

## 2023-05-06 DIAGNOSIS — D509 Iron deficiency anemia, unspecified: Secondary | ICD-10-CM

## 2023-05-09 ENCOUNTER — Inpatient Hospital Stay: Payer: BC Managed Care – PPO

## 2023-05-09 VITALS — BP 177/76 | HR 60 | Temp 97.1°F | Resp 16

## 2023-05-09 DIAGNOSIS — D649 Anemia, unspecified: Secondary | ICD-10-CM | POA: Diagnosis not present

## 2023-05-09 DIAGNOSIS — D509 Iron deficiency anemia, unspecified: Secondary | ICD-10-CM

## 2023-05-09 MED ORDER — IRON SUCROSE 20 MG/ML IV SOLN
200.0000 mg | Freq: Once | INTRAVENOUS | Status: AC
  Administered 2023-05-09: 200 mg via INTRAVENOUS
  Filled 2023-05-09: qty 10

## 2023-05-09 NOTE — Patient Instructions (Signed)
Iron Sucrose Injection What is this medication? IRON SUCROSE (EYE ern SOO krose) treats low levels of iron (iron deficiency anemia) in people with kidney disease. Iron is a mineral that plays an important role in making red blood cells, which carry oxygen from your lungs to the rest of your body. This medicine may be used for other purposes; ask your health care provider or pharmacist if you have questions. COMMON BRAND NAME(S): Venofer What should I tell my care team before I take this medication? They need to know if you have any of these conditions: Anemia not caused by low iron levels Heart disease High levels of iron in the blood Kidney disease Liver disease An unusual or allergic reaction to iron, other medications, foods, dyes, or preservatives Pregnant or trying to get pregnant Breastfeeding How should I use this medication? This medication is for infusion into a vein. It is given in a hospital or clinic setting. Talk to your care team about the use of this medication in children. While this medication may be prescribed for children as young as 2 years for selected conditions, precautions do apply. Overdosage: If you think you have taken too much of this medicine contact a poison control center or emergency room at once. NOTE: This medicine is only for you. Do not share this medicine with others. What if I miss a dose? Keep appointments for follow-up doses. It is important not to miss your dose. Call your care team if you are unable to keep an appointment. What may interact with this medication? Do not take this medication with any of the following: Deferoxamine Dimercaprol Other iron products This medication may also interact with the following: Chloramphenicol Deferasirox This list may not describe all possible interactions. Give your health care provider a list of all the medicines, herbs, non-prescription drugs, or dietary supplements you use. Also tell them if you smoke,  drink alcohol, or use illegal drugs. Some items may interact with your medicine. What should I watch for while using this medication? Visit your care team regularly. Tell your care team if your symptoms do not start to get better or if they get worse. You may need blood work done while you are taking this medication. You may need to follow a special diet. Talk to your care team. Foods that contain iron include: whole grains/cereals, dried fruits, beans, or peas, leafy green vegetables, and organ meats (liver, kidney). What side effects may I notice from receiving this medication? Side effects that you should report to your care team as soon as possible: Allergic reactions--skin rash, itching, hives, swelling of the face, lips, tongue, or throat Low blood pressure--dizziness, feeling faint or lightheaded, blurry vision Shortness of breath Side effects that usually do not require medical attention (report to your care team if they continue or are bothersome): Flushing Headache Joint pain Muscle pain Nausea Pain, redness, or irritation at injection site This list may not describe all possible side effects. Call your doctor for medical advice about side effects. You may report side effects to FDA at 1-800-FDA-1088. Where should I keep my medication? This medication is given in a hospital or clinic. It will not be stored at home. NOTE: This sheet is a summary. It may not cover all possible information. If you have questions about this medicine, talk to your doctor, pharmacist, or health care provider.  2024 Elsevier/Gold Standard (2022-10-08 00:00:00)

## 2023-05-16 ENCOUNTER — Inpatient Hospital Stay: Payer: BC Managed Care – PPO

## 2023-05-16 VITALS — BP 185/79 | HR 60 | Temp 98.0°F

## 2023-05-16 DIAGNOSIS — D649 Anemia, unspecified: Secondary | ICD-10-CM | POA: Diagnosis not present

## 2023-05-16 DIAGNOSIS — D509 Iron deficiency anemia, unspecified: Secondary | ICD-10-CM

## 2023-05-16 MED ORDER — IRON SUCROSE 20 MG/ML IV SOLN
200.0000 mg | Freq: Once | INTRAVENOUS | Status: AC
  Administered 2023-05-16: 200 mg via INTRAVENOUS
  Filled 2023-05-16: qty 10

## 2023-05-16 MED ORDER — SODIUM CHLORIDE 0.9% FLUSH
10.0000 mL | Freq: Once | INTRAVENOUS | Status: AC | PRN
Start: 2023-05-16 — End: 2023-05-16
  Administered 2023-05-16: 10 mL
  Filled 2023-05-16: qty 10

## 2023-05-23 ENCOUNTER — Encounter: Payer: Self-pay | Admitting: Internal Medicine

## 2023-05-23 ENCOUNTER — Inpatient Hospital Stay: Payer: BC Managed Care – PPO | Attending: Internal Medicine

## 2023-05-23 VITALS — BP 188/73 | HR 55 | Temp 98.1°F | Resp 19

## 2023-05-23 DIAGNOSIS — D509 Iron deficiency anemia, unspecified: Secondary | ICD-10-CM | POA: Insufficient documentation

## 2023-05-23 MED ORDER — IRON SUCROSE 20 MG/ML IV SOLN
200.0000 mg | Freq: Once | INTRAVENOUS | Status: AC
  Administered 2023-05-23: 200 mg via INTRAVENOUS

## 2023-05-23 MED ORDER — SODIUM CHLORIDE 0.9% FLUSH
10.0000 mL | Freq: Once | INTRAVENOUS | Status: AC | PRN
  Administered 2023-05-23: 10 mL
  Filled 2023-05-23: qty 10

## 2023-05-30 ENCOUNTER — Inpatient Hospital Stay: Payer: BC Managed Care – PPO

## 2023-05-30 NOTE — Progress Notes (Signed)
 Patient here today for iron  infusion. Pts bp upon arrival was 219/81. BP recheck was 201/76. Pt also reported that she had a cold last week. Her symptoms included a sore throat, watery eyes, runny nose, and a cough. She stated she called her pharmacy and they verified that she could take an old prescription she already had at home. She has been taking the antibiotics since Thursday and she states today only has a tickle in her throat. Clista, MD notified of changes and patient's elevated blood pressures. Per Clista, MD iron  infusion was held today. Pt stated she had an appointment with her doctor on Monday, 1/20, to address consistently elevated bps. Pt denies headache, blurry vision, or any other side effects of high bp at this time. Pt educated today's iron  infusion will need to be rescheduled and to notify clinic with any questions or concerns. Pt also educated to monitor bps at home and to seek emergency care if she becomes symptomatic of her high blood pressure. Pt verified understanding, daughter also verified understanding, and pt was stable at discharge.

## 2023-06-06 ENCOUNTER — Telehealth: Payer: Self-pay

## 2023-06-06 ENCOUNTER — Inpatient Hospital Stay: Payer: BC Managed Care – PPO

## 2023-06-06 VITALS — BP 132/60 | HR 63 | Temp 97.5°F | Resp 18

## 2023-06-06 DIAGNOSIS — N1832 Chronic kidney disease, stage 3b: Secondary | ICD-10-CM | POA: Diagnosis not present

## 2023-06-06 DIAGNOSIS — E782 Mixed hyperlipidemia: Secondary | ICD-10-CM | POA: Diagnosis not present

## 2023-06-06 DIAGNOSIS — I10 Essential (primary) hypertension: Secondary | ICD-10-CM | POA: Diagnosis not present

## 2023-06-06 DIAGNOSIS — C189 Malignant neoplasm of colon, unspecified: Secondary | ICD-10-CM | POA: Diagnosis not present

## 2023-06-06 DIAGNOSIS — I517 Cardiomegaly: Secondary | ICD-10-CM | POA: Diagnosis not present

## 2023-06-06 DIAGNOSIS — D509 Iron deficiency anemia, unspecified: Secondary | ICD-10-CM

## 2023-06-06 DIAGNOSIS — D472 Monoclonal gammopathy: Secondary | ICD-10-CM | POA: Diagnosis not present

## 2023-06-06 DIAGNOSIS — I272 Pulmonary hypertension, unspecified: Secondary | ICD-10-CM | POA: Diagnosis not present

## 2023-06-06 DIAGNOSIS — E1142 Type 2 diabetes mellitus with diabetic polyneuropathy: Secondary | ICD-10-CM | POA: Diagnosis not present

## 2023-06-06 MED ORDER — IRON SUCROSE 20 MG/ML IV SOLN
200.0000 mg | Freq: Once | INTRAVENOUS | Status: AC
  Administered 2023-06-06: 200 mg via INTRAVENOUS
  Filled 2023-06-06: qty 10

## 2023-06-06 NOTE — Telephone Encounter (Signed)
Called and spoke with patient and her daughter, letting them know that Dr. Alena Bills says that she doesn't need anymore iron infusions right now due to her labs being good. Patient understood and agreed with this plan.

## 2023-06-21 ENCOUNTER — Encounter: Payer: Self-pay | Admitting: Internal Medicine

## 2023-07-11 ENCOUNTER — Encounter: Payer: Self-pay | Admitting: Internal Medicine

## 2023-07-11 ENCOUNTER — Ambulatory Visit (INDEPENDENT_AMBULATORY_CARE_PROVIDER_SITE_OTHER): Payer: PPO | Admitting: Physician Assistant

## 2023-07-11 ENCOUNTER — Encounter: Payer: Self-pay | Admitting: Physician Assistant

## 2023-07-11 VITALS — BP 162/79 | HR 56 | Temp 97.7°F | Ht 63.0 in | Wt 132.0 lb

## 2023-07-11 DIAGNOSIS — D509 Iron deficiency anemia, unspecified: Secondary | ICD-10-CM

## 2023-07-11 DIAGNOSIS — R110 Nausea: Secondary | ICD-10-CM

## 2023-07-11 DIAGNOSIS — E782 Mixed hyperlipidemia: Secondary | ICD-10-CM | POA: Diagnosis not present

## 2023-07-11 DIAGNOSIS — E039 Hypothyroidism, unspecified: Secondary | ICD-10-CM | POA: Diagnosis not present

## 2023-07-11 DIAGNOSIS — K5904 Chronic idiopathic constipation: Secondary | ICD-10-CM

## 2023-07-11 DIAGNOSIS — I517 Cardiomegaly: Secondary | ICD-10-CM | POA: Diagnosis not present

## 2023-07-11 DIAGNOSIS — D472 Monoclonal gammopathy: Secondary | ICD-10-CM | POA: Diagnosis not present

## 2023-07-11 DIAGNOSIS — N1832 Chronic kidney disease, stage 3b: Secondary | ICD-10-CM | POA: Diagnosis not present

## 2023-07-11 DIAGNOSIS — K5909 Other constipation: Secondary | ICD-10-CM | POA: Diagnosis not present

## 2023-07-11 DIAGNOSIS — K649 Unspecified hemorrhoids: Secondary | ICD-10-CM | POA: Diagnosis not present

## 2023-07-11 DIAGNOSIS — E119 Type 2 diabetes mellitus without complications: Secondary | ICD-10-CM | POA: Diagnosis not present

## 2023-07-11 DIAGNOSIS — D225 Melanocytic nevi of trunk: Secondary | ICD-10-CM | POA: Diagnosis not present

## 2023-07-11 DIAGNOSIS — L538 Other specified erythematous conditions: Secondary | ICD-10-CM | POA: Diagnosis not present

## 2023-07-11 DIAGNOSIS — L814 Other melanin hyperpigmentation: Secondary | ICD-10-CM | POA: Diagnosis not present

## 2023-07-11 DIAGNOSIS — I272 Pulmonary hypertension, unspecified: Secondary | ICD-10-CM | POA: Diagnosis not present

## 2023-07-11 DIAGNOSIS — Z85038 Personal history of other malignant neoplasm of large intestine: Secondary | ICD-10-CM

## 2023-07-11 DIAGNOSIS — K625 Hemorrhage of anus and rectum: Secondary | ICD-10-CM

## 2023-07-11 DIAGNOSIS — L821 Other seborrheic keratosis: Secondary | ICD-10-CM | POA: Diagnosis not present

## 2023-07-11 DIAGNOSIS — D492 Neoplasm of unspecified behavior of bone, soft tissue, and skin: Secondary | ICD-10-CM | POA: Diagnosis not present

## 2023-07-11 DIAGNOSIS — Z0000 Encounter for general adult medical examination without abnormal findings: Secondary | ICD-10-CM | POA: Diagnosis not present

## 2023-07-11 DIAGNOSIS — I10 Essential (primary) hypertension: Secondary | ICD-10-CM | POA: Diagnosis not present

## 2023-07-11 DIAGNOSIS — D649 Anemia, unspecified: Secondary | ICD-10-CM | POA: Diagnosis not present

## 2023-07-11 DIAGNOSIS — L570 Actinic keratosis: Secondary | ICD-10-CM | POA: Diagnosis not present

## 2023-07-11 MED ORDER — HYDROCORTISONE ACETATE 25 MG RE SUPP: 25.0000 mg | Freq: Every day | RECTAL | 1 refills | Status: AC

## 2023-07-11 MED ORDER — ONDANSETRON HCL 4 MG PO TABS: 4.0000 mg | ORAL_TABLET | Freq: Three times a day (TID) | ORAL | 0 refills | Status: AC | PRN

## 2023-07-11 MED ORDER — GAVILYTE-G 236 G PO SOLR: 4000.0000 mL | Freq: Once | ORAL | 0 refills | Status: AC

## 2023-07-11 MED ORDER — GAVILYTE-G 236 G PO SOLR: 4000.0000 mL | Freq: Once | ORAL | 0 refills | Status: DC

## 2023-07-11 NOTE — Progress Notes (Signed)
 Celso Amy, PA-C [address]  [address]  [place], [place] [address]  Main: [phone]  Fax: [phone]   Gastroenterology Consultation  Referring Provider:     [facility] Primary Care Physician:  Mick Sell, MD Primary Gastroenterologist:  Celso Amy, PA-C / Dr. Wyline Mood   Reason for Consultation:     Iron deficiency anemia; history of colon cancer        HPI:   Tammy Cannon is a 83 y.o. y/o female referred for consultation & management  by Mick Sell, MD. Here to evaluate iron deficiency anemia.  Has been evaluated by hematology Dr. Alena Bills.  Has received IV iron infusions in the past few months.  She is here today with her daughter.  04/03/2020 Colonoscopy by Dr. Tobi Bastos: 20mm polyp mid ascending colon resected.  Pathology showed tubular adenoma and intramuscosal adenocarcinoma.  Referred to Dr. Smith Robert, oncology and to Surgeon.  She underwent laparoscopic right hemicolectomy 04/2020.  05/13/2021 Colonoscopy: Normal.  No recurrent polyps.  3 year repeat.  Last EGD was in 2010.  Report unavailable.  04/22/2023 Labs: Hemoglobin 9.6, MCV 84, low ferritin 9, iron saturation 7%, total iron 36.  Normal vitamin B12 and folate.  Labs consistent with iron deficiency anemia.  Lab 02/2023 showed hemoglobin 10.5.  Lab in 2021 showed hemoglobin 13.1.  GI symptoms: Patient has noticed bright red blood on the tissue after bowel movements, which she attributes to hemorrhoids.  It is worse if she is constipated.  She has mild intermittent abdominal pain which is relieved after having bowel movement.  Currently taking MiraLAX 1 teaspoon daily which helps her constipation.  She gets nauseated when drinking colonoscopy prep and is requesting nausea medicine.  She denies black stools.  Her weight is stable.  Past Medical History:  Diagnosis Date   Anemia    Breast cancer (HCC) 1989   Right mastectomy /pos node Tawanna Cooler /Dr Cerame   Cardiomegaly    Colon polyp     Complication of anesthesia    with colonoscopy   Coronary atherosclerosis of native coronary artery    COVID-19    DCIS (ductal carcinoma in situ) 08/19/2016   left breast   Diabetes mellitus without complication (HCC)    Dyspnea    GERD (gastroesophageal reflux disease)    Heart murmur    History of kidney stones    Hypothyroidism    Other and unspecified hyperlipidemia    Other chronic pulmonary heart diseases    Panic attacks    Pneumonia    covid PNA   PONV (postoperative nausea and vomiting)    Unspecified essential hypertension     Past Surgical History:  Procedure Laterality Date   ABDOMINAL HYSTERECTOMY     BREAST BIOPSY Left 08/19/2016   DUCTAL CARCINOMA IN SITU (DCIS) INVOLVING A SCLEROSING LESION   CHOLECYSTECTOMY     COLONOSCOPY WITH PROPOFOL N/A 04/03/2020   Procedure: COLONOSCOPY WITH PROPOFOL;  Surgeon: Wyline Mood, MD;  Location[facility] ENDOSCOPY;  Service: Gastroenterology;  Laterality: N/A;   COLONOSCOPY WITH PROPOFOL N/A 05/13/2021   Procedure: COLONOSCOPY WITH PROPOFOL;  Surgeon: Earline Mayotte, MD;  Location: [facility] ENDOSCOPY;  Service: Endoscopy;  Laterality: N/A;   LAPAROSCOPIC RIGHT COLECTOMY Right 05/05/2020   Procedure: LAPAROSCOPIC RIGHT COLECTOMY;  Surgeon: Earline Mayotte, MD;  Location: [facility] ORS;  Service: General;  Laterality: Right;   lumpectomy (otheR)     MASTECTOMY     both   SENTINEL NODE BIOPSY Left  09/13/2016   Procedure: SENTINEL NODE BIOPSY;  Surgeon: Earline Mayotte, MD;  Location: [facility] ORS;  Service: General;  Laterality: Left;   SIMPLE MASTECTOMY WITH AXILLARY SENTINEL NODE BIOPSY Left 09/13/2016   Procedure: SIMPLE MASTECTOMY;  Surgeon: Earline Mayotte, MD;  Location: [facility] ORS;  Service: General;  Laterality: Left;    Prior to Admission medications   Medication Sig Start Date End Date Taking? Authorizing Provider  ALPRAZolam Prudy Feeler) 0.5 MG tablet Take 0.5 mg by mouth daily. 12/14/22   [provider]  aspirin EC 81  MG tablet Take 81 mg by mouth daily as needed (heart murmur). Swallow whole.    [provider]  glimepiride (AMARYL) 2 MG tablet Take 2 mg by mouth 2 (two) times daily. 02/15/23   [provider]  hydrochlorothiazide (MICROZIDE) 12.5 MG capsule Take by mouth. 07/17/20   [provider]  levothyroxine (SYNTHROID) 75 MCG tablet Take 1 tablet (75 mcg total) by mouth daily. 02/20/23   Mickie Bail, NP  linaclotide Karlene Einstein) 145 MCG CAPS capsule Take by mouth.    [provider]  pantoprazole (PROTONIX) 40 MG tablet Take 1 tablet (40 mg total) by mouth daily. 02/20/23   Mickie Bail, NP  pravastatin (PRAVACHOL) 40 MG tablet Take 40 mg by mouth at bedtime.    [provider]  trandolapril (MAVIK) 4 MG tablet Take 1 tablet (4 mg total) by mouth daily. 02/20/23   Mickie Bail, NP  traZODone (DESYREL) 50 MG tablet Take 50 mg by mouth at bedtime.     [provider]  verapamil (VERELAN PM) 240 MG 24 hr capsule Take 240 mg by mouth at bedtime.     [provider]    Family History  Problem Relation Age of Onset   Breast cancer Sister      Social History   Tobacco Use   Smoking status: Never   Smokeless tobacco: Never  Vaping Use   Vaping status: Never Used  Substance Use Topics   Alcohol use: No   Drug use: No    Allergies as of 07/11/2023   (No Known Allergies)    Review of Systems:    All systems reviewed and negative except where noted in HPI.   Physical Exam:  BP (!) 162/79 (BP Location: Right Arm, Patient Position: Sitting, Cuff Size: Normal)   Pulse (!) 56   Temp 97.7 F (36.5 C) (Oral)   Ht 5\' 3"  (1.6 m)   Wt 132 lb (59.9 kg)   BMI 23.38 kg/m  No LMP recorded. Patient has had a hysterectomy.  General:   Alert,  Well-developed, well-nourished, pleasant and cooperative in NAD Lungs:  Respirations even and unlabored.  Clear throughout to auscultation.   No wheezes, crackles, or rhonchi. No acute distress. Heart:   Regular rate and rhythm; no murmurs, clicks, rubs, or gallops. Abdomen:  Normal bowel sounds.  No bruits.  Soft, and non-distended without masses, hepatosplenomegaly or hernias noted.  Mild generalized abdominal tenderness throughout upper abdomen.  No lower abdominal tenderness..  No guarding or rebound tenderness.    Neurologic:  Alert and oriented x3;  grossly normal neurologically.  She walks with no assistive devices.  Is able to get on and off exam table with no help. Psych:  Alert and cooperative. Normal mood and affect.  Imaging Studies: No results found.  Assessment and Plan:   Tammy Cannon is a 83 y.o. y/o female has been referred for:  1.  Iron deficiency anemia  She will get CBC & Iron done through her PCP this week.  Scheduling EGD & Colonoscopy I discussed risks of EGD and colonoscopy with patient to include risk of bleeding, perforation, and risk of sedation.  Patient expressed understanding and agrees to proceed with procedures.    Consider capsule endoscopy if EGD and colonoscopy are unrevealing.  2.  History of Intramucosal adenocarcinoma in a colon polyp from the ascending colon removed in 2021.  S/p right hemicolectomy 04/2020.  3.  Chronic constipation  I advised patient to take MiraLAX every day.  Add OTC fiber Gummies daily.  4.  Hemorrhoids with rectal bleeding  Rx hydrocortisone 25 Mg suppository 1 into the rectum at bedtime for 2 weeks.  5.  Nausea with colonoscopy prep  Rx Zofran 4 mg every 4-6 hours as needed nausea, #20, no refills.  Follow up With Dr. Tobi Bastos after EGD / Colon.  Celso Amy, PA-C

## 2023-08-08 ENCOUNTER — Inpatient Hospital Stay: Payer: BC Managed Care – PPO

## 2023-08-08 ENCOUNTER — Telehealth: Payer: Self-pay | Admitting: *Deleted

## 2023-08-08 ENCOUNTER — Encounter: Payer: Self-pay | Admitting: Internal Medicine

## 2023-08-08 ENCOUNTER — Inpatient Hospital Stay: Payer: BC Managed Care – PPO | Attending: Internal Medicine | Admitting: Internal Medicine

## 2023-08-08 VITALS — BP 148/70 | HR 59 | Temp 97.8°F | Resp 16 | Wt 143.0 lb

## 2023-08-08 DIAGNOSIS — D509 Iron deficiency anemia, unspecified: Secondary | ICD-10-CM | POA: Diagnosis not present

## 2023-08-08 DIAGNOSIS — I129 Hypertensive chronic kidney disease with stage 1 through stage 4 chronic kidney disease, or unspecified chronic kidney disease: Secondary | ICD-10-CM | POA: Insufficient documentation

## 2023-08-08 DIAGNOSIS — Z853 Personal history of malignant neoplasm of breast: Secondary | ICD-10-CM | POA: Diagnosis not present

## 2023-08-08 DIAGNOSIS — D472 Monoclonal gammopathy: Secondary | ICD-10-CM | POA: Diagnosis not present

## 2023-08-08 DIAGNOSIS — N183 Chronic kidney disease, stage 3 unspecified: Secondary | ICD-10-CM | POA: Diagnosis not present

## 2023-08-08 NOTE — Telephone Encounter (Signed)
 Daughter called and said mom is coming today at 2:30  for labs and then see you. She had labs at [facility] 07/11/23. She wants to know if she can just look at the [facility] ones and not have to get labs. I spoke to Idamay and she is ok and will review the labs at [facility] today. I

## 2023-08-08 NOTE — Progress Notes (Signed)
 [facility]  [phone] [phone]   ID: Tammy Cannon OB: 1941-03-20  MR#: [id number]  AOZ#:[id number]  Patient Care Team: Mick Sell, MD as PCP - General (Infectious Diseases) Darrin Nipper, MD as Referring Physician (Obstetrics and Gynecology) Michaelyn Barter, MD as Consulting Physician (Oncology)  Reason for visit-iron deficiency anemia, IgG lambda MGUS  HPI: Tammy Cannon is a 83 y.o. female with past medical history of right breast cancer status postmastectomy in 1989, DCIS of left breast, GERD, hypothyroidism, colon cancer status post right hemicolectomy in December 2021 was referred to hematology for workup of anemia.  Patient has history of mild anemia since 2021.  Ranging between 10-11.  MCV normal.  WBC and platelets normal.  Ferritin 11 from October 2024.  SPEP showed M protein of 1.1.  IFE not available.From 04/04/2023 hemoglobin 10.1 creatinine 1.3, EGFR 41.  Patient has been feeling more sleepy and fatigued for the past few months.  Feeling dizzy.  Was previously on oral iron which she stopped due to constipation.  She is on daily vitamin B12.  Reports neuropathy in left hand and bilateral feet due to diabetes.  She has history of colon cancer status post right hemicolectomy in December 2021.  Showed 2.1 x 0.9 x 0.4 cm moderately differentiated adenocarcinoma.  0/25 lymph node negative for malignancy.  Invades submucosa.  Margins negative.  pT1 N0c M0.  She was seen by Dr. Smith Robert.  Did not need any adjuvant chemotherapy. Colonoscopy from December 2022 by Dr. Donnalee Curry was normal.  Repeat recommended in 3 years.  Completed IV Venofer in January 2025.  Was seen by Dr. Tobi Bastos, GI and will be planned for endoscopy and colonoscopy.  Interval history Patient seen today as follow-up accompanied by daughter. Patient reports feeling better after iron infusions.  Her energy has significantly improved.  Denies any bleeding in urine  or stools.  REVIEW OF SYSTEMS:   ROS  As per HPI. Otherwise, a complete review of systems is negative.  PAST MEDICAL HISTORY: Past Medical History:  Diagnosis Date   Anemia    Breast cancer (HCC) 1989   Right mastectomy /pos node /chemo /Dr Cerame   Cardiomegaly    Colon polyp    Complication of anesthesia    with colonoscopy   Coronary atherosclerosis of native coronary artery    COVID-19    DCIS (ductal carcinoma in situ) 08/19/2016   left breast   Diabetes mellitus without complication (HCC)    Dyspnea    GERD (gastroesophageal reflux disease)    Heart murmur    History of kidney stones    Hypothyroidism    Other and unspecified hyperlipidemia    Other chronic pulmonary heart diseases    Panic attacks    Pneumonia    covid PNA   PONV (postoperative nausea and vomiting)    Unspecified essential hypertension     PAST SURGICAL HISTORY: Past Surgical History:  Procedure Laterality Date   ABDOMINAL HYSTERECTOMY     BREAST BIOPSY Left 08/19/2016   DUCTAL CARCINOMA IN SITU (DCIS) INVOLVING A SCLEROSING LESION   CHOLECYSTECTOMY     COLONOSCOPY WITH PROPOFOL N/A 04/03/2020   Procedure: COLONOSCOPY WITH PROPOFOL;  Surgeon: Wyline Mood, MD;  Location: [facility] ENDOSCOPY;  Service: Gastroenterology;  Laterality: N/A;   COLONOSCOPY WITH PROPOFOL N/A 05/13/2021   Procedure: COLONOSCOPY WITH PROPOFOL;  Surgeon: Earline Mayotte, MD;  Location: [facility] ENDOSCOPY;  Service: Endoscopy;  Laterality: N/A;   LAPAROSCOPIC RIGHT COLECTOMY Right 05/05/2020  Procedure: LAPAROSCOPIC RIGHT COLECTOMY;  Surgeon: Earline Mayotte, MD;  Location: [facility] ORS;  Service: General;  Laterality: Right;   lumpectomy (otheR)     MASTECTOMY     both   SENTINEL NODE BIOPSY Left 09/13/2016   Procedure: SENTINEL NODE BIOPSY;  Surgeon: Earline Mayotte, MD;  Location: [facility] ORS;  Service: General;  Laterality: Left;   SIMPLE MASTECTOMY WITH AXILLARY SENTINEL NODE BIOPSY Left 09/13/2016   Procedure: SIMPLE  MASTECTOMY;  Surgeon: Earline Mayotte, MD;  Location: [facility] ORS;  Service: General;  Laterality: Left;    FAMILY HISTORY: Family History  Problem Relation Age of Onset   Breast cancer Sister     HEALTH MAINTENANCE: Social History   Tobacco Use   Smoking status: Never   Smokeless tobacco: Never  Vaping Use   Vaping status: Never Used  Substance Use Topics   Alcohol use: No   Drug use: No     No Known Allergies  Current Outpatient Medications  Medication Sig Dispense Refill   ALPRAZolam (XANAX) 0.5 MG tablet Take 0.5 mg by mouth daily.     aspirin EC 81 MG tablet Take 81 mg by mouth daily as needed (heart murmur). Swallow whole.     glimepiride (AMARYL) 2 MG tablet Take 2 mg by mouth 2 (two) times daily.     hydrochlorothiazide (MICROZIDE) 12.5 MG capsule Take by mouth.     levothyroxine (SYNTHROID) 75 MCG tablet Take 1 tablet (75 mcg total) by mouth daily. 30 tablet 0   linaclotide (LINZESS) 145 MCG CAPS capsule Take by mouth.     ondansetron (ZOFRAN) 4 MG tablet Take 1 tablet (4 mg total) by mouth every 8 (eight) hours as needed for nausea or vomiting. 30 tablet 0   pantoprazole (PROTONIX) 40 MG tablet Take 1 tablet (40 mg total) by mouth daily. 30 tablet 0   PARoxetine (PAXIL) 10 MG tablet Take 10 mg by mouth daily.     pravastatin (PRAVACHOL) 40 MG tablet Take 40 mg by mouth at bedtime.     trandolapril (MAVIK) 4 MG tablet Take 1 tablet (4 mg total) by mouth daily. 30 tablet 0   traZODone (DESYREL) 50 MG tablet Take 50 mg by mouth at bedtime.      verapamil (VERELAN PM) 240 MG 24 hr capsule Take 240 mg by mouth at bedtime.      No current facility-administered medications for this visit.    OBJECTIVE: Vitals:   08/08/23 1508  BP: (!) 148/70  Pulse: (!) 59  Resp: 16  Temp: 97.8 F (36.6 C)  SpO2: 98%      Body mass index is 25.33 kg/m.      Physical exam - not performed.    LAB RESULTS:  Lab Results  Component Value Date   NA 138 02/16/2023   K 3.9  02/16/2023   CL 102 02/16/2023   CO2 23 02/16/2023   GLUCOSE 224 (H) 02/16/2023   BUN 23 02/16/2023   CREATININE 1.16 (H) 02/16/2023   CALCIUM 9.3 02/16/2023   PROT 8.3 (H) 02/16/2023   ALBUMIN 4.3 02/16/2023   AST 25 02/16/2023   ALT 20 02/16/2023   ALKPHOS 90 02/16/2023   BILITOT 0.8 02/16/2023   GFRNONAA 47 (L) 02/16/2023   GFRAA 53 (L) 07/14/2019    Lab Results  Component Value Date   WBC 6.3 04/22/2023   NEUTROABS 3.4 04/22/2023   HGB 9.6 (L) 04/22/2023   HCT 32.5 (L) 04/22/2023   MCV 84.6 04/22/2023  PLT 251 04/22/2023    Lab Results  Component Value Date   TIBC 552 (H) 04/22/2023   TIBC 493 (H) 04/07/2020   FERRITIN 9 (L) 04/22/2023   FERRITIN 8 (L) 04/07/2020   IRONPCTSAT 7 (L) 04/22/2023   IRONPCTSAT 7 (L) 04/07/2020     STUDIES: No results found.  ASSESSMENT AND PLAN:   Tammy Cannon is a 83 y.o. female with pmh of right breast cancer status postmastectomy in 1989, DCIS of left breast in 2018, GERD, hypothyroidism, colon cancer status post right hemicolectomy in December 2021 was referred to hematology for workup of anemia.  # Iron deficiency anemia # CKD stage III -Hemoglobin ranges between 10-11.  Cannot tolerate oral iron due to constipation.  -Patient had labs done with PCP on 07/11/2023.  Hemoglobin has improved from 9.5-12.7.  Ferritin 100 saturation 20%.  -Scheduled for colonoscopy and endoscopy on 08/22/2023 with Dr. Tobi Bastos. Last colonoscopy was in 2022 by Dr. Lemar Livings which was unremarkable.   # IgG lambda MGUS, low risk -SPEP/IFE 1.2 g/dL IgG lambda protein. Kappa 31.5.  Lambda 103.7.  Kappa lambda ratio of 0.30. -She has chronic kidney disease stage III since 2020.  Her creatinine is overall stable.  Likely secondary to hypertension and diabetes. -Will repeat myeloma labs next visit.  Then monitor once a year.  # History of colon cancer - She has history of colon cancer status post right hemicolectomy in December 2021.  Showed 2.1 x 0.9 x 0.4  cm moderately differentiated adenocarcinoma.  0/25 lymph node negative for malignancy.  Invades submucosa.  Margins negative.  pT1 N0c M0.  She was seen by Dr. Smith Robert. Did not need any adjuvant chemotherapy.   Orders Placed This Encounter  Procedures   CBC with Differential (Cancer Center Only)   Ferritin   Iron and TIBC([facility])   Multiple Myeloma Panel (SPEP&IFE w/QIG)   Kappa/lambda light chains   Comprehensive metabolic panel   RTC in 6 months for MD visit, labs, Venofer.  Labs 2 weeks prior.  Patient expressed understanding and was in agreement with this plan. She also understands that She can call clinic at any time with any questions, concerns, or complaints.   I spent a total of 30 minutes reviewing chart data, face-to-face evaluation with the patient, counseling and coordination of care as detailed above.  Michaelyn Barter, MD   08/08/2023 3:29 PM

## 2023-08-08 NOTE — Progress Notes (Signed)
 Patient states that the iron infusions that she has had already seemed to help her a lot, especially with her fatigue, which now it is starting to come back. She has been having some constipation, she does take her Murelax sometimes which will make her go like instantly.

## 2023-08-15 ENCOUNTER — Encounter: Payer: Self-pay | Admitting: Gastroenterology

## 2023-08-22 ENCOUNTER — Ambulatory Visit
Admission: RE | Admit: 2023-08-22 | Discharge: 2023-08-22 | Disposition: A | Discharge status: Home / Self Care | Payer: BC Managed Care – PPO | Attending: Gastroenterology | Admitting: Gastroenterology

## 2023-08-22 ENCOUNTER — Other Ambulatory Visit: Payer: Self-pay

## 2023-08-22 ENCOUNTER — Ambulatory Visit: Admitting: General Practice

## 2023-08-22 ENCOUNTER — Encounter: Payer: Self-pay | Admitting: Gastroenterology

## 2023-08-22 ENCOUNTER — Encounter
Admission: RE | Disposition: A | Discharge status: Home / Self Care | Payer: Self-pay | Source: Home / Self Care | Attending: Gastroenterology

## 2023-08-22 DIAGNOSIS — K449 Diaphragmatic hernia without obstruction or gangrene: Secondary | ICD-10-CM | POA: Diagnosis not present

## 2023-08-22 DIAGNOSIS — Z980 Intestinal bypass and anastomosis status: Secondary | ICD-10-CM | POA: Insufficient documentation

## 2023-08-22 DIAGNOSIS — I129 Hypertensive chronic kidney disease with stage 1 through stage 4 chronic kidney disease, or unspecified chronic kidney disease: Secondary | ICD-10-CM | POA: Diagnosis not present

## 2023-08-22 DIAGNOSIS — N183 Chronic kidney disease, stage 3 unspecified: Secondary | ICD-10-CM | POA: Diagnosis not present

## 2023-08-22 DIAGNOSIS — Z85038 Personal history of other malignant neoplasm of large intestine: Secondary | ICD-10-CM

## 2023-08-22 DIAGNOSIS — D509 Iron deficiency anemia, unspecified: Secondary | ICD-10-CM | POA: Diagnosis not present

## 2023-08-22 DIAGNOSIS — E1122 Type 2 diabetes mellitus with diabetic chronic kidney disease: Secondary | ICD-10-CM | POA: Diagnosis not present

## 2023-08-22 DIAGNOSIS — Z800 Family history of malignant neoplasm of digestive organs: Secondary | ICD-10-CM | POA: Diagnosis not present

## 2023-08-22 DIAGNOSIS — Z7984 Long term (current) use of oral hypoglycemic drugs: Secondary | ICD-10-CM | POA: Diagnosis not present

## 2023-08-22 DIAGNOSIS — I2510 Atherosclerotic heart disease of native coronary artery without angina pectoris: Secondary | ICD-10-CM | POA: Diagnosis not present

## 2023-08-22 LAB — GLUCOSE, CAPILLARY: Glucose-Capillary: 120 mg/dL — ABNORMAL HIGH (ref 70–99)

## 2023-08-22 SURGERY — COLONOSCOPY WITH PROPOFOL
Anesthesia: General

## 2023-08-22 MED ORDER — PROPOFOL 10 MG/ML IV BOLUS
INTRAVENOUS | Status: DC | PRN
  Administered 2023-08-22: 50 mg via INTRAVENOUS
  Administered 2023-08-22 (×2): 20 mg via INTRAVENOUS

## 2023-08-22 MED ORDER — LIDOCAINE HCL (CARDIAC) PF 100 MG/5ML IV SOSY
PREFILLED_SYRINGE | INTRAVENOUS | Status: DC | PRN
  Administered 2023-08-22: 60 mg via INTRAVENOUS

## 2023-08-22 MED ORDER — PROPOFOL 500 MG/50ML IV EMUL
INTRAVENOUS | Status: DC | PRN
  Administered 2023-08-22: 50 ug/kg/min via INTRAVENOUS

## 2023-08-22 MED ORDER — SODIUM CHLORIDE 0.9 % IV SOLN: INTRAVENOUS | Status: DC

## 2023-08-22 MED ORDER — GLYCOPYRROLATE 0.2 MG/ML IJ SOLN
INTRAMUSCULAR | Status: DC | PRN
  Administered 2023-08-22: .2 mg via INTRAVENOUS

## 2023-08-22 MED ORDER — LIDOCAINE HCL (PF) 2 % IJ SOLN
INTRAMUSCULAR | Status: AC
  Filled 2023-08-22: qty 5

## 2023-08-22 MED ORDER — GLYCOPYRROLATE 0.2 MG/ML IJ SOLN
INTRAMUSCULAR | Status: AC
  Filled 2023-08-22: qty 1

## 2023-08-22 MED ORDER — PROPOFOL 1000 MG/100ML IV EMUL
INTRAVENOUS | Status: AC
Start: 2023-08-22 — End: ?
  Filled 2023-08-22: qty 100

## 2023-08-22 NOTE — Op Note (Signed)
 [facility] Gastroenterology Patient Name: Tammy Cannon Procedure Date: 08/22/2023 9:19 AM MRN: [id number] Account #: [id number] Date of Birth: August 19, 1940 Admit Type: Outpatient Age: 83 Room: [facility] ENDO ROOM 1 Gender: Female Note Status: Finalized Instrument Name: Prentice Docker [id number] Procedure:             Colonoscopy Indications:           Iron deficiency anemia Providers:             Wyline Mood MD, MD Referring MD:          No Local Md, MD (Referring MD) Medicines:             Monitored Anesthesia Care Complications:         No immediate complications. Procedure:             Pre-Anesthesia Assessment:                        - Prior to the procedure, a History and Physical was                         performed, and patient medications, allergies and                         sensitivities were reviewed. The patient's tolerance                         of previous anesthesia was reviewed.                        - The risks and benefits of the procedure and the                         sedation options and risks were discussed with the                         patient. All questions were answered and informed                         consent was obtained.                        - ASA Grade Assessment: II - A patient with mild                         systemic disease.                        After obtaining informed consent, the colonoscope was                         passed under direct vision. Throughout the procedure,                         the patient's blood pressure, pulse, and oxygen                         saturations were monitored continuously. The                         Colonoscope was introduced through  the anus and                         advanced to the the ileocolonic anastomosis. The                         colonoscopy was performed with ease. The patient                         tolerated the procedure well. The quality of the bowel                          preparation was good. Findings:      The perianal and digital rectal examinations were normal.      There was evidence of a prior end-to-end colo-colonic anastomosis in the       ascending colon. This was characterized by healthy appearing mucosa.      The exam was otherwise without abnormality on direct and retroflexion       views. Impression:            - End-to-end colo-colonic anastomosis, characterized                         by healthy appearing mucosa.                        - The examination was otherwise normal on direct and                         retroflexion views.                        - No specimens collected. Recommendation:        - Discharge patient to home (with escort).                        - Resume previous diet.                        - Continue present medications.                        - Return to my office as previously scheduled. Procedure Code(s):     --- Professional ---                        [phone], Colonoscopy, flexible; diagnostic, including                         collection of specimen(s) by brushing or washing, when                         performed (separate procedure) Diagnosis Code(s):     --- Professional ---                        Z98.0, Intestinal bypass and anastomosis status                        D50.9, Iron deficiency anemia, unspecified CPT copyright 2022 American Medical Association. All rights reserved. The codes documented in this report  are preliminary and upon coder review may  be revised to meet current compliance requirements. Wyline Mood, MD Wyline Mood MD, MD 08/22/2023 9:57:58 AM This report has been signed electronically. Number of Addenda: 0 Note Initiated On: 08/22/2023 9:19 AM Total Procedure Duration: 0 hours 11 minutes 40 seconds  Estimated Blood Loss:  Estimated blood loss: none.      [facility]

## 2023-08-22 NOTE — Op Note (Signed)
 [facility] Gastroenterology Patient Name: Tammy Cannon Procedure Date: 08/22/2023 10:02 AM MRN: [id number] Account #: [id number] Date of Birth: [birth year] Admit Type: Outpatient Age: 83 Room: 1 Gender: Female Note Status: Finalized Instrument Name: Upper Endoscope [id number] Procedure:             Upper GI endoscopy Indications:           Iron deficiency anemia Providers:             Wyline Mood MD, MD Referring MD:          No Local Md, MD (Referring MD) Medicines:             Monitored Anesthesia Care Complications:         No immediate complications. Procedure:             Pre-Anesthesia Assessment:                        - Prior to the procedure, a History and Physical was                         performed, and patient medications, allergies and                         sensitivities were reviewed. The patient's tolerance                         of previous anesthesia was reviewed.                        - The risks and benefits of the procedure and the                         sedation options and risks were discussed with the                         patient. All questions were answered and informed                         consent was obtained.                        - ASA Grade Assessment: II - A patient with mild                         systemic disease.                        After obtaining informed consent, the endoscope was                         passed under direct vision. Throughout the procedure,                         the patient's blood pressure, pulse, and oxygen                         saturations were monitored continuously. The Endoscope                         was introduced through  the mouth, and advanced to the                         third part of duodenum. The upper GI endoscopy was                         accomplished with ease. The patient tolerated the                         procedure well. Findings:      The esophagus was normal.       The examined duodenum was normal.      A medium-sized hiatal hernia was present. Impression:            - Normal esophagus.                        - Normal examined duodenum.                        - Medium-sized hiatal hernia.                        - No specimens collected. Recommendation:        - Return to my office as previously scheduled. Procedure Code(s):     --- Professional ---                        [phone], Esophagogastroduodenoscopy, flexible,                         transoral; diagnostic, including collection of                         specimen(s) by brushing or washing, when performed                         (separate procedure) Diagnosis Code(s):     --- Professional ---                        K44.9, Diaphragmatic hernia without obstruction or                         gangrene                        D50.9, Iron deficiency anemia, unspecified CPT copyright 2022 American Medical Association. All rights reserved. The codes documented in this report are preliminary and upon coder review may  be revised to meet current compliance requirements. Wyline Mood, MD Wyline Mood MD, MD 08/22/2023 10:04:17 AM This report has been signed electronically. Number of Addenda: 0 Note Initiated On: 08/22/2023 10:02 AM Estimated Blood Loss:  Estimated blood loss: none.      [facility]

## 2023-08-22 NOTE — Anesthesia Postprocedure Evaluation (Signed)
 Anesthesia Post Note  Patient: Tammy Cannon  Procedure(s) Performed: COLONOSCOPY WITH PROPOFOL EGD (ESOPHAGOGASTRODUODENOSCOPY)  Patient location during evaluation: Endoscopy Anesthesia Type: General Level of consciousness: awake and alert Pain management: pain level controlled Vital Signs Assessment: post-procedure vital signs reviewed and stable Respiratory status: spontaneous breathing, nonlabored ventilation, respiratory function stable and patient connected to nasal cannula oxygen Cardiovascular status: blood pressure returned to baseline and stable Postop Assessment: no apparent nausea or vomiting Anesthetic complications: no  No notable events documented.   Last Vitals:  Vitals:   08/22/23 1014 08/22/23 1021  BP: 122/66 (!) 141/69  Pulse:    Resp:    Temp:    SpO2:      Last Pain:  Vitals:   08/22/23 1021  TempSrc:   PainSc: 0-No pain                 Stephanie Coup

## 2023-08-22 NOTE — H&P (Signed)
 Wyline Mood, MD [address], [place], [place], [address] [address], [address], [place], [place], [address] Phone: [phone]  Fax: [phone]  Primary Care Physician:  Mick Sell, MD   Pre-Procedure History & Physical: HPI:  Tammy Cannon is a 83 y.o. female is here for an endoscopy and colonoscopy    Past Medical History:  Diagnosis Date   Anemia    Breast cancer (HCC) 1989   Right mastectomy Bartolo Darter node Tawanna Cooler /Dr Cerame   Cardiomegaly    Colon polyp    Complication of anesthesia    with colonoscopy   Coronary atherosclerosis of native coronary artery    COVID-19    DCIS (ductal carcinoma in situ) 08/19/2016   left breast   Diabetes mellitus without complication (HCC)    Dyspnea    GERD (gastroesophageal reflux disease)    Heart murmur    History of kidney stones    Hypothyroidism    Other and unspecified hyperlipidemia    Other chronic pulmonary heart diseases    Panic attacks    Pneumonia    covid PNA   PONV (postoperative nausea and vomiting)    Unspecified essential hypertension     Past Surgical History:  Procedure Laterality Date   ABDOMINAL HYSTERECTOMY     BREAST BIOPSY Left 08/19/2016   DUCTAL CARCINOMA IN SITU (DCIS) INVOLVING A SCLEROSING LESION   CHOLECYSTECTOMY     COLONOSCOPY WITH PROPOFOL N/A 04/03/2020   Procedure: COLONOSCOPY WITH PROPOFOL;  Surgeon: Wyline Mood, MD;  Location: [facility] ENDOSCOPY;  Service: Gastroenterology;  Laterality: N/A;   COLONOSCOPY WITH PROPOFOL N/A 05/13/2021   Procedure: COLONOSCOPY WITH PROPOFOL;  Surgeon: Earline Mayotte, MD;  Location: [facility] ENDOSCOPY;  Service: Endoscopy;  Laterality: N/A;   LAPAROSCOPIC RIGHT COLECTOMY Right 05/05/2020   Procedure: LAPAROSCOPIC RIGHT COLECTOMY;  Surgeon: Earline Mayotte, MD;  Location: [facility] ORS;  Service: General;  Laterality: Right;   lumpectomy (otheR)     MASTECTOMY     both   SENTINEL NODE BIOPSY Left 09/13/2016   Procedure: SENTINEL NODE  BIOPSY;  Surgeon: Earline Mayotte, MD;  Location: [facility] ORS;  Service: General;  Laterality: Left;   SIMPLE MASTECTOMY WITH AXILLARY SENTINEL NODE BIOPSY Left 09/13/2016   Procedure: SIMPLE MASTECTOMY;  Surgeon: Earline Mayotte, MD;  Location: [facility] ORS;  Service: General;  Laterality: Left;    Prior to Admission medications   Medication Sig Start Date End Date Taking? Authorizing Provider  ALPRAZolam Prudy Feeler) 0.5 MG tablet Take 0.5 mg by mouth daily. 12/14/22   [provider]  aspirin EC 81 MG tablet Take 81 mg by mouth daily as needed (heart murmur). Swallow whole.    [provider]  glimepiride (AMARYL) 2 MG tablet Take 2 mg by mouth 2 (two) times daily. 02/15/23   [provider]  hydrochlorothiazide (MICROZIDE) 12.5 MG capsule Take by mouth. 07/17/20   [provider]  levothyroxine (SYNTHROID) 75 MCG tablet Take 1 tablet (75 mcg total) by mouth daily. 02/20/23   Mickie Bail, NP  linaclotide Karlene Einstein) 145 MCG CAPS capsule Take by mouth.    [provider]  ondansetron (ZOFRAN) 4 MG tablet Take 1 tablet (4 mg total) by mouth every 8 (eight) hours as needed for nausea or vomiting. 07/11/23 10/09/23  Celso Amy, PA-C  pantoprazole (PROTONIX) 40 MG tablet Take 1 tablet (40 mg total) by mouth daily. 02/20/23   Mickie Bail, NP  PARoxetine (PAXIL) 10 MG  tablet Take 10 mg by mouth daily.    [provider]  pravastatin (PRAVACHOL) 40 MG tablet Take 40 mg by mouth at bedtime.    [provider]  trandolapril (MAVIK) 4 MG tablet Take 1 tablet (4 mg total) by mouth daily. 02/20/23   Mickie Bail, NP  traZODone (DESYREL) 50 MG tablet Take 50 mg by mouth at bedtime.     [provider]  verapamil (VERELAN PM) 240 MG 24 hr capsule Take 240 mg by mouth at bedtime.     [provider]    Allergies as of 07/11/2023   (No Known Allergies)    Family History  Problem Relation Age of Onset   Breast cancer Sister      Social History   Socioeconomic History   Marital status: Widowed    Spouse name: Not on file   Number of children: Not on file   Years of education: Not on file   Highest education level: Not on file  Occupational History   Not on file  Tobacco Use   Smoking status: Never   Smokeless tobacco: Never  Vaping Use   Vaping status: Never Used  Substance and Sexual Activity   Alcohol use: No   Drug use: No   Sexual activity: Not on file  Other Topics Concern   Not on file  Social History Narrative   Full time. Does not regularly exercises       Social Drivers of Health   Financial Resource Strain: Low Risk  (07/11/2023)   Received from [facility]   Overall Financial Resource Strain (CARDIA)    Difficulty of Paying Living Expenses: Not hard at all  Food Insecurity: No Food Insecurity (07/11/2023)   Received from [facility]   Hunger Vital Sign    Worried About Running Out of Food in the Last Year: Never true    Ran Out of Food in the Last Year: Never true  Transportation Needs: No Transportation Needs (07/11/2023)   Received from [facility] - Transportation    In the past 12 months, has lack of transportation kept you from medical appointments or from getting medications?: No    Lack of Transportation (Non-Medical): No  Physical Activity: Not on file  Stress: Not on file  Social Connections: Not on file  Intimate Partner Violence: Not At Risk (04/22/2023)   Humiliation, Afraid, Rape, and Kick questionnaire    Fear of Current or Ex-Partner: No    Emotionally Abused: No    Physically Abused: No    Sexually Abused: No    Review of Systems: See HPI, otherwise negative ROS  Physical Exam: There were no vitals taken for this visit. General:   Alert,  pleasant and cooperative in NAD Head:  Normocephalic and atraumatic. Neck:  Supple; no masses or thyromegaly. Lungs:  Clear throughout to auscultation,  normal respiratory effort.    Heart:  +S1, +S2, Regular rate and rhythm, No edema. Abdomen:  Soft, nontender and nondistended. Normal bowel sounds, without guarding, and without rebound.   Neurologic:  Alert and  oriented x4;  grossly normal neurologically.  Impression/Plan: Tammy Cannon is here for an endoscopy and colonoscopy  to be performed for  evaluation of iron deficiency anemia    Risks, benefits, limitations, and alternatives regarding endoscopy have been reviewed with the patient.  Questions have been answered.  All parties agreeable.   Wyline Mood, MD  08/22/2023, 8:56  AM

## 2023-08-22 NOTE — Transfer of Care (Signed)
 Immediate Anesthesia Transfer of Care Note  Patient: Tammy Cannon  Procedure(s) Performed: COLONOSCOPY WITH PROPOFOL EGD (ESOPHAGOGASTRODUODENOSCOPY)  Patient Location: PACU  Anesthesia Type:General  Level of Consciousness: sedated  Airway & Oxygen Therapy: Patient Spontanous Breathing  Post-op Assessment: Report given to RN and Post -op Vital signs reviewed and stable  Post vital signs: Reviewed and stable  Last Vitals:  Vitals Value Taken Time  BP    Temp    Pulse    Resp    SpO2      Last Pain:  Vitals:   08/22/23 0920  TempSrc: Temporal  PainSc: 0-No pain         Complications: No notable events documented.

## 2023-08-22 NOTE — Anesthesia Preprocedure Evaluation (Signed)
 Anesthesia Evaluation  Patient identified by MRN, date of birth, ID band Patient awake    Reviewed: Allergy & Precautions, H&P , NPO status , Patient's Chart, lab work & pertinent test results  History of Anesthesia Complications (+) PONVNegative for: history of anesthetic complications  Airway Mallampati: II  TM Distance: <3 FB     Dental no notable dental hx.    Pulmonary shortness of breath and with exertion, pneumonia, resolved   Pulmonary exam normal        Cardiovascular Exercise Tolerance: Good hypertension, Pt. on medications (-) angina + CAD  (-) Past MI and (-) Cardiac Stents Normal cardiovascular exam(-) dysrhythmias + Valvular Problems/Murmurs  Rhythm:regular Rate:Normal     Neuro/Psych  PSYCHIATRIC DISORDERS Anxiety     negative neurological ROS     GI/Hepatic Neg liver ROS, Bowel prep,GERD  Controlled and Medicated,,  Endo/Other  diabetes, Well Controlled, Type 2, Oral Hypoglycemic AgentsHypothyroidism    Renal/GU      Musculoskeletal   Abdominal   Peds  Hematology negative hematology ROS (+) Blood dyscrasia, anemia   Anesthesia Other Findings Anemia    Breast cancer (HCC) 1989 Right mastectomy /pos node Tawanna Cooler /Dr Cerame  Cardiomegaly    Colon polyp    Complication of anesthesia  with colonoscopy  Coronary atherosclerosis of native coronary artery    COVID-19    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  Diabetes mellitus without complication (HCC)    Dyspnea    GERD (gastroesophageal reflux disease) Heart murmur    History of kidney stones   Hypothyroidism    Other and unspecified hyperlipidemia Other chronic pulmonary heart diseases Panic attacks    Pneumonia  covid PNA  PONV (postoperative nausea and vomiting)    Unspecified essential hypertension       Reproductive/Obstetrics negative OB ROS                             Anesthesia  Physical Anesthesia Plan  ASA: 3  Anesthesia Plan: General   Post-op Pain Management: Minimal or no pain anticipated   Induction: Intravenous  PONV Risk Score and Plan: 3 and TIVA and Propofol infusion  Airway Management Planned: Natural Airway and Nasal Cannula  Additional Equipment: None  Intra-op Plan:   Post-operative Plan:   Informed Consent: I have reviewed the patients History and Physical, chart, labs and discussed the procedure including the risks, benefits and alternatives for the proposed anesthesia with the patient or authorized representative who has indicated his/her understanding and acceptance.     Dental Advisory Given  Plan Discussed with: Anesthesiologist, CRNA and Surgeon  Anesthesia Plan Comments: (Discussed risks of anesthesia with patient, including possibility of difficulty with spontaneous ventilation under anesthesia necessitating airway intervention, PONV, and rare risks such as cardiac or respiratory or neurological events, and allergic reactions. Discussed the role of CRNA in patient's perioperative care. Patient understands.)       Anesthesia Quick Evaluation

## 2023-08-23 ENCOUNTER — Encounter: Payer: Self-pay | Admitting: Gastroenterology

## 2023-08-24 ENCOUNTER — Telehealth: Payer: Self-pay

## 2023-08-24 NOTE — Telephone Encounter (Signed)
 Patient's daughter-Tammy Cannon called wanting to know if her mother needs a follow up appointment with Dr. Tobi Bastos since she was told to come back after EGD and Colonoscopy. I then looked into her reports and it stated to follow up as scheduled. Dr. Tobi Bastos, can you please let me know if she needs a follow up? Thank you.

## 2023-08-24 NOTE — Telephone Encounter (Signed)
 Called Vickey back and let her know what Dr. Tobi Bastos stated and she agreed. I let her know that I will place her mom in our recall list to remind her of her 3 month follow up. She understood and had no further questions.

## 2023-09-12 DIAGNOSIS — I10 Essential (primary) hypertension: Secondary | ICD-10-CM | POA: Diagnosis not present

## 2023-09-12 DIAGNOSIS — E782 Mixed hyperlipidemia: Secondary | ICD-10-CM | POA: Diagnosis not present

## 2023-09-12 DIAGNOSIS — N1832 Chronic kidney disease, stage 3b: Secondary | ICD-10-CM | POA: Diagnosis not present

## 2023-09-12 DIAGNOSIS — I517 Cardiomegaly: Secondary | ICD-10-CM | POA: Diagnosis not present

## 2023-09-12 DIAGNOSIS — D472 Monoclonal gammopathy: Secondary | ICD-10-CM | POA: Diagnosis not present

## 2023-09-12 DIAGNOSIS — E119 Type 2 diabetes mellitus without complications: Secondary | ICD-10-CM | POA: Diagnosis not present

## 2023-09-12 DIAGNOSIS — C44719 Basal cell carcinoma of skin of left lower limb, including hip: Secondary | ICD-10-CM | POA: Diagnosis not present

## 2023-09-19 DIAGNOSIS — D485 Neoplasm of uncertain behavior of skin: Secondary | ICD-10-CM | POA: Diagnosis not present

## 2023-09-19 DIAGNOSIS — Z48817 Encounter for surgical aftercare following surgery on the skin and subcutaneous tissue: Secondary | ICD-10-CM | POA: Diagnosis not present

## 2023-12-02 DIAGNOSIS — M25562 Pain in left knee: Secondary | ICD-10-CM | POA: Diagnosis not present

## 2023-12-02 DIAGNOSIS — G8929 Other chronic pain: Secondary | ICD-10-CM | POA: Diagnosis not present

## 2023-12-02 DIAGNOSIS — E119 Type 2 diabetes mellitus without complications: Secondary | ICD-10-CM | POA: Diagnosis not present

## 2023-12-02 DIAGNOSIS — M79672 Pain in left foot: Secondary | ICD-10-CM | POA: Diagnosis not present

## 2023-12-02 NOTE — Progress Notes (Signed)
 No chief complaint on file.   HPI  Tammy Cannon is a 83 y.o. here for an acute issue.  She has a PMH of HTN, HLD, DM2, GERD, hypothyroidism,, kidney stones, neuropathy who reports left foot pain after dropping an object 2 days ago on the toes, 1st and 2nd.  Slightly better today.  Has had some swelling.  Also complains of chronic left knee pain with intermittent swelling.  Still remains active.  Works 10-hour days.    ROS  Pertinent items are noted in HPI.  Outpatient Encounter Medications as of 12/02/2023  Medication Sig Dispense Refill  . aspirin  81 mg Cap Take by mouth    . cyanocobalamin  (VITAMIN B12) 1000 MCG tablet Take 1 tablet (1,000 mcg total) by mouth once daily 30 tablet 11  . glimepiride (AMARYL) 1 MG tablet Take 1 tablet (1 mg total) by mouth 2 (two) times daily 60 tablet 11  . hydroCHLOROthiazide (HYDRODIURIL) 12.5 MG tablet Take 1 tablet (12.5 mg total) by mouth once daily 30 tablet 11  . levothyroxine  (SYNTHROID ) 75 MCG tablet TAKE ONE TABLET (75 MCG TOTAL) BY MOUTH DAILY. 100 tablet 0  . pantoprazole  (PROTONIX ) 40 MG DR tablet TAKE ONE TABLET (40 MG TOTAL) BY MOUTH ONCE DAILY 30 tablet 5  . pravastatin (PRAVACHOL) 40 MG tablet TAKE ONE TABLET BY MOUTH ONCE A DAY 100 tablet 1  . trandolapriL  (MAVIK ) 4 MG tablet TAKE ONE TABLET (4 MG TOTAL) BY MOUTH DAILY. 100 tablet 0  . verapamiL  (CALAN -SR) 240 MG SR tablet TAKE ONE TABLET BY MOUTH ONCE A DAY 90 tablet 1   No facility-administered encounter medications on file as of 12/02/2023.    Allergies as of 12/02/2023  . (No Known Allergies)    Past Medical History:  Diagnosis Date  . Breast cancer (CMS/HHS-HCC) 08/19/2016   DCIS, simple mastectomy with sentinel node.  DCIS in a complex sclerosing lesion.  . Breast cancer (CMS/HHS-HCC) 1989   Node positive by report, records not available.  Mastectomy.  . Cardiomegaly   . Colon cancer (CMS/HHS-HCC) 05/05/2020   Moderately differentiated invasive adenocarcinoma, 2.1 cm with  additional foci of intramucosal adenocarcinoma.  Origin in a sessile serrated polyp.  0/25 nodes.  T1, N0  . Complication of anesthesia   . Coronary atherosclerosis of native coronary artery   . COVID-19   . Depression   . Diabetes (CMS/HHS-HCC)   . Ductal carcinoma in situ (DCIS) of left breast 08/26/2016  . GERD (gastroesophageal reflux disease)   . History of kidney stones   . HLD (hyperlipidemia)   . Hypertension   . Hypothyroidism   . Mitral valve insufficiency   . Other specified pulmonary heart diseases (CMS/HHS-HCC)   . Panic attacks   . PONV (postoperative nausea and vomiting)   . Radial scar of breast 08/26/2016  . Renal lithiasis   . Tricuspid valve insufficiency     Past Surgical History:  Procedure Laterality Date  . COLONOSCOPY  05/03/2014   Polyp resected, not retrieved & FH Colon Polyps - repeat 5 years per Dr. Jeri  . BREAST EXCISIONAL BIOPSY Left 08/19/2016  . MASTECTOMY SIMPLE Left 09/13/2016  . COLONOSCOPY  04/03/2020  . lap right colectomy  05/05/2020  . COLONOSCOPY  05/13/2021   Normal examined colon/PHx CP/Repeat 72yrs/[facility]  . ABDOMINAL HYSTERECTOMY    . CHOLECYSTECTOMY    . MASTECTOMY SURGERY Right     Vitals:   12/02/23 0958  BP: 134/70  Pulse: 70    Physical Exam  General.  Well appearing; NAD; VS reviewed     HEENT: Sclera and conjunctiva clear; EOMI,  Lungs. Respirations unlabored;  Left foot: Tender to the base of the 1st and 2nd toe without bruising. Left knee: ROM intact.  Non tender over patella, tibial tuberosities, distal femur.  No effusion.  mod joint line tenderness.    Extremities:  No edema. Skin. Normal color and turgor Neurologic. Alert and oriented x3   Assessment and Plan 1. Foot pain, left Direct trauma to the distal foot.  Pain is improving.  Suspect contusion.  Low suspicion for fracture.  X-rays today. -     X-ray foot left 3 plus views; Future  2. Chronic pain of left knee  -     X-ray knee left 3 views  3.  Type 2 diabetes mellitus without complication, without long-term current use of insulin  (CMS/HHS-HCC)     I have personally performed this service.  [address] [place], [place]

## 2023-12-12 ENCOUNTER — Other Ambulatory Visit: Payer: Self-pay | Admitting: Family Medicine

## 2024-01-07 ENCOUNTER — Ambulatory Visit
Admission: EM | Admit: 2024-01-07 | Discharge: 2024-01-07 | Disposition: A | Discharge status: Home / Self Care | Attending: Emergency Medicine | Admitting: Emergency Medicine

## 2024-01-07 DIAGNOSIS — L03116 Cellulitis of left lower limb: Secondary | ICD-10-CM | POA: Diagnosis not present

## 2024-01-07 MED ORDER — CEPHALEXIN 500 MG PO CAPS: 500.0000 mg | ORAL_CAPSULE | Freq: Two times a day (BID) | ORAL | 0 refills | Status: AC

## 2024-01-07 NOTE — ED Triage Notes (Signed)
 Patient to Urgent Care with complaints of bilateral sided ankle redness/ warmth/ swelling/ (left side worse). Wearing support hose. Describes aching pain.   Symptoms x2-3 weeks. Seen by her pcp but symptoms have worsened. Pain bad over night.

## 2024-01-07 NOTE — Discharge Instructions (Addendum)
 Follow up with your primary care provider on Monday.  Go to the emergency department if you have worsening symptoms.    Take the cephalexin  as directed.

## 2024-01-07 NOTE — ED Provider Notes (Signed)
 Tammy Cannon    CSN: [id number] Arrival date & time: 01/07/24  9046      History   Chief Complaint Chief Complaint  Patient presents with   Joint Swelling    HPI Tammy Cannon is a 83 y.o. female.  Accompanied by her daughter, patient presents with bilateral lower leg redness, warmth, and swelling x 2 to 3 weeks, worse on the left side.  No open wounds or drainage.  No fever or chills.  No chest pain or shortness of breath.  She has been treating her symptoms with support hose.  She was seen by her PCP on 12/02/2023; diagnosed with left foot pain, chronic left knee pain, diabetes; x-ray of left foot and knee showed arthritis but no acute bony abnormality.  Patient states she redness of her lower legs developed after this visit.  The history is provided by the patient, a relative and medical records.    Past Medical History:  Diagnosis Date   Anemia    Breast cancer (HCC) 1989   Right mastectomy /pos node dineen /Dr Cerame   Cardiomegaly    Colon polyp    Complication of anesthesia    with colonoscopy   Coronary atherosclerosis of native coronary artery    COVID-19    DCIS (ductal carcinoma in situ) 08/19/2016   left breast   Diabetes mellitus without complication (HCC)    Dyspnea    GERD (gastroesophageal reflux disease)    Heart murmur    History of kidney stones    Hypothyroidism    Other and unspecified hyperlipidemia    Other chronic pulmonary heart diseases    Panic attacks    Pneumonia    covid PNA   PONV (postoperative nausea and vomiting)    Unspecified essential hypertension     Patient Active Problem List   Diagnosis Date Noted   Iron  deficiency anemia 05/05/2023   MGUS (monoclonal gammopathy of unknown significance) 05/05/2023   Normocytic anemia 04/22/2023   Chronic kidney disease (CKD), stage III (moderate) (HCC) 04/22/2023   Colon cancer (HCC) 05/05/2020   Chronic venous insufficiency 08/25/2017   Ductal carcinoma in situ (DCIS) of left  breast 08/26/2016   Radial scar of breast 08/26/2016   HYPERLIPIDEMIA-MIXED 03/05/2009   Essential hypertension 03/05/2009   CAD, NATIVE VESSEL 03/05/2009   PULMONARY HYPERTENSION 03/05/2009   VENTRICULAR HYPERTROPHY, LEFT 03/05/2009    Past Surgical History:  Procedure Laterality Date   ABDOMINAL HYSTERECTOMY     BREAST BIOPSY Left 08/19/2016   DUCTAL CARCINOMA IN SITU (DCIS) INVOLVING A SCLEROSING LESION   CHOLECYSTECTOMY     COLONOSCOPY WITH PROPOFOL  N/A 04/03/2020   Procedure: COLONOSCOPY WITH PROPOFOL ;  Surgeon: Therisa Bi, MD;  Location: [facility] ENDOSCOPY;  Service: Gastroenterology;  Laterality: N/A;   COLONOSCOPY WITH PROPOFOL  N/A 05/13/2021   Procedure: COLONOSCOPY WITH PROPOFOL ;  Surgeon: Dessa Reyes ORN, MD;  Location: [facility] ENDOSCOPY;  Service: Endoscopy;  Laterality: N/A;   COLONOSCOPY WITH PROPOFOL  N/A 08/22/2023   Procedure: COLONOSCOPY WITH PROPOFOL ;  Surgeon: Therisa Bi, MD;  Location: [facility] ENDOSCOPY;  Service: Gastroenterology;  Laterality: N/A;   ESOPHAGOGASTRODUODENOSCOPY  08/22/2023   Procedure: EGD (ESOPHAGOGASTRODUODENOSCOPY);  Surgeon: Therisa Bi, MD;  Location: [facility] ENDOSCOPY;  Service: Gastroenterology;;   LAPAROSCOPIC RIGHT COLECTOMY Right 05/05/2020   Procedure: LAPAROSCOPIC RIGHT COLECTOMY;  Surgeon: Dessa Reyes ORN, MD;  Location: [facility] ORS;  Service: General;  Laterality: Right;   lumpectomy (otheR)     MASTECTOMY     both   SENTINEL NODE  BIOPSY Left 09/13/2016   Procedure: SENTINEL NODE BIOPSY;  Surgeon: Reyes LELON Cota, MD;  Location: [facility] ORS;  Service: General;  Laterality: Left;   SIMPLE MASTECTOMY WITH AXILLARY SENTINEL NODE BIOPSY Left 09/13/2016   Procedure: SIMPLE MASTECTOMY;  Surgeon: Reyes LELON Cota, MD;  Location: [facility] ORS;  Service: General;  Laterality: Left;    OB History     Gravida  1   Para  1   Term      Preterm      AB      Living         SAB      IAB      Ectopic      Multiple      Live Births            Obstetric Comments  1st Menstrual Cycle:  13  1st Pregnancy:  20           Home Medications    Prior to Admission medications   Medication Sig Start Date End Date Taking? Authorizing Provider  cephALEXin  (KEFLEX ) 500 MG capsule Take 1 capsule (500 mg total) by mouth 2 (two) times daily for 7 days. 01/07/24 01/14/24 Yes Corlis Burnard DEL, NP  ALPRAZolam (XANAX) 0.5 MG tablet Take 0.5 mg by mouth daily. 12/14/22   [provider]  aspirin  EC 81 MG tablet Take 81 mg by mouth daily as needed (heart murmur). Swallow whole.    [provider]  glimepiride (AMARYL) 2 MG tablet Take 2 mg by mouth 2 (two) times daily. 02/15/23   [provider]  hydrochlorothiazide (MICROZIDE) 12.5 MG capsule Take by mouth. 07/17/20   [provider]  levothyroxine  (SYNTHROID ) 75 MCG tablet Take 1 tablet (75 mcg total) by mouth daily. 02/20/23   Corlis Burnard DEL, NP  linaclotide LARUE) 145 MCG CAPS capsule Take by mouth.    [provider]  pantoprazole  (PROTONIX ) 40 MG tablet Take 1 tablet (40 mg total) by mouth daily. 02/20/23   Corlis Burnard DEL, NP  PARoxetine (PAXIL) 10 MG tablet Take 10 mg by mouth daily.    [provider]  pravastatin (PRAVACHOL) 40 MG tablet Take 40 mg by mouth at bedtime.    [provider]  trandolapril  (MAVIK ) 4 MG tablet Take 1 tablet (4 mg total) by mouth daily. 02/20/23   Corlis Burnard DEL, NP  traZODone  (DESYREL ) 50 MG tablet Take 50 mg by mouth at bedtime.     [provider]  verapamil  (VERELAN  PM) 240 MG 24 hr capsule Take 240 mg by mouth at bedtime.     [provider]    Family History Family History  Problem Relation Age of Onset   Breast cancer Sister     Social History Social History   Tobacco Use   Smoking status: Never   Smokeless tobacco: Never  Vaping Use   Vaping status: Never Used  Substance Use Topics   Alcohol use: No   Drug use: No     Allergies   Patient has no known  allergies.   Review of Systems Review of Systems  Constitutional:  Negative for chills and fever.  Respiratory:  Negative for cough and shortness of breath.   Cardiovascular:  Negative for chest pain and palpitations.  Musculoskeletal:  Positive for arthralgias and joint swelling. Negative for gait problem.  Skin:  Positive for color change. Negative for wound.  Neurological:  Negative for weakness and numbness.     Physical Exam  Triage Vital Signs ED Triage Vitals  Encounter Vitals Group     BP 01/07/24 1005 (!) 148/75     Girls Systolic BP Percentile --      Girls Diastolic BP Percentile --      Boys Systolic BP Percentile --      Boys Diastolic BP Percentile --      Pulse Rate 01/07/24 1005 64     Resp --      Temp 01/07/24 1005 97.9 F (36.6 C)     Temp src --      SpO2 01/07/24 1005 99 %     Weight --      Height --      Head Circumference --      Peak Flow --      Pain Score 01/07/24 1004 9     Pain Loc --      Pain Education --      Exclude from Growth Chart --    No data found.  Updated Vital Signs BP (!) 148/75   Pulse 64   Temp 97.9 F (36.6 C)   Resp 17   SpO2 99%   Visual Acuity Right Eye Distance:   Left Eye Distance:   Bilateral Distance:    Right Eye Near:   Left Eye Near:    Bilateral Near:     Physical Exam Constitutional:      General: She is not in acute distress. HENT:     Mouth/Throat:     Mouth: Mucous membranes are moist.  Cardiovascular:     Rate and Rhythm: Normal rate and regular rhythm.  Pulmonary:     Effort: Pulmonary effort is normal. No respiratory distress.  Musculoskeletal:        General: Swelling and tenderness present. No deformity. Normal range of motion.  Skin:    General: Skin is warm and dry.     Findings: Erythema present. No lesion.     Comments: Left lower leg mildly erythematous from mid calf to ankle.  Slight left ankle edema.  No wounds or drainage.  Very light erythema noted on right lower leg; no  wounds or drainage.  Neurological:     General: No focal deficit present.     Mental Status: She is alert.     Sensory: No sensory deficit.     Motor: No weakness.     Gait: Gait normal.      UC Treatments / Results  Labs (all labs ordered are listed, but only abnormal results are displayed) Labs Reviewed - No data to display  EKG   Radiology No results found.  Procedures Procedures (including critical care time)  Medications Ordered in UC Medications - No data to display  Initial Impression / Assessment and Plan / UC Course  I have reviewed the triage vital signs and the nursing notes.  Pertinent labs & imaging results that were available during my care of the patient were reviewed by me and considered in my medical decision making (see chart for details).    Cellulitis of left lower leg.  Afebrile and vital signs are stable.  Treating with cephalexin  (renal dosing).  Education provided on cellulitis.  Instructed patient to follow-up with her PCP on Monday.  ED precautions given.  Patient and her daughter agree to plan of care.  Final Clinical Impressions(s) / UC Diagnoses   Final diagnoses:  Cellulitis of left lower leg     Discharge Instructions      Follow  up with your primary care provider on Monday.  Go to the emergency department if you have worsening symptoms.    Take the cephalexin  as directed.      ED Prescriptions     Medication Sig Dispense Auth. Provider   cephALEXin  (KEFLEX ) 500 MG capsule Take 1 capsule (500 mg total) by mouth 2 (two) times daily for 7 days. 14 capsule Corlis Burnard DEL, NP      PDMP not reviewed this encounter.   Corlis Burnard DEL, NP 01/07/24 1050

## 2024-01-11 ENCOUNTER — Encounter: Payer: Self-pay | Admitting: Infectious Diseases

## 2024-01-11 ENCOUNTER — Other Ambulatory Visit: Payer: Self-pay | Admitting: Infectious Diseases

## 2024-01-11 DIAGNOSIS — M7989 Other specified soft tissue disorders: Secondary | ICD-10-CM

## 2024-01-11 DIAGNOSIS — L03116 Cellulitis of left lower limb: Secondary | ICD-10-CM | POA: Diagnosis not present

## 2024-01-11 DIAGNOSIS — I83893 Varicose veins of bilateral lower extremities with other complications: Secondary | ICD-10-CM | POA: Diagnosis not present

## 2024-01-17 ENCOUNTER — Ambulatory Visit
Admission: RE | Admit: 2024-01-17 | Discharge: 2024-01-17 | Disposition: A | Discharge status: Home / Self Care | Source: Ambulatory Visit | Attending: Infectious Diseases | Admitting: Infectious Diseases

## 2024-01-17 DIAGNOSIS — M7989 Other specified soft tissue disorders: Secondary | ICD-10-CM | POA: Diagnosis not present

## 2024-01-17 DIAGNOSIS — L03116 Cellulitis of left lower limb: Secondary | ICD-10-CM | POA: Diagnosis not present

## 2024-01-30 ENCOUNTER — Emergency Department
Admission: EM | Admit: 2024-01-30 | Discharge: 2024-01-30 | Disposition: A | Discharge status: Home / Self Care | Attending: Emergency Medicine | Admitting: Emergency Medicine

## 2024-01-30 ENCOUNTER — Emergency Department

## 2024-01-30 ENCOUNTER — Other Ambulatory Visit: Payer: Self-pay

## 2024-01-30 ENCOUNTER — Ambulatory Visit
Admission: EM | Admit: 2024-01-30 | Discharge: 2024-01-30 | Disposition: A | Discharge status: Home / Self Care | Attending: Emergency Medicine | Admitting: Emergency Medicine

## 2024-01-30 DIAGNOSIS — R0602 Shortness of breath: Secondary | ICD-10-CM

## 2024-01-30 DIAGNOSIS — R059 Cough, unspecified: Secondary | ICD-10-CM | POA: Diagnosis not present

## 2024-01-30 DIAGNOSIS — U071 COVID-19: Secondary | ICD-10-CM | POA: Insufficient documentation

## 2024-01-30 DIAGNOSIS — I129 Hypertensive chronic kidney disease with stage 1 through stage 4 chronic kidney disease, or unspecified chronic kidney disease: Secondary | ICD-10-CM | POA: Diagnosis not present

## 2024-01-30 DIAGNOSIS — R509 Fever, unspecified: Secondary | ICD-10-CM | POA: Diagnosis present

## 2024-01-30 DIAGNOSIS — N189 Chronic kidney disease, unspecified: Secondary | ICD-10-CM | POA: Diagnosis not present

## 2024-01-30 DIAGNOSIS — R051 Acute cough: Secondary | ICD-10-CM

## 2024-01-30 DIAGNOSIS — I2510 Atherosclerotic heart disease of native coronary artery without angina pectoris: Secondary | ICD-10-CM | POA: Insufficient documentation

## 2024-01-30 DIAGNOSIS — R42 Dizziness and giddiness: Secondary | ICD-10-CM | POA: Diagnosis not present

## 2024-01-30 DIAGNOSIS — I160 Hypertensive urgency: Secondary | ICD-10-CM | POA: Insufficient documentation

## 2024-01-30 DIAGNOSIS — K449 Diaphragmatic hernia without obstruction or gangrene: Secondary | ICD-10-CM | POA: Diagnosis not present

## 2024-01-30 LAB — BASIC METABOLIC PANEL WITH GFR
Anion gap: 12 (ref 5–15)
BUN: 21 mg/dL (ref 8–23)
CO2: 26 mmol/L (ref 22–32)
Calcium: 9.6 mg/dL (ref 8.9–10.3)
Chloride: 102 mmol/L (ref 98–111)
Creatinine, Ser: 1.38 mg/dL — ABNORMAL HIGH (ref 0.44–1.00)
GFR, Estimated: 38 mL/min — ABNORMAL LOW (ref >60)
Glucose, Bld: 100 mg/dL — ABNORMAL HIGH (ref 70–99)
Potassium: 4 mmol/L (ref 3.5–5.1)
Sodium: 140 mmol/L (ref 135–145)

## 2024-01-30 LAB — CBC
HCT: 40.3 % (ref 36.0–46.0)
Hemoglobin: 13.5 g/dL (ref 12.0–15.0)
MCH: 31.7 pg (ref 26.0–34.0)
MCHC: 33.5 g/dL (ref 30.0–36.0)
MCV: 94.6 fL (ref 80.0–100.0)
Platelets: 159 K/uL (ref 150–400)
RBC: 4.26 MIL/uL (ref 3.87–5.11)
RDW: 13.7 % (ref 11.5–15.5)
WBC: 5.1 K/uL (ref 4.0–10.5)
nRBC: 0 % (ref 0.0–0.2)

## 2024-01-30 LAB — POC COVID19/FLU A&B COMBO
Covid Antigen, POC: POSITIVE — AB
Influenza A Antigen, POC: NEGATIVE
Influenza B Antigen, POC: NEGATIVE

## 2024-01-30 LAB — TROPONIN I (HIGH SENSITIVITY): Troponin I (High Sensitivity): 15 ng/L (ref <18)

## 2024-01-30 MED ORDER — BENZONATATE 100 MG PO CAPS: 100.0000 mg | ORAL_CAPSULE | Freq: Three times a day (TID) | ORAL | 0 refills | Status: AC | PRN

## 2024-01-30 MED ORDER — ACETAMINOPHEN 325 MG PO TABS
650.0000 mg | ORAL_TABLET | Freq: Once | ORAL | Status: AC
  Administered 2024-01-30: 650 mg via ORAL
  Filled 2024-01-30: qty 2

## 2024-01-30 NOTE — Discharge Instructions (Signed)
 Go to the emergency department for evaluation of your hypertensive urgency, COVID-19, shortness of breath, dizziness, cough.    Blood pressure 200/79, repeat 215/101.

## 2024-01-30 NOTE — ED Triage Notes (Signed)
 Pt states that she has a cough, scratchy throat, watery eyes, dizziness, sob and nasal congestion. X3 days

## 2024-01-30 NOTE — ED Triage Notes (Signed)
 Pt comes from UC with c/o COVID+. Pt has BP checked and it was elevated. Pt does take meds but at night which she hasn't done.

## 2024-01-30 NOTE — Discharge Instructions (Addendum)
 You were seen in the ER today for evaluation of your respiratory symptoms and elevated blood pressure.  I suspect your symptoms are likely related to your COVID-19 infection.  Your blood pressure was elevated here, but we did not see signs of emergency effects from this.  Continue to take your medication as directed.  Follow-up your primary care doctor for further evaluation.  Return to the ER for new or worsening symptoms.

## 2024-01-30 NOTE — ED Provider Notes (Signed)
 Tammy Cannon    CSN: [id number] Arrival date & time: 01/30/24  1718      History   Chief Complaint Chief Complaint  Patient presents with   Cough    HPI Tammy Cannon is a 83 y.o. female.  Accompanied by her daughter, patient presents with 3-day history of congestion, scratchy throat, cough, shortness of breath, dizziness, weakness.  No fever, chest pain, focal weakness, numbness.  Treating symptoms with Tylenol .  Her medical history includes hypertension, ventricular hypertrophy, pulmonary hypertension, CKD, coronary artery disease, hyperlipidemia.  The history is provided by the patient, a relative and medical records.    Past Medical History:  Diagnosis Date   Anemia    Breast cancer (HCC) 1989   Right mastectomy /pos node dineen /Dr Cerame   Cardiomegaly    Colon polyp    Complication of anesthesia    with colonoscopy   Coronary atherosclerosis of native coronary artery    COVID-19    DCIS (ductal carcinoma in situ) 08/19/2016   left breast   Diabetes mellitus without complication (HCC)    Dyspnea    GERD (gastroesophageal reflux disease)    Heart murmur    History of kidney stones    Hypothyroidism    Other and unspecified hyperlipidemia    Other chronic pulmonary heart diseases    Panic attacks    Pneumonia    covid PNA   PONV (postoperative nausea and vomiting)    Unspecified essential hypertension     Patient Active Problem List   Diagnosis Date Noted   Iron  deficiency anemia 05/05/2023   MGUS (monoclonal gammopathy of unknown significance) 05/05/2023   Normocytic anemia 04/22/2023   Chronic kidney disease (CKD), stage III (moderate) (HCC) 04/22/2023   Colon cancer (HCC) 05/05/2020   Chronic venous insufficiency 08/25/2017   Ductal carcinoma in situ (DCIS) of left breast 08/26/2016   Radial scar of breast 08/26/2016   HYPERLIPIDEMIA-MIXED 03/05/2009   Essential hypertension 03/05/2009   CAD, NATIVE VESSEL 03/05/2009   PULMONARY  HYPERTENSION 03/05/2009   VENTRICULAR HYPERTROPHY, LEFT 03/05/2009    Past Surgical History:  Procedure Laterality Date   ABDOMINAL HYSTERECTOMY     BREAST BIOPSY Left 08/19/2016   DUCTAL CARCINOMA IN SITU (DCIS) INVOLVING A SCLEROSING LESION   CHOLECYSTECTOMY     COLONOSCOPY WITH PROPOFOL  N/A 04/03/2020   Procedure: COLONOSCOPY WITH PROPOFOL ;  Surgeon: Therisa Bi, MD;  Location: [facility] ENDOSCOPY;  Service: Gastroenterology;  Laterality: N/A;   COLONOSCOPY WITH PROPOFOL  N/A 05/13/2021   Procedure: COLONOSCOPY WITH PROPOFOL ;  Surgeon: Dessa Reyes ORN, MD;  Location: [facility] ENDOSCOPY;  Service: Endoscopy;  Laterality: N/A;   COLONOSCOPY WITH PROPOFOL  N/A 08/22/2023   Procedure: COLONOSCOPY WITH PROPOFOL ;  Surgeon: Therisa Bi, MD;  Location: [facility] ENDOSCOPY;  Service: Gastroenterology;  Laterality: N/A;   ESOPHAGOGASTRODUODENOSCOPY  08/22/2023   Procedure: EGD (ESOPHAGOGASTRODUODENOSCOPY);  Surgeon: Therisa Bi, MD;  Location: [facility] ENDOSCOPY;  Service: Gastroenterology;;   LAPAROSCOPIC RIGHT COLECTOMY Right 05/05/2020   Procedure: LAPAROSCOPIC RIGHT COLECTOMY;  Surgeon: Dessa Reyes ORN, MD;  Location: [facility] ORS;  Service: General;  Laterality: Right;   lumpectomy (otheR)     MASTECTOMY     both   SENTINEL NODE BIOPSY Left 09/13/2016   Procedure: SENTINEL NODE BIOPSY;  Surgeon: Reyes ORN Dessa, MD;  Location: [facility] ORS;  Service: General;  Laterality: Left;   SIMPLE MASTECTOMY WITH AXILLARY SENTINEL NODE BIOPSY Left 09/13/2016   Procedure: SIMPLE MASTECTOMY;  Surgeon: Reyes ORN Dessa, MD;  Location: [facility] ORS;  Service: General;  Laterality: Left;    OB History     Gravida  1   Para  1   Term      Preterm      AB      Living         SAB      IAB      Ectopic      Multiple      Live Births           Obstetric Comments  1st Menstrual Cycle:  13  1st Pregnancy:  20           Home Medications    Prior to Admission medications   Medication Sig Start Date End  Date Taking? Authorizing Provider  ALPRAZolam (XANAX) 0.5 MG tablet Take 0.5 mg by mouth daily. 12/14/22  Yes [provider]  aspirin  EC 81 MG tablet Take 81 mg by mouth daily as needed (heart murmur). Swallow whole.   Yes [provider]  glimepiride (AMARYL) 2 MG tablet Take 2 mg by mouth 2 (two) times daily. 02/15/23  Yes [provider]  hydrochlorothiazide (MICROZIDE) 12.5 MG capsule Take by mouth. 07/17/20  Yes [provider]  levothyroxine  (SYNTHROID ) 75 MCG tablet Take 1 tablet (75 mcg total) by mouth daily. 02/20/23  Yes Corlis Burnard DEL, NP  linaclotide LARUE) 145 MCG CAPS capsule Take by mouth.   Yes [provider]  pantoprazole  (PROTONIX ) 40 MG tablet Take 1 tablet (40 mg total) by mouth daily. 02/20/23  Yes Corlis Burnard DEL, NP  PARoxetine (PAXIL) 10 MG tablet Take 10 mg by mouth daily.   Yes [provider]  pravastatin (PRAVACHOL) 40 MG tablet Take 40 mg by mouth at bedtime.   Yes [provider]  trandolapril  (MAVIK ) 4 MG tablet Take 1 tablet (4 mg total) by mouth daily. 02/20/23  Yes Corlis Burnard DEL, NP  traZODone  (DESYREL ) 50 MG tablet Take 50 mg by mouth at bedtime.    Yes [provider]  verapamil  (VERELAN  PM) 240 MG 24 hr capsule Take 240 mg by mouth at bedtime.    Yes [provider]    Family History Family History  Problem Relation Age of Onset   Breast cancer Sister     Social History Social History   Tobacco Use   Smoking status: Never   Smokeless tobacco: Never  Vaping Use   Vaping status: Never Used  Substance Use Topics   Alcohol use: No   Drug use: No     Allergies   Patient has no known allergies.   Review of Systems Review of Systems  Constitutional:  Negative for chills and fever.  HENT:  Positive for congestion and sore throat. Negative for ear pain.   Respiratory:  Positive for cough and shortness of breath.   Cardiovascular:  Negative for chest pain and  palpitations.  Neurological:  Positive for dizziness. Negative for weakness and numbness.     Physical Exam Triage Vital Signs ED Triage Vitals  Encounter Vitals Group     BP 01/30/24 1803 (!) 200/79     Girls Systolic BP Percentile --      Girls Diastolic BP Percentile --      Boys Systolic BP Percentile --      Boys Diastolic BP Percentile --      Pulse Rate 01/30/24 1803 79     Resp 01/30/24 1803 16     Temp 01/30/24 1803 99.8 F (37.7  C)     Temp Source 01/30/24 1803 Oral     SpO2 01/30/24 1803 97 %     Weight 01/30/24 1801 146 lb (66.2 kg)     Height 01/30/24 1801 5' 3 (1.6 m)     Head Circumference --      Peak Flow --      Pain Score 01/30/24 1801 8     Pain Loc --      Pain Education --      Exclude from Growth Chart --    No data found.  Updated Vital Signs BP (!) 215/101 (BP Location: Left Arm)   Pulse 79   Temp 99.8 F (37.7 C) (Oral)   Resp 16   Ht 5' 3 (1.6 m)   Wt 146 lb (66.2 kg)   SpO2 97%   BMI 25.86 kg/m   Visual Acuity Right Eye Distance:   Left Eye Distance:   Bilateral Distance:    Right Eye Near:   Left Eye Near:    Bilateral Near:     Physical Exam Constitutional:      General: She is not in acute distress.    Appearance: She is ill-appearing.  HENT:     Mouth/Throat:     Mouth: Mucous membranes are moist.  Cardiovascular:     Rate and Rhythm: Normal rate and regular rhythm.     Heart sounds: Normal heart sounds.  Pulmonary:     Effort: Pulmonary effort is normal. No respiratory distress.     Breath sounds: Normal breath sounds.  Skin:    General: Skin is warm and dry.  Neurological:     General: No focal deficit present.     Mental Status: She is alert.     Sensory: No sensory deficit.     Motor: No weakness.     Gait: Gait normal.      UC Treatments / Results  Labs (all labs ordered are listed, but only abnormal results are displayed) Labs Reviewed  POC COVID19/FLU A&B COMBO - Abnormal; Notable for the  following components:      Result Value   Covid Antigen, POC Positive (*)    All other components within normal limits    EKG   Radiology No results found.  Procedures Procedures (including critical care time)  Medications Ordered in UC Medications - No data to display  Initial Impression / Assessment and Plan / UC Course  I have reviewed the triage vital signs and the nursing notes.  Pertinent labs & imaging results that were available during my care of the patient were reviewed by me and considered in my medical decision making (see chart for details).    Hypertensive urgency, COVID-19, shortness of breath, dizziness, cough.  Blood pressure on arrival 200/79.  Repeat blood pressure 215/101.  Rapid COVID test positive.  Flu negative.  Patient has had some generalized weakness, shortness of breath, dizziness.  Sending her to the ED for evaluation.  She is agreeable to this.  She declines EMS.  Her daughter is with her and will drive [facility] to [facility] ED.  Final Clinical Impressions(s) / UC Diagnoses   Final diagnoses:  Hypertensive urgency  COVID-19  Shortness of breath  Acute cough  Dizziness     Discharge Instructions      Go to the emergency department for evaluation of your hypertensive urgency, COVID-19, shortness of breath, dizziness, cough.    Blood pressure 200/79, repeat 215/101.     ED Prescriptions  None    PDMP not reviewed this encounter.   Corlis Burnard DEL, NP 01/30/24 [phone]

## 2024-01-30 NOTE — ED Provider Notes (Signed)
 [facility] Provider Note    Event Date/Time   First MD Initiated Contact with Patient 01/30/24 2053     (approximate)   History   covid+   HPI  Tammy Cannon is a 83 year old female with history of HTN, CKD, CAD presenting to the emergency department for evaluation of congestion, shortness of breath, fevers.  Patient reports that over the last week she has had ongoing respiratory symptoms.  She went to urgent care today where she did have a COVID test that returned positive.  She was noted to be hypertensive at urgent care and was sent to the ER in the setting of this.  No reported chest pain, headache, numbness, tingling, focal weakness.  No nausea or vomiting, but has had some decreased p.o. intake.      Physical Exam   Triage Vital Signs: ED Triage Vitals  Encounter Vitals Group     BP 01/30/24 1851 (!) 198/94     Girls Systolic BP Percentile --      Girls Diastolic BP Percentile --      Boys Systolic BP Percentile --      Boys Diastolic BP Percentile --      Pulse Rate 01/30/24 1851 78     Resp 01/30/24 1851 19     Temp 01/30/24 1851 (!) 100.4 F (38 C)     Temp Source 01/30/24 2106 Oral     SpO2 01/30/24 1851 97 %     Weight 01/30/24 1850 146 lb (66.2 kg)     Height 01/30/24 1850 5' 3 (1.6 m)     Head Circumference --      Peak Flow --      Pain Score 01/30/24 1850 7     Pain Loc --      Pain Education --      Exclude from Growth Chart --     Most recent vital signs: Vitals:   01/30/24 1851 01/30/24 2106  BP: (!) 198/94 (!) 161/78  Pulse: 78 70  Resp: 19 18  Temp: (!) 100.4 F (38 C) 98.4 F (36.9 C)  SpO2: 97% 96%     General: Awake, interactive  CV:  Regular rate, good peripheral perfusion.  Resp:  Unlabored respirations, lungs clear to auscultation Abd:  Nondistended, soft, nontender Neuro:  Symmetric facial movement, fluid speech, moves all extremity spontaneously and equally   ED Results / Procedures / Treatments    Labs (all labs ordered are listed, but only abnormal results are displayed) Labs Reviewed  BASIC METABOLIC PANEL WITH GFR - Abnormal; Notable for the following components:      Result Value   Glucose, Bld 100 (*)    Creatinine, Ser 1.38 (*)    GFR, Estimated 38 (*)    All other components within normal limits  CBC  TROPONIN I (HIGH SENSITIVITY)     EKG EKG independently reviewed and interpreted by myself demonstrates:  EKG demonstrates normal sinus rhythm at a rate of 63, PR 148, QRS 78, QTc 450, no acute ST changes  RADIOLOGY Imaging independently reviewed and interpreted by myself demonstrates:  CXR without focal consolidation  Formal Radiology Read:  DG Chest 2 View Result Date: 01/30/2024 CLINICAL DATA:  Cough.  COVID positive. EXAM: CHEST - 2 VIEW COMPARISON:  Chest radiograph 05/09/2019, CT 04/16/2020 FINDINGS: Stable heart size and mediastinal contours. Retrocardiac hiatal hernia. No focal opacity, large pleural effusion or pneumothorax. Chronic lower thoracic compression deformity. IMPRESSION: 1. No acute chest findings.  2. Retrocardiac hiatal hernia. Electronically Signed   By: Andrea Gasman M.D.   On: 01/30/2024 19:58    PROCEDURES:  Critical Care performed: No  Procedures   MEDICATIONS ORDERED IN ED: Medications  acetaminophen  (TYLENOL ) tablet 650 mg (650 mg Oral Given 01/30/24 1856)     IMPRESSION / MDM / ASSESSMENT AND PLAN / ED COURSE  I reviewed the triage vital signs and the nursing notes.  Differential diagnosis includes, but is not limited to, sequela from known COVID-19 infection, much lower suspicion hypertensive emergency, consideration for dehydration, anemia, electrolyte abnormality  Patient's presentation is most consistent with acute presentation with potential threat to life or bodily function.  83 year old female presenting with elevated blood pressure and 1 week of upper respiratory symptoms.  Borderline febrile on presentation here  with elevated blood pressure.  Received Tylenol  with improvement in both her fever and blood pressure.  Labs with reassuring CBC, BMP with mildly elevated creatinine from recent baseline.  Suspect opponent of dehydration.  Negative troponin.  X-Niki Cosman without evidence of pneumonia.  EKG reassuring.  Suspect patient may have some dehydration with her elevated creatinine and some decreased p.o. intake.  I did discuss IV fluids, but patient denies any vomiting, would prefer to intake p.o. fluids.  No evidence of hypertensive emergency, patient does prefer discharge with strict return precautions and outpatient follow-up.  With 1 week of symptoms, did discuss risk versus benefits of monoclonal antibody and patient would like to hold off.  Strict return precautions provided.  Patient discharged in stable condition.      FINAL CLINICAL IMPRESSION(S) / ED DIAGNOSES   Final diagnoses:  COVID-19  Hypertensive urgency     Rx / DC Orders   ED Discharge Orders     None        Note:  This document was prepared using Dragon voice recognition software and may include unintentional dictation errors.   Levander Slate, MD 01/30/24 2152

## 2024-02-03 ENCOUNTER — Other Ambulatory Visit: Payer: Self-pay | Admitting: *Deleted

## 2024-02-03 DIAGNOSIS — D509 Iron deficiency anemia, unspecified: Secondary | ICD-10-CM

## 2024-02-03 DIAGNOSIS — D472 Monoclonal gammopathy: Secondary | ICD-10-CM

## 2024-02-06 ENCOUNTER — Inpatient Hospital Stay: Attending: Oncology

## 2024-02-06 DIAGNOSIS — N183 Chronic kidney disease, stage 3 unspecified: Secondary | ICD-10-CM | POA: Insufficient documentation

## 2024-02-06 DIAGNOSIS — I129 Hypertensive chronic kidney disease with stage 1 through stage 4 chronic kidney disease, or unspecified chronic kidney disease: Secondary | ICD-10-CM | POA: Insufficient documentation

## 2024-02-06 DIAGNOSIS — D509 Iron deficiency anemia, unspecified: Secondary | ICD-10-CM | POA: Insufficient documentation

## 2024-02-06 DIAGNOSIS — D472 Monoclonal gammopathy: Secondary | ICD-10-CM | POA: Insufficient documentation

## 2024-02-06 DIAGNOSIS — Z853 Personal history of malignant neoplasm of breast: Secondary | ICD-10-CM | POA: Insufficient documentation

## 2024-02-06 LAB — CBC WITH DIFFERENTIAL/PLATELET
Abs Immature Granulocytes: 0.01 K/uL (ref 0.00–0.07)
Basophils Absolute: 0 K/uL (ref 0.0–0.1)
Basophils Relative: 0 %
Eosinophils Absolute: 0.1 K/uL (ref 0.0–0.5)
Eosinophils Relative: 1 %
HCT: 38.8 % (ref 36.0–46.0)
Hemoglobin: 12.8 g/dL (ref 12.0–15.0)
Immature Granulocytes: 0 %
Lymphocytes Relative: 38 %
Lymphs Abs: 2.1 K/uL (ref 0.7–4.0)
MCH: 30.5 pg (ref 26.0–34.0)
MCHC: 33 g/dL (ref 30.0–36.0)
MCV: 92.6 fL (ref 80.0–100.0)
Monocytes Absolute: 0.3 K/uL (ref 0.1–1.0)
Monocytes Relative: 5 %
Neutro Abs: 3.1 K/uL (ref 1.7–7.7)
Neutrophils Relative %: 56 %
Platelets: 186 K/uL (ref 150–400)
RBC: 4.19 MIL/uL (ref 3.87–5.11)
RDW: 13.7 % (ref 11.5–15.5)
WBC: 5.6 K/uL (ref 4.0–10.5)
nRBC: 0 % (ref 0.0–0.2)

## 2024-02-06 LAB — CMP (CANCER CENTER ONLY)
ALT: 13 U/L (ref 0–44)
AST: 19 U/L (ref 15–41)
Albumin: 4.2 g/dL (ref 3.5–5.0)
Alkaline Phosphatase: 68 U/L (ref 38–126)
Anion gap: 7 (ref 5–15)
BUN: 25 mg/dL — ABNORMAL HIGH (ref 8–23)
CO2: 23 mmol/L (ref 22–32)
Calcium: 9.3 mg/dL (ref 8.9–10.3)
Chloride: 106 mmol/L (ref 98–111)
Creatinine: 1.2 mg/dL — ABNORMAL HIGH (ref 0.44–1.00)
GFR, Estimated: 45 mL/min — ABNORMAL LOW (ref >60)
Glucose, Bld: 99 mg/dL (ref 70–99)
Potassium: 3.8 mmol/L (ref 3.5–5.1)
Sodium: 136 mmol/L (ref 135–145)
Total Bilirubin: 0.9 mg/dL (ref 0.0–1.2)
Total Protein: 8.2 g/dL — ABNORMAL HIGH (ref 6.5–8.1)

## 2024-02-06 LAB — IRON AND TIBC
Iron: 58 ug/dL (ref 28–170)
Saturation Ratios: 15 % (ref 10.4–31.8)
TIBC: 385 ug/dL (ref 250–450)
UIBC: 327 ug/dL

## 2024-02-06 LAB — FERRITIN: Ferritin: 87 ng/mL (ref 11–307)

## 2024-02-07 LAB — KAPPA/LAMBDA LIGHT CHAINS
Kappa free light chain: 39.1 mg/L — ABNORMAL HIGH (ref 3.3–19.4)
Kappa, lambda light chain ratio: 0.24 — ABNORMAL LOW (ref 0.26–1.65)
Lambda free light chains: 161.3 mg/L — ABNORMAL HIGH (ref 5.7–26.3)

## 2024-02-08 LAB — IGG, IGA, IGM
IgA: 56 mg/dL — ABNORMAL LOW (ref 64–422)
IgG (Immunoglobin G), Serum: 2127 mg/dL — ABNORMAL HIGH (ref 586–1602)
IgM (Immunoglobulin M), Srm: 36 mg/dL (ref 26–217)

## 2024-02-08 LAB — PROTEIN ELECTROPHORESIS, SERUM
A/G Ratio: 1.1 (ref 0.7–1.7)
Albumin ELP: 4 g/dL (ref 2.9–4.4)
Alpha-1-Globulin: 0.2 g/dL (ref 0.0–0.4)
Alpha-2-Globulin: 0.7 g/dL (ref 0.4–1.0)
Beta Globulin: 0.9 g/dL (ref 0.7–1.3)
Gamma Globulin: 1.7 g/dL (ref 0.4–1.8)
Globulin, Total: 3.6 g/dL (ref 2.2–3.9)
M-Spike, %: 1.3 g/dL — ABNORMAL HIGH
Total Protein ELP: 7.6 g/dL (ref 6.0–8.5)

## 2024-02-20 ENCOUNTER — Ambulatory Visit: Admitting: Oncology

## 2024-02-20 ENCOUNTER — Ambulatory Visit

## 2024-02-27 DIAGNOSIS — Z4431 Encounter for fitting and adjustment of external right breast prosthesis: Secondary | ICD-10-CM | POA: Diagnosis not present

## 2024-02-27 DIAGNOSIS — N1832 Chronic kidney disease, stage 3b: Secondary | ICD-10-CM | POA: Diagnosis not present

## 2024-02-27 DIAGNOSIS — E119 Type 2 diabetes mellitus without complications: Secondary | ICD-10-CM | POA: Diagnosis not present

## 2024-02-27 DIAGNOSIS — D472 Monoclonal gammopathy: Secondary | ICD-10-CM | POA: Diagnosis not present

## 2024-02-27 DIAGNOSIS — I10 Essential (primary) hypertension: Secondary | ICD-10-CM | POA: Diagnosis not present

## 2024-02-27 DIAGNOSIS — C50111 Malignant neoplasm of central portion of right female breast: Secondary | ICD-10-CM | POA: Diagnosis not present

## 2024-02-27 DIAGNOSIS — Z9013 Acquired absence of bilateral breasts and nipples: Secondary | ICD-10-CM | POA: Diagnosis not present

## 2024-02-27 DIAGNOSIS — E782 Mixed hyperlipidemia: Secondary | ICD-10-CM | POA: Diagnosis not present

## 2024-02-27 DIAGNOSIS — Z4432 Encounter for fitting and adjustment of external left breast prosthesis: Secondary | ICD-10-CM | POA: Diagnosis not present

## 2024-02-27 DIAGNOSIS — I517 Cardiomegaly: Secondary | ICD-10-CM | POA: Diagnosis not present

## 2024-03-05 ENCOUNTER — Inpatient Hospital Stay: Attending: Oncology | Admitting: Oncology

## 2024-03-05 ENCOUNTER — Inpatient Hospital Stay

## 2024-03-05 ENCOUNTER — Encounter: Payer: Self-pay | Admitting: Oncology

## 2024-03-05 VITALS — BP 140/90 | HR 54 | Temp 97.9°F | Resp 18 | Ht 63.0 in | Wt 142.0 lb

## 2024-03-05 DIAGNOSIS — Z85038 Personal history of other malignant neoplasm of large intestine: Secondary | ICD-10-CM | POA: Diagnosis present

## 2024-03-05 DIAGNOSIS — D472 Monoclonal gammopathy: Secondary | ICD-10-CM | POA: Diagnosis not present

## 2024-03-05 DIAGNOSIS — Z803 Family history of malignant neoplasm of breast: Secondary | ICD-10-CM | POA: Insufficient documentation

## 2024-03-05 DIAGNOSIS — D509 Iron deficiency anemia, unspecified: Secondary | ICD-10-CM | POA: Diagnosis not present

## 2024-03-05 NOTE — Progress Notes (Signed)
 [facility]  Telephone:([phone] Fax:(336) [phone]  ID: MARRIE CHANDRA OB: July 02, 1940  MR#: [id number]  RDW#:[id number]  Patient Care Team: Epifanio Alm SQUIBB, MD as PCP - General (Infectious Diseases) Yasmin Sauer, MD as Referring Physician (Obstetrics and Gynecology) Jacobo Evalene PARAS, MD as Consulting Physician (Oncology)  CHIEF COMPLAINT: MGUS, iron  deficiency anemia.  INTERVAL HISTORY: Patient previously seen by another provider.  She returns to clinic today for repeat laboratory work and further evaluation.  She is chronic fatigue, but otherwise feels well.  She has no neurologic complaints.  She denies any recent fevers or illnesses.  She has a good appetite and denies weight loss.  She has no chest pain, shortness of breath, cough, or hemoptysis.  She denies any nausea, vomiting, constipation, or diarrhea.  She has no melena or hematochezia.  She has no urinary complaints.  Patient offers no further specific complaints today.  REVIEW OF SYSTEMS:   Review of Systems  Constitutional:  Positive for malaise/fatigue. Negative for fever and weight loss.  Respiratory: Negative.  Negative for cough, hemoptysis and shortness of breath.   Cardiovascular: Negative.  Negative for chest pain and leg swelling.  Gastrointestinal: Negative.  Negative for abdominal pain, blood in stool and melena.  Genitourinary: Negative.  Negative for hematuria.  Musculoskeletal: Negative.  Negative for back pain.  Skin: Negative.  Negative for rash.  Neurological: Negative.  Negative for dizziness, focal weakness, weakness and headaches.  Psychiatric/Behavioral: Negative.  The patient is not nervous/anxious.     As per HPI. Otherwise, a complete review of systems is negative.  PAST MEDICAL HISTORY: Past Medical History:  Diagnosis Date   Anemia    Breast cancer (HCC) 1989   Right mastectomy /pos node /chemo /Dr Cerame   Cardiomegaly    Colon polyp    Complication of  anesthesia    with colonoscopy   Coronary atherosclerosis of native coronary artery    COVID-19    DCIS (ductal carcinoma in situ) 08/19/2016   left breast   Diabetes mellitus without complication (HCC)    Dyspnea    GERD (gastroesophageal reflux disease)    Heart murmur    History of kidney stones    Hypothyroidism    Other and unspecified hyperlipidemia    Other chronic pulmonary heart diseases    Panic attacks    Pneumonia    covid PNA   PONV (postoperative nausea and vomiting)    Unspecified essential hypertension     PAST SURGICAL HISTORY: Past Surgical History:  Procedure Laterality Date   ABDOMINAL HYSTERECTOMY     BREAST BIOPSY Left 08/19/2016   DUCTAL CARCINOMA IN SITU (DCIS) INVOLVING A SCLEROSING LESION   CHOLECYSTECTOMY     COLONOSCOPY WITH PROPOFOL  N/A 04/03/2020   Procedure: COLONOSCOPY WITH PROPOFOL ;  Surgeon: Therisa Bi, MD;  Location: [facility] ENDOSCOPY;  Service: Gastroenterology;  Laterality: N/A;   COLONOSCOPY WITH PROPOFOL  N/A 05/13/2021   Procedure: COLONOSCOPY WITH PROPOFOL ;  Surgeon: Dessa Reyes ORN, MD;  Location: [facility] ENDOSCOPY;  Service: Endoscopy;  Laterality: N/A;   COLONOSCOPY WITH PROPOFOL  N/A 08/22/2023   Procedure: COLONOSCOPY WITH PROPOFOL ;  Surgeon: Therisa Bi, MD;  Location: [facility] ENDOSCOPY;  Service: Gastroenterology;  Laterality: N/A;   ESOPHAGOGASTRODUODENOSCOPY  08/22/2023   Procedure: EGD (ESOPHAGOGASTRODUODENOSCOPY);  Surgeon: Therisa Bi, MD;  Location: [facility] ENDOSCOPY;  Service: Gastroenterology;;   LAPAROSCOPIC RIGHT COLECTOMY Right 05/05/2020   Procedure: LAPAROSCOPIC RIGHT COLECTOMY;  Surgeon: Dessa Reyes ORN, MD;  Location: [facility] ORS;  Service: General;  Laterality: Right;  lumpectomy (otheR)     MASTECTOMY     both   SENTINEL NODE BIOPSY Left 09/13/2016   Procedure: SENTINEL NODE BIOPSY;  Surgeon: Reyes LELON Cota, MD;  Location: [facility] ORS;  Service: General;  Laterality: Left;   SIMPLE MASTECTOMY WITH AXILLARY SENTINEL NODE  BIOPSY Left 09/13/2016   Procedure: SIMPLE MASTECTOMY;  Surgeon: Reyes LELON Cota, MD;  Location: [facility] ORS;  Service: General;  Laterality: Left;    FAMILY HISTORY: Family History  Problem Relation Age of Onset   Breast cancer Sister     ADVANCED DIRECTIVES (Y/N):  N  HEALTH MAINTENANCE: Social History   Tobacco Use   Smoking status: Never   Smokeless tobacco: Never  Vaping Use   Vaping status: Never Used  Substance Use Topics   Alcohol use: No   Drug use: No     Colonoscopy:  PAP:  Bone density:  Lipid panel:  No Known Allergies  Current Outpatient Medications  Medication Sig Dispense Refill   ALPRAZolam (XANAX) 0.5 MG tablet Take 0.5 mg by mouth daily.     aspirin  EC 81 MG tablet Take 81 mg by mouth daily as needed (heart murmur). Swallow whole.     benzonatate  (TESSALON  PERLES) 100 MG capsule Take 1 capsule (100 mg total) by mouth 3 (three) times daily as needed for cough. 20 capsule 0   glimepiride (AMARYL) 2 MG tablet Take 2 mg by mouth 2 (two) times daily.     hydrochlorothiazide (MICROZIDE) 12.5 MG capsule Take by mouth.     levothyroxine  (SYNTHROID ) 75 MCG tablet Take 1 tablet (75 mcg total) by mouth daily. 30 tablet 0   linaclotide (LINZESS) 145 MCG CAPS capsule Take by mouth.     pantoprazole  (PROTONIX ) 40 MG tablet Take 1 tablet (40 mg total) by mouth daily. 30 tablet 0   PARoxetine (PAXIL) 10 MG tablet Take 10 mg by mouth daily.     pravastatin (PRAVACHOL) 40 MG tablet Take 40 mg by mouth at bedtime.     trandolapril  (MAVIK ) 4 MG tablet Take 1 tablet (4 mg total) by mouth daily. 30 tablet 0   traZODone  (DESYREL ) 50 MG tablet Take 50 mg by mouth at bedtime.      verapamil  (VERELAN  PM) 240 MG 24 hr capsule Take 240 mg by mouth at bedtime.      No current facility-administered medications for this visit.    OBJECTIVE: Vitals:   03/05/24 1529  BP: (!) 140/90  Pulse: (!) 54  Resp: 18  Temp: 97.9 F (36.6 C)  SpO2: 100%     Body mass index is 25.15  kg/m.    ECOG FS:0 - Asymptomatic  General: Well-developed, well-nourished, no acute distress. Eyes: Pink conjunctiva, anicteric sclera. HEENT: Normocephalic, moist mucous membranes. Lungs: No audible wheezing or coughing. Heart: Regular rate and rhythm. Abdomen: Soft, nontender, no obvious distention. Musculoskeletal: No edema, cyanosis, or clubbing. Neuro: Alert, answering all questions appropriately. Cranial nerves grossly intact. Skin: No rashes or petechiae noted. Psych: Normal affect. Lymphatics: No cervical, calvicular, axillary or inguinal LAD.   LAB RESULTS:  Lab Results  Component Value Date   NA 136 02/06/2024   K 3.8 02/06/2024   CL 106 02/06/2024   CO2 23 02/06/2024   GLUCOSE 99 02/06/2024   BUN 25 (H) 02/06/2024   CREATININE 1.20 (H) 02/06/2024   CALCIUM 9.3 02/06/2024   PROT 8.2 (H) 02/06/2024   ALBUMIN 4.2 02/06/2024   AST 19 02/06/2024   ALT 13 02/06/2024   ALKPHOS  68 02/06/2024   BILITOT 0.9 02/06/2024   GFRNONAA 45 (L) 02/06/2024   GFRAA 53 (L) 07/14/2019    Lab Results  Component Value Date   WBC 5.6 02/06/2024   NEUTROABS 3.1 02/06/2024   HGB 12.8 02/06/2024   HCT 38.8 02/06/2024   MCV 92.6 02/06/2024   PLT 186 02/06/2024   Lab Results  Component Value Date   IRON  58 02/06/2024   TIBC 385 02/06/2024   IRONPCTSAT 15 02/06/2024   Lab Results  Component Value Date   FERRITIN 87 02/06/2024     STUDIES: No results found.  ASSESSMENT: MGUS, iron  deficiency anemia.  PLAN:    MGUS: Patient has a mildly elevated M spike of 1.3.  Her IgG levels remain elevated ranging from 1759-2127 since December 2024.  Both kappa and lambda free light chains are elevated with an essentially normal light chain ratio.  She has a mild renal insufficiency with a creatinine of 1.2, but no other evidence of endorgan damage.  No intervention is needed.  Patient does not require bone marrow biopsy.  Return to clinic in 6 months with repeat laboratory work and  evaluation by APP. Iron  deficiency anemia: Resolved.  Patient's hemoglobin and iron  stores are within normal limits.  Colonoscopy and EGD in April 2025 did not reveal any significant pathology.  She does not require additional IV Venofer  today.  Patient last received treatment on June 06, 2023. History of colon cancer: Patient underwent partial colectomy on May 05, 2020 revealing a stage I colon cancer.  She did not require adjuvant chemotherapy.  Recent colonoscopy in April 2025 did not reveal any evidence of recurrence.  Continue follow-up with GI as scheduled.  Patient expressed understanding and was in agreement with this plan. She also understands that She can call clinic at any time with any questions, concerns, or complaints.    Evalene JINNY Reusing, MD   03/05/2024 4:00 PM

## 2024-03-12 DIAGNOSIS — R300 Dysuria: Secondary | ICD-10-CM | POA: Diagnosis not present

## 2024-03-12 DIAGNOSIS — I10 Essential (primary) hypertension: Secondary | ICD-10-CM | POA: Diagnosis not present

## 2024-03-12 DIAGNOSIS — E119 Type 2 diabetes mellitus without complications: Secondary | ICD-10-CM | POA: Diagnosis not present

## 2024-03-12 DIAGNOSIS — D649 Anemia, unspecified: Secondary | ICD-10-CM | POA: Diagnosis not present

## 2024-03-12 DIAGNOSIS — E039 Hypothyroidism, unspecified: Secondary | ICD-10-CM | POA: Diagnosis not present

## 2024-03-12 DIAGNOSIS — E782 Mixed hyperlipidemia: Secondary | ICD-10-CM | POA: Diagnosis not present

## 2024-03-12 DIAGNOSIS — Z1331 Encounter for screening for depression: Secondary | ICD-10-CM | POA: Diagnosis not present

## 2024-03-12 DIAGNOSIS — D472 Monoclonal gammopathy: Secondary | ICD-10-CM | POA: Diagnosis not present

## 2024-03-12 DIAGNOSIS — D509 Iron deficiency anemia, unspecified: Secondary | ICD-10-CM | POA: Diagnosis not present

## 2024-03-12 DIAGNOSIS — N1832 Chronic kidney disease, stage 3b: Secondary | ICD-10-CM | POA: Diagnosis not present

## 2024-03-12 NOTE — Progress Notes (Signed)
 Tammy Cannon is a 83 y.o. female here for  Chief Complaint Chief Complaint  Patient presents with   Annual Exam    Physical    HPI She has a history of multiple medical problems including breast cancer, history of colon cancer, , depression, diabetes, GERD, hypertension, hypothyroidism, kidney stones, , GERD, hyperlipidemia, insomnia Continues to work at 430 in morning, comes home at 3 pm and cooks for 8 people She had an ED visit in September with COVID and blood pressure was elevated. She is following with oncology for her B12 def, iron  def and  She has been found to have Bence-Jones protein's as well as MGUS.SABRA   Colonoscopy and EGD in April 2025 did not reveal any significant pathology.  Had iron  infusions in past and levels have been good recently.  For diabetes she takes glimepiride  1 mg twice a day.  Has been intolerant of met due to diarrhea.  Sugars are usually below 140.  Occasional lows. She has mild neuropathy was on gabapentin  and has seen podiatry. No longer on it.  She takes Synthroid  for her hypothyroidism.  She takes  Protonix  for GERD. She is on Pravachol  for hyperlipidemia. For dizziness carotids were nml  Background - still works in assembly. Lives alone, still drives.  Widowed. Has one daughter and on granddaughter. Tobacco- none, Etoh none  Problem List Patient Active Problem List  Diagnosis   Pulmonary hypertension (CMS/HHS-HCC)   Mixed hyperlipidemia   Cardiomegaly   Essential hypertension   Prediabetes   Chronic kidney disease (CKD), stage III (moderate) (CMS-HCC)   Iron  deficiency anemia   MGUS (monoclonal gammopathy of unknown significance)   Normocytic anemia    Past Medical History Past Medical History:  Diagnosis Date   Breast cancer (CMS/HHS-HCC) 08/19/2016   DCIS, simple mastectomy with sentinel node.  DCIS in a complex sclerosing lesion.   Breast cancer (CMS/HHS-HCC) 1989   Node positive by report, records not available.  Mastectomy.    Cardiomegaly    Colon cancer (CMS/HHS-HCC) 05/05/2020   Moderately differentiated invasive adenocarcinoma, 2.1 cm with additional foci of intramucosal adenocarcinoma.  Origin in a sessile serrated polyp.  0/25 nodes.  T1, N0   Complication of anesthesia    Coronary atherosclerosis of native coronary artery    COVID-19    Depression    Diabetes (CMS/HHS-HCC)    Ductal carcinoma in situ (DCIS) of left breast 08/26/2016   GERD (gastroesophageal reflux disease)    History of kidney stones    HLD (hyperlipidemia)    Hypertension    Hypothyroidism    Mitral valve insufficiency    Other specified pulmonary heart diseases (CMS/HHS-HCC)    Panic attacks    PONV (postoperative nausea and vomiting)    Radial scar of breast 08/26/2016   Renal lithiasis    Tricuspid valve insufficiency     Past Surgical History  Past Surgical History:  Procedure Laterality Date   COLONOSCOPY  05/03/2014   Polyp resected, not retrieved & FH Colon Polyps - repeat 5 years per Dr. Jeri   BREAST EXCISIONAL BIOPSY Left 08/19/2016   MASTECTOMY SIMPLE Left 09/13/2016   COLONOSCOPY  04/03/2020   lap right colectomy  05/05/2020   COLONOSCOPY  05/13/2021   Normal examined colon/PHx CP/Repeat 62yrs/[facility]   ABDOMINAL HYSTERECTOMY     CHOLECYSTECTOMY     MASTECTOMY SURGERY Right     Social History Social History   Socioeconomic History   Marital status: Widowed  Tobacco Use   Smoking status: Never  Smokeless tobacco: Never  Substance and Sexual Activity   Alcohol use: No   Drug use: No   Sexual activity: Not Currently    Birth control/protection: Surgical   Social Drivers of Health   Financial Resource Strain: Low Risk  (03/12/2024)   Overall Financial Resource Strain (CARDIA)    Difficulty of Paying Living Expenses: Not very hard  Food Insecurity: No Food Insecurity (03/12/2024)   Hunger Vital Sign    Worried About Running Out of Food in the Last Year: Never  true    Ran Out of Food in the Last Year: Never true  Transportation Needs: No Transportation Needs (03/12/2024)   PRAPARE - Administrator, Civil Service (Medical): No    Lack of Transportation (Non-Medical): No  Housing Stability: Low Risk  (03/12/2024)   Housing Stability Vital Sign    Unable to Pay for Housing in the Last Year: No    Number of Times Moved in the Last Year: 0    Homeless in the Last Year: No    Family History Family History  Problem Relation Name Age of Onset   Breast cancer Sister     Colon cancer Sister     High blood pressure (Hypertension) Other     Breast cancer Other      Medication   Medication List       * Accurate as of March 12, 2024 10:29 AM. If you have any questions, ask your nurse or doctor.          CONTINUE taking these medications    ACCU-CHEK GUIDE TEST STRIPS test strip Generic drug: blood glucose diagnostic once daily Use as instructed.   aspirin  81 mg Cap   cyanocobalamin  1000 MCG tablet Commonly known as: VITAMIN B12 Take 1 tablet (1,000 mcg total) by mouth once daily   glimepiride  1 MG tablet Commonly known as: AMARYL  Take 1 tablet (1 mg total) by mouth 2 (two) times daily   hydroCHLOROthiazide  12.5 MG tablet Commonly known as: HYDRODIURIL  Take 1 tablet (12.5 mg total) by mouth once daily   levothyroxine  75 MCG tablet Commonly known as: SYNTHROID  TAKE ONE TABLET (75 MCG TOTAL) BY MOUTH DAILY.   pantoprazole  40 MG DR tablet Commonly known as: PROTONIX  TAKE ONE TABLET (40 MG TOTAL) BY MOUTH ONCE DAILY   pravastatin  40 MG tablet Commonly known as: PRAVACHOL  Take 1 tablet (40 mg total) by mouth once daily   trandolapriL  4 MG tablet Commonly known as: MAVIK  TAKE ONE TABLET (4 MG TOTAL) BY MOUTH DAILY.   verapamiL  240 MG SR tablet Commonly known as: CALAN -SR Take 1 tablet (240 mg total) by mouth once daily        Allergy No Known Allergies  Review of System A comprehensive ROS  was negative except as aper HPI  Physical exam BP 120/62   Pulse 58   Ht 160 cm (5' 3)   Wt 64.4 kg (142 lb)   SpO2 98%   BMI 25.15 kg/m   GENERAL:  The patient is alert, oriented and in no acute distress.  HEENT:  PERRLA.  Oropharynx moist without lesions.  Head is normocephalic/atraumatic.  Pupils equal, round and reactive to light and accommodation.  Extraocular movements are intact. Nasal mucosa is normal.  External ear canals normal.  Tympanic membranes are clear.   NECK:  Supple without thyromegaly or lymphadenopathy.   LUNGS:  Clear to auscultation and percussion.   CARDIAC:  Regular rate and rhythm, normal S1 and S2  without murmurs, rubs or gallops.   VASCULAR: Carotid and radial pulses 2+.  Distal pulses are 2+.   ABDOMEN:  Soft, with normal bowel sounds.  No organomegaly or tenderness was found.   EXTREMITIES:  Full range of motion with no erythema, heat or effusions.  No cyanosis, clubbing or edema noted.   NEUROLOGIC:  The patient is alert and oriented.  Cranial nerves II-XII intact.  Motor and sensory examination within normal limits.   Gait normal.  Reflexes symmetric and brisk.   DERMATOLOGIC:  No skin lesions, scaling or bruising noted.    LYMPH:  No cervical or supraclavicular nodes.      Labs Appointment on 02/27/2024  Component Date Value Ref Range Status   Glucose 02/27/2024 86  70 - 110 mg/dL Final   Sodium 89/86/7974 140  136 - 145 mmol/L Final   Potassium 02/27/2024 4.3  3.6 - 5.1 mmol/L Final   Chloride 02/27/2024 103  97 - 109 mmol/L Final   Carbon Dioxide (CO2) 02/27/2024 27.7  22.0 - 32.0 mmol/L Final   Urea Nitrogen (BUN) 02/27/2024 16  7 - 25 mg/dL Final   Creatinine 89/86/7974 1.1  0.6 - 1.1 mg/dL Final   Glomerular Filtration Rate (eGFR) 02/27/2024 50 (L)  >60 mL/min/1.73sq m Final   Calcium  02/27/2024 9.5  8.7 - 10.3 mg/dL Final   AST  89/86/7974 17  8 - 39 U/L Final   ALT  02/27/2024 13  5 - 38 U/L Final   Alk Phos (alkaline  Phosphatase) 02/27/2024 79  34 - 104 U/L Final   Albumin 02/27/2024 4.3  3.5 - 4.8 g/dL Final   Bilirubin, Total 02/27/2024 1.4 (H)  0.3 - 1.2 mg/dL Final   Protein, Total 02/27/2024 7.4  6.1 - 7.9 g/dL Final   A/G Ratio 89/86/7974 1.4  1.0 - 5.0 gm/dL Final   Hemoglobin J8R 02/27/2024 6.9 (H)  4.2 - 5.6 % Final   Average Blood Glucose (Calc) 02/27/2024 151  mg/dL Final   Cholesterol, Total 02/27/2024 122  100 - 200 mg/dL Final   Triglyceride 89/86/7974 85  35 - 199 mg/dL Final   HDL (High Density Lipoprotein) Cho* 02/27/2024 54.4  35.0 - 85.0 mg/dL Final   LDL Calculated 02/27/2024 51  0 - 130 mg/dL Final   VLDL Cholesterol 02/27/2024 17  mg/dL Final   Cholesterol/HDL Ratio 02/27/2024 2.2   Final   Thyroid  Stimulating Hormone (TSH) 02/27/2024 3.026  0.450-5.330 uIU/ml uIU/mL Final   Creatinine, Random Urine 02/27/2024 69.2  37.0 - 250.0 mg/dL Final   Urine Albumin, Random 02/27/2024 64    mg/L Final   Urine Albumin/Creatinine Ratio 02/27/2024 92.5 (H)  <30.0 ug/mg Final    Assessment  Tammy Cannon is a 83 y.o. female here for comprehensive follow up of multiple medical problems.  Recommendations Dysuria- check UA  Intermittent dizziness-  improving- neg carotids.   Diabetes - sugars well controlled at home with current medications.  She has been on metformin  but had diarrhea.  Decrease glimiperide from 1 mg 1/2 bid to once a day Does have some lows.  We discussed risk of hypoglycemia with this regimen.  Lab Results  Component Value Date   HGBA1C 6.9 (H) 02/27/2024   HGBA1C 6.7 (H) 07/11/2023  On statin.  On ACE I Urine micro +- on ARB - consider jardiance  Hyperlipidemia - Tolerating statin with no myalgias.  Last LDL at goal and liver function tests stable. Continue current medications Lab Results  Component Value Date  LDLCALC 51 02/27/2024   Lab Results  Component Value Date   ALT 13 02/27/2024   AST 17 02/27/2024   ALKPHOS 79 02/27/2024     Hypertension - BP was well controlled at home on current meds trandolpril and verapamil  but elevated after we stopped hctz.. She is taking  hctz q 3 days Lab Results  Component Value Date   CREATININE 1.1 02/27/2024   BUN 16 02/27/2024   NA 140 02/27/2024   K 4.3 02/27/2024   CL 103 02/27/2024   CO2 27.7 02/27/2024    Hx colon cancer most recent: 2022 negative. Neg 08/2023   Hx breast cancer status postmastectomy send no further imaging needed.  No longer following with oncology.  Hypothyroidism -  Symptoms and exam stable.  Lab Results  Component Value Date   TSH 3.026 02/27/2024    CKD- avoid nsaids. Will  stop hctz CT done 2021 with nml renal  Last three GFR values Lab Results  Component Value Date   GFR 50 (L) 02/27/2024   GFR 41 (L) 07/11/2023   GFR 35 (L) 06/06/2023    Mild chronic anemia-borderline  ferritin and  low B12.  Colonoscopy EGD as above. Following with onc - s/p infusions and doing well  Lab Results  Component Value Date   WBC 7.1 07/11/2023   HGB 12.7 07/11/2023   HCT 39.9 07/11/2023   MCV 90.7 07/11/2023   PLT 184 07/11/2023   Insomnia- prn  trazodone  - goes to bed at 8 - wakes up at 130 and then can't go back to sleep. She gets early to go to work at 415.   MGUS - # IgG lambda MGUS, low risk -SPEP/IFE 1.2 g/dL IgG lambda protein. Kappa 31.5. Lambda 103.7. Kappa lambda ratio of 0.30. -She has chronic kidney disease stage III since 2020. Her creatinine is overall stable. Likely secondary to hypertension and diabetes. -She has anemia however she is severely iron  deficient too.    Alm SQUIBB.Epifanio, MD    *Some images could not be shown.

## 2024-06-04 NOTE — Progress Notes (Signed)
 History of Present Illness:   Tammy Cannon is a 84 y.o. female here for   Verbally consented to the use of AI for note-taking.   Chief Complaint  Patient presents with   Dizziness        Chest Pain    Left side of neck and radiates down to chest      History of Present Illness Tammy Cannon is an 84 year old female with hypertension and pulmonary hypertension who presents with chest pain. She is accompanied by her daughter.  She experiences chest pain radiating to her neck and down her arm, which has worsened over the past few weeks. The pain is not exacerbated by movement but is particularly bothersome at night, preventing sleep. Taking a baby aspirin  helps reduce the intensity. During these episodes, she can hear her heart beating hard.  Her blood pressure is often elevated, with home readings around 159/70 mmHg. She is on three medications for blood pressure, including verapamil  and trandolapril , but is unsure of the exact names and dosages of all her medications. One medication, taken every other day, affects her kidneys.  She reports shortness of breath and sometimes gasping for breath, which has been present for a while. This symptom has been present for a while, and she has not been seeing a pulmonologist for this condition.  In terms of her social history, she still works and needs to feel better in order to continue working.   Past Medical History:   Past Medical History:  Diagnosis Date   Breast cancer (CMS/HHS-HCC) 08/19/2016   DCIS, simple mastectomy with sentinel node.  DCIS in a complex sclerosing lesion.   Breast cancer (CMS/HHS-HCC) 1989   Node positive by report, records not available.  Mastectomy.   Cardiomegaly    Colon cancer (CMS/HHS-HCC) 05/05/2020   Moderately differentiated invasive adenocarcinoma, 2.1 cm with additional foci of intramucosal adenocarcinoma.  Origin in a sessile serrated polyp.  0/25 nodes.  T1, N0   Complication of anesthesia     Coronary atherosclerosis of native coronary artery    COVID-19    Depression    Diabetes (CMS/HHS-HCC)    Ductal carcinoma in situ (DCIS) of left breast 08/26/2016   GERD (gastroesophageal reflux disease)    History of kidney stones    HLD (hyperlipidemia)    Hypertension    Hypothyroidism    Mitral valve insufficiency    Other specified pulmonary heart diseases (CMS/HHS-HCC)    Panic attacks    PONV (postoperative nausea and vomiting)    Radial scar of breast 08/26/2016   Renal lithiasis    Tricuspid valve insufficiency     Past Surgical History:   Past Surgical History:  Procedure Laterality Date   COLONOSCOPY  05/03/2014   Polyp resected, not retrieved & FH Colon Polyps - repeat 5 years per Dr. Jeri   BREAST EXCISIONAL BIOPSY Left 08/19/2016   MASTECTOMY SIMPLE Left 09/13/2016   COLONOSCOPY  04/03/2020   lap right colectomy  05/05/2020   COLONOSCOPY  05/13/2021   Normal examined colon/PHx CP/Repeat 21yrs/[facility]   ABDOMINAL HYSTERECTOMY     CHOLECYSTECTOMY     MASTECTOMY SURGERY Right     Allergies:  No Known Allergies  Current Medications:   Prior to Admission medications  Medication Sig Taking? Last Dose  aspirin  81 mg Cap Take by mouth Yes Taking  blood glucose diagnostic (ACCU-CHEK GUIDE TEST STRIPS) test strip once daily Use as instructed. Yes Taking  clotrimazole-betamethasone (LOTRISONE) 1-0.05 % cream Apply topically 2 (  two) times daily *to apply to nails - for nail fungus. Twice a day. Light coat to nails. The topical cannot penetrate nail polish, please ensure you do not have nail polish in order to assist in effectiveness of application. Yes Taking  diclofenac (VOLTAREN) 1 % topical gel Apply 2 g topically 3 (three) times daily Yes Taking  glimepiride (AMARYL) 1 MG tablet Take 1 tablet (1 mg total) by mouth daily with breakfast Yes Taking  levothyroxine  (SYNTHROID ) 75 MCG tablet TAKE ONE TABLET (75 MCG TOTAL) BY MOUTH DAILY. Yes Taking   pantoprazole  (PROTONIX ) 40 MG DR tablet TAKE ONE TABLET (40 MG TOTAL) BY MOUTH ONCE DAILY Yes Taking  pravastatin (PRAVACHOL) 40 MG tablet TAKE ONE TABLET BY MOUTH ONCE A DAY Yes Taking  trandolapriL  (MAVIK ) 4 MG tablet Take 1 tablet (4 mg total) by mouth once daily Yes   verapamiL  (CALAN -SR) 240 MG SR tablet Take 1 tablet (240 mg total) by mouth once daily Yes Taking  cyanocobalamin  (VITAMIN B12) 1000 MCG tablet Take 1 tablet (1,000 mcg total) by mouth once daily    hydroCHLOROthiazide (HYDRODIURIL) 12.5 MG tablet Take 1 tablet (12.5 mg total) by mouth once daily      Family History:   Family History  Problem Relation Name Age of Onset   Breast cancer Sister     Colon cancer Sister     High blood pressure (Hypertension) Other     Breast cancer Other      Social History:   Social History   Socioeconomic History   Marital status: Widowed  Tobacco Use   Smoking status: Never   Smokeless tobacco: Never  Substance and Sexual Activity   Alcohol use: No   Drug use: No   Sexual activity: Not Currently    Birth control/protection: Surgical   Social Drivers of Health   Financial Resource Strain: Low Risk  (04/23/2024)   Overall Financial Resource Strain (CARDIA)    Difficulty of Paying Living Expenses: Not hard at all  Food Insecurity: No Food Insecurity (04/23/2024)   Hunger Vital Sign    Worried About Running Out of Food in the Last Year: Never true    Ran Out of Food in the Last Year: Never true  Transportation Needs: No Transportation Needs (04/23/2024)   PRAPARE - Administrator, Civil Service (Medical): No    Lack of Transportation (Non-Medical): No  Housing Stability: Low Risk  (06/01/2024)   Housing Stability Vital Sign    Unable to Pay for Housing in the Last Year: No    Number of Times Moved in the Last Year: 0    Homeless in the Last Year: No    Review of Systems:   A 10 point review of systems is negative, except for the pertinent  positives and negatives detailed in the HPI.  Vitals:   Vitals:   06/04/24 1400  BP: (!) 162/78  Pulse: 59  SpO2: 99%  Weight: 64.9 kg (143 lb)     Body mass index is 25.33 kg/m.  Physical Exam:   Physical Exam Vitals and nursing note reviewed.  Constitutional:      General: She is not in acute distress.    Appearance: Normal appearance. She is not ill-appearing, toxic-appearing or diaphoretic.  HENT:     Head: Normocephalic and atraumatic.     Right Ear: External ear normal.     Left Ear: External ear normal.  Eyes:     Conjunctiva/sclera: Conjunctivae normal.  Neck:  Vascular: No carotid bruit.  Cardiovascular:     Rate and Rhythm: Normal rate and regular rhythm.     Pulses: Normal pulses.     Heart sounds: Normal heart sounds.  Pulmonary:     Effort: Pulmonary effort is normal.     Breath sounds: Normal breath sounds.  Musculoskeletal:     Comments: No tenderness to palpation along the chest wall.  Normal range of motion in the shoulder without pain.  Neurological:     Mental Status: She is alert.     Assessment and Plan:   Results for orders placed or performed in visit on 06/04/24  ECG 12-lead  Result Value Ref Range   Vent Rate (bpm) 58    PR Interval (msec) 144    QRS Interval (msec) 84    QT Interval (msec) 476    QTc (msec) 467    Procedure: ECG   Name: Shelah Heatley Age: 84 y.o. Gender: female  Rate: 58 bpm Rhythm: Sinus bradycardia  No acute ST or T wave changes.   Interpretation: Sinus bradycardia EKG.  Some artifact noted in the EKG.  Electronically signed by Jonette Penning Minor, PA, June 04, 2024 at 2:55 PM   Assessment & Plan Atypical chest pain Intermittent chest pain with radiation, palpitations, and dizziness. EKG shows sinus bradycardia and ST abnormality. Differential includes cardiac etiology due to hypertension and pulmonary hypertension. - Ordered nuclear stress test. - Ordered chest x-ray. - Advised to go to  the emergency room if chest pain worsens or does not resolve. - EKG performed in clinic  Essential hypertension Blood pressure remains uncontrolled on current trandolapril  regimen. - Continue trandolapril  at 4 mg daily -Hydrochlorothiazide 12.5 mg has been added daily. - Ordered CBC, CMP, TSH - Advised to continue daily aspirin .  General health maintenance Cholesterol well controlled. Cardiovascular risk factors present. - Continue current cholesterol management regimen.  Disposition: Follow-up with PCP in 2 weeks  Patient Instructions  As discussed plan to follow-up with your PCP in a couple weeks.  I will increase your trandolapril  to 4 mg twice daily.  Take 1 in the morning 1 in the evening.  See if this controls your blood pressure better if it does not please follow-up.  Will get some labs and x-ray today make aware the results.  Reach out if you have any questions or concerns.   This note has been created using automated tools and reviewed for accuracy by provider.  Patient received an After Visit Summary    Attestation Statement:   I personally performed the service, non-incident to. (WP)   MASON MCCLELLAND MINOR, PA

## 2024-06-08 ENCOUNTER — Other Ambulatory Visit: Payer: Self-pay

## 2024-06-08 ENCOUNTER — Emergency Department

## 2024-06-08 ENCOUNTER — Observation Stay
Admission: EM | Admit: 2024-06-08 | Discharge: 2024-06-09 | Disposition: A | Attending: Emergency Medicine | Admitting: Emergency Medicine

## 2024-06-08 DIAGNOSIS — F419 Anxiety disorder, unspecified: Secondary | ICD-10-CM | POA: Insufficient documentation

## 2024-06-08 DIAGNOSIS — I2510 Atherosclerotic heart disease of native coronary artery without angina pectoris: Secondary | ICD-10-CM | POA: Diagnosis not present

## 2024-06-08 DIAGNOSIS — I2489 Other forms of acute ischemic heart disease: Secondary | ICD-10-CM | POA: Diagnosis not present

## 2024-06-08 DIAGNOSIS — R079 Chest pain, unspecified: Principal | ICD-10-CM

## 2024-06-08 DIAGNOSIS — R0789 Other chest pain: Principal | ICD-10-CM | POA: Diagnosis present

## 2024-06-08 DIAGNOSIS — I10 Essential (primary) hypertension: Secondary | ICD-10-CM | POA: Diagnosis not present

## 2024-06-08 DIAGNOSIS — Z7984 Long term (current) use of oral hypoglycemic drugs: Secondary | ICD-10-CM | POA: Insufficient documentation

## 2024-06-08 DIAGNOSIS — K449 Diaphragmatic hernia without obstruction or gangrene: Secondary | ICD-10-CM | POA: Insufficient documentation

## 2024-06-08 DIAGNOSIS — R5381 Other malaise: Secondary | ICD-10-CM

## 2024-06-08 DIAGNOSIS — Z853 Personal history of malignant neoplasm of breast: Secondary | ICD-10-CM | POA: Diagnosis not present

## 2024-06-08 DIAGNOSIS — I161 Hypertensive emergency: Secondary | ICD-10-CM

## 2024-06-08 DIAGNOSIS — E039 Hypothyroidism, unspecified: Secondary | ICD-10-CM | POA: Insufficient documentation

## 2024-06-08 DIAGNOSIS — Z79899 Other long term (current) drug therapy: Secondary | ICD-10-CM | POA: Insufficient documentation

## 2024-06-08 DIAGNOSIS — E119 Type 2 diabetes mellitus without complications: Secondary | ICD-10-CM | POA: Diagnosis not present

## 2024-06-08 DIAGNOSIS — E7800 Pure hypercholesterolemia, unspecified: Secondary | ICD-10-CM | POA: Diagnosis not present

## 2024-06-08 DIAGNOSIS — Z8616 Personal history of COVID-19: Secondary | ICD-10-CM | POA: Diagnosis not present

## 2024-06-08 LAB — CBC
HCT: 40 % (ref 36.0–46.0)
Hemoglobin: 13 g/dL (ref 12.0–15.0)
MCH: 31.3 pg (ref 26.0–34.0)
MCHC: 32.5 g/dL (ref 30.0–36.0)
MCV: 96.2 fL (ref 80.0–100.0)
Platelets: 190 K/uL (ref 150–400)
RBC: 4.16 MIL/uL (ref 3.87–5.11)
RDW: 13.8 % (ref 11.5–15.5)
WBC: 7.3 K/uL (ref 4.0–10.5)
nRBC: 0 % (ref 0.0–0.2)

## 2024-06-08 LAB — TROPONIN T, HIGH SENSITIVITY
Troponin T High Sensitivity: 54 ng/L — ABNORMAL HIGH (ref 0–19)
Troponin T High Sensitivity: 45 ng/L — ABNORMAL HIGH (ref 0–19)

## 2024-06-08 LAB — BASIC METABOLIC PANEL WITH GFR
Anion gap: 12 (ref 5–15)
BUN: 22 mg/dL (ref 8–23)
CO2: 23 mmol/L (ref 22–32)
Calcium: 10.1 mg/dL (ref 8.9–10.3)
Chloride: 104 mmol/L (ref 98–111)
Creatinine, Ser: 1.22 mg/dL — ABNORMAL HIGH (ref 0.44–1.00)
GFR, Estimated: 44 mL/min — ABNORMAL LOW
Glucose, Bld: 109 mg/dL — ABNORMAL HIGH (ref 70–99)
Potassium: 4.6 mmol/L (ref 3.5–5.1)
Sodium: 139 mmol/L (ref 135–145)

## 2024-06-08 MED ORDER — SODIUM CHLORIDE 0.9 % IV SOLN: INTRAVENOUS | Status: DC

## 2024-06-08 MED ORDER — LINACLOTIDE 145 MCG PO CAPS: 145.0000 ug | ORAL_CAPSULE | Freq: Every day | ORAL | Status: DC

## 2024-06-08 MED ORDER — PAROXETINE HCL 10 MG PO TABS: 10.0000 mg | ORAL_TABLET | Freq: Every day | ORAL | Status: DC

## 2024-06-08 MED ORDER — TRAZODONE HCL 50 MG PO TABS: 25.0000 mg | ORAL_TABLET | Freq: Every evening | ORAL | Status: DC | PRN

## 2024-06-08 MED ORDER — IOHEXOL 350 MG/ML SOLN
75.0000 mL | Freq: Once | INTRAVENOUS | Status: AC | PRN
  Administered 2024-06-08: 75 mL via INTRAVENOUS

## 2024-06-08 MED ORDER — LEVOTHYROXINE SODIUM 50 MCG PO TABS
75.0000 ug | ORAL_TABLET | Freq: Every day | ORAL | Status: DC
  Administered 2024-06-09: 75 ug via ORAL
  Filled 2024-06-08: qty 2

## 2024-06-08 MED ORDER — PRAVASTATIN SODIUM 20 MG PO TABS: 40.0000 mg | ORAL_TABLET | Freq: Every day | ORAL | Status: DC

## 2024-06-08 MED ORDER — ENOXAPARIN SODIUM 40 MG/0.4ML IJ SOSY
40.0000 mg | PREFILLED_SYRINGE | INTRAMUSCULAR | Status: DC
  Administered 2024-06-09: 40 mg via SUBCUTANEOUS
  Filled 2024-06-08: qty 0.4

## 2024-06-08 MED ORDER — PANTOPRAZOLE SODIUM 40 MG PO TBEC
40.0000 mg | DELAYED_RELEASE_TABLET | Freq: Every day | ORAL | Status: DC
  Administered 2024-06-09: 40 mg via ORAL
  Filled 2024-06-08: qty 1

## 2024-06-08 MED ORDER — ASPIRIN 81 MG PO TBEC: 81.0000 mg | DELAYED_RELEASE_TABLET | Freq: Every day | ORAL | Status: DC | PRN

## 2024-06-08 MED ORDER — TRAZODONE HCL 50 MG PO TABS: 50.0000 mg | ORAL_TABLET | Freq: Every day | ORAL | Status: DC

## 2024-06-08 MED ORDER — ONDANSETRON HCL 4 MG PO TABS: 4.0000 mg | ORAL_TABLET | Freq: Four times a day (QID) | ORAL | Status: DC | PRN

## 2024-06-08 MED ORDER — ACETAMINOPHEN 650 MG RE SUPP: 650.0000 mg | Freq: Four times a day (QID) | RECTAL | Status: DC | PRN

## 2024-06-08 MED ORDER — HYDROCHLOROTHIAZIDE 12.5 MG PO TABS
12.5000 mg | ORAL_TABLET | Freq: Every day | ORAL | Status: DC
  Administered 2024-06-09: 12.5 mg via ORAL
  Filled 2024-06-08: qty 1

## 2024-06-08 MED ORDER — VERAPAMIL HCL ER 240 MG PO TBCR: 240.0000 mg | ORAL_CAPSULE | Freq: Every day | ORAL | Status: DC

## 2024-06-08 MED ORDER — ONDANSETRON HCL 4 MG/2ML IJ SOLN: 4.0000 mg | Freq: Four times a day (QID) | INTRAMUSCULAR | Status: DC | PRN

## 2024-06-08 MED ORDER — BENZONATATE 100 MG PO CAPS: 100.0000 mg | ORAL_CAPSULE | Freq: Three times a day (TID) | ORAL | Status: DC | PRN

## 2024-06-08 MED ORDER — TRANDOLAPRIL 4 MG PO TABS
4.0000 mg | ORAL_TABLET | Freq: Every day | ORAL | Status: DC
  Filled 2024-06-08: qty 1

## 2024-06-08 MED ORDER — GLIMEPIRIDE 2 MG PO TABS
2.0000 mg | ORAL_TABLET | Freq: Two times a day (BID) | ORAL | Status: DC
  Administered 2024-06-09: 1 mg via ORAL
  Filled 2024-06-08: qty 0.5

## 2024-06-08 MED ORDER — ALPRAZOLAM 0.5 MG PO TABS: 0.5000 mg | ORAL_TABLET | Freq: Every day | ORAL | Status: DC

## 2024-06-08 MED ORDER — ACETAMINOPHEN 325 MG PO TABS
650.0000 mg | ORAL_TABLET | Freq: Four times a day (QID) | ORAL | Status: DC | PRN
  Administered 2024-06-09: 650 mg via ORAL
  Filled 2024-06-08: qty 2

## 2024-06-08 MED ORDER — MAGNESIUM HYDROXIDE 400 MG/5ML PO SUSP: 30.0000 mL | Freq: Every day | ORAL | Status: DC | PRN

## 2024-06-08 NOTE — ED Triage Notes (Signed)
 Pt to ED with daughter for sharp mid chest pain since 1 week ago. Saw PCP last week and had EKG. Pain has continued so was told to come to ED. Pt says pain radiates to upper back and is worse with inspiration.   Respirations unlabored, skin dry. Recently taken off ace inhibitor and put on hydrochlorothiazide  for HTN.

## 2024-06-08 NOTE — ED Provider Notes (Signed)
 "  [facility] Provider Note    Event Date/Time   First MD Initiated Contact with Patient 06/08/24 2038     (approximate)   History   Chest Pain   HPI  Tammy Cannon is a 84 y.o. female history of depression colon cancer hypertension kidney stones hypothyroidism  For about 2 weeks patient has been having some chest pains intermittently.  She saw her doctor and they have set her up for a nuclear stress test.  She reports she came in today because last night she had a fairly severe episode it feels like pain in the mid chest.  It feels sharp at times.  Radiates towards her back intermittently.  She reports she cannot get the stress test until February.  She is concerned though because she had a more severe episode of the pain last night.  Reviewed external records from PCP Dr. Epifanio October 27  Past Medical History:  Diagnosis Date   Anemia    Breast cancer [facility]) 1989   Right mastectomy /pos node dineen /Dr Cerame   Cardiomegaly    Colon polyp    Complication of anesthesia    with colonoscopy   Coronary atherosclerosis of native coronary artery    COVID-19    DCIS (ductal carcinoma in situ) 08/19/2016   left breast   Diabetes mellitus without complication (HCC)    Dyspnea    GERD (gastroesophageal reflux disease)    Heart murmur    History of kidney stones    Hypothyroidism    Other and unspecified hyperlipidemia    Other chronic pulmonary heart diseases    Panic attacks    Pneumonia    covid PNA   PONV (postoperative nausea and vomiting)    Renal disorder    CKD unknown stage   Unspecified essential hypertension    No fevers or chills no cough.  No nausea or vomiting.  No abdominal pain  Physical Exam   Triage Vital Signs: ED Triage Vitals  Encounter Vitals Group     BP 06/08/24 1801 (!) 196/84     Girls Systolic BP Percentile --      Girls Diastolic BP Percentile --      Boys Systolic BP Percentile --      Boys Diastolic BP  Percentile --      Pulse Rate 06/08/24 1801 73     Resp 06/08/24 1801 20     Temp 06/08/24 1801 98.3 F (36.8 C)     Temp Source 06/08/24 1801 Oral     SpO2 06/08/24 1801 98 %     Weight 06/08/24 1800 142 lb (64.4 kg)     Height 06/08/24 1800 5' 3 (1.6 m)     Head Circumference --      Peak Flow --      Pain Score 06/08/24 1758 9     Pain Loc --      Pain Education --      Exclude from Growth Chart --     Most recent vital signs: Vitals:   06/08/24 2035 06/08/24 2100  BP: (!) 158/78 (!) 189/81  Pulse: 65 64  Resp: 18 13  Temp: 97.9 F (36.6 C)   SpO2: 97% 97%     General: Awake, no distress.  CV:   Good peripheral perfusion. Normal rate and heart tones. Resp:   Normal effort. Lung sounds clear bilateral. Speaking without distress. Abd:   No distention. Soft, non-tender to palpation in all quadrants.  No rebound or guarding. Neuro:   No focal neuro deficits noted. Moves extremities well without noted concern. Other:  Presently resting comfortably.  Reports her chest pain was notably worse last night   ED Results / Procedures / Treatments   Labs (all labs ordered are listed, but only abnormal results are displayed) Labs Reviewed  BASIC METABOLIC PANEL WITH GFR - Abnormal; Notable for the following components:      Result Value   Glucose, Bld 109 (*)    Creatinine, Ser 1.22 (*)    GFR, Estimated 44 (*)    All other components within normal limits  TROPONIN T, HIGH SENSITIVITY - Abnormal; Notable for the following components:   Troponin T High Sensitivity 54 (*)    All other components within normal limits  TROPONIN T, HIGH SENSITIVITY - Abnormal; Notable for the following components:   Troponin T High Sensitivity 45 (*)    All other components within normal limits  CBC  BASIC METABOLIC PANEL WITH GFR  CBC   Abnormal troponin at 54!  Creatinine 1.2 GFR 44.  EKG  I independently reviewed the EKG at 1800 heart rate 75 QRS 80 QTc 440 Normal sinus rhythm, T  wave abnormality seen primarily in the lateral precordial leads with slight depression.  No STEMI.  Morphology appears fairly similar to previous   RADIOLOGY I independently reviewed images of chest x-ray inter by me is negative for acute    PROCEDURES:  Critical Care performed: No  Procedures   MEDICATIONS ORDERED IN ED: Medications  aspirin  EC tablet 81 mg (has no administration in time range)  hydrochlorothiazide  (MICROZIDE ) capsule 12.5 mg (has no administration in time range)  trandolapril  (MAVIK ) tablet 4 mg (has no administration in time range)  pravastatin  (PRAVACHOL ) tablet 40 mg (has no administration in time range)  verapamil  (VERELAN ) 24 hr capsule CP24 240 mg (has no administration in time range)  ALPRAZolam  (XANAX ) tablet 0.5 mg (has no administration in time range)  PARoxetine  (PAXIL ) tablet 10 mg (has no administration in time range)  traZODone  (DESYREL ) tablet 50 mg (has no administration in time range)  glimepiride  (AMARYL ) tablet 2 mg (has no administration in time range)  linaclotide  (LINZESS ) capsule 145 mcg (has no administration in time range)  levothyroxine  (SYNTHROID ) tablet 75 mcg (has no administration in time range)  pantoprazole  (PROTONIX ) EC tablet 40 mg (has no administration in time range)  benzonatate  (TESSALON ) capsule 100 mg (has no administration in time range)  enoxaparin  (LOVENOX ) injection 40 mg (has no administration in time range)  0.9 %  sodium chloride  infusion (has no administration in time range)  acetaminophen  (TYLENOL ) tablet 650 mg (has no administration in time range)    Or  acetaminophen  (TYLENOL ) suppository 650 mg (has no administration in time range)  traZODone  (DESYREL ) tablet 25 mg (has no administration in time range)  magnesium  hydroxide (MILK OF MAGNESIA) suspension 30 mL (has no administration in time range)  ondansetron  (ZOFRAN ) tablet 4 mg (has no administration in time range)    Or  ondansetron  (ZOFRAN ) injection 4  mg (has no administration in time range)  iohexol  (OMNIPAQUE ) 350 MG/ML injection 75 mL (75 mLs Intravenous Contrast Given 06/08/24 2226)     IMPRESSION / MDM / ASSESSMENT AND PLAN / ED COURSE  I reviewed the triage vital signs and the nursing notes.  Based on presentation, the differential diagnosis includes, but is not limited to key considerations: chest pain DIFFERENTIAL DIAGNOSIS CONSIDERATIONS: High-acuity etiologies particularly considered for rule-out: - Acute Coronary Syndrome (STEMI / NSTEMI / Unstable Angina) - Pulmonary Embolism - Aortic Dissection (Thoracic) - Tension Pneumothorax - Esophageal Rupture (Boerhaave's) - Cardiac Tamponade - Unstable Arrhythmia  This list is not exhaustive.   Patient's presentation is most consistent with acute complicated illness / injury requiring diagnostic workup.    The patient is on the cardiac monitor to evaluate for evidence of arrhythmia and/or significant heart rate changes.  Clinical Course as of 06/09/24 0002  [place] Jun 08, 2024  2339 Case discussed with patient accepted to hospital service under the care of Dr. Lawence.  Repeat troponin is downtrending which is reassuring and CT negative for PE.  However given that her most severe episode of pain was actually last evening query if this could be downtrending after episode of ACS.  She is currently resting comfortably.  Will admit to hospital service for further care and treatment.  Concern for potential moderate to high risk especially in the setting of a pending nuclear stress test ordered outpatient which has not yet been completed raising index of suspicion for potential underlying ACS or cardiac cause [MQ]    Clinical Course User Index [MQ] Dicky Anes, MD   Based on institutional policy troponin greater than 20 patient admitted for further care and treatment.  Reassuring somewhat that her next troponin is slightly downtrending but query if she may  have had episode of ACS perhaps even last night.  Resting comfortably at this time.   FINAL CLINICAL IMPRESSION(S) / ED DIAGNOSES   Final diagnoses:  Chest pain with high risk for cardiac etiology     Rx / DC Orders   ED Discharge Orders     None        Note:  This document was prepared using Dragon voice recognition software and may include unintentional dictation errors.   Dicky Anes, MD 06/09/24 0002  "

## 2024-06-08 NOTE — H&P (Incomplete)
 "     [place]   PATIENT NAME: Tammy Cannon    MR#:  [id number]  DATE OF BIRTH:  1941-01-12  DATE OF ADMISSION:  06/08/2024  PRIMARY CARE PHYSICIAN: Epifanio Alm SQUIBB, MD   Patient is coming from: Home  REQUESTING/REFERRING PHYSICIAN: Dicky Anes, MD  CHIEF COMPLAINT:   Chief Complaint  Patient presents with   Chest Pain    HISTORY OF PRESENT ILLNESS:  Tammy Cannon is a 84 y.o. female with medical history significant for GERD, hypothyroidism, type 2 diabetes mellitus and essential hypertension, who presented to the emergency room with acute onset of midsternal chest pain graded 9/10 in severity and felt as a sharp pain radiating to her back with associated nausea without vomiting.  She admitted to dyspnea and palpitations with her chest pain.  No cough or wheezing or hemoptysis.  No leg pain or edema or recent travels or surgeries.  No fever or chills.  ED Course: When the patient came to the ER, BP was 196/84 with otherwise normal vital signs.  Creatinine was 1.22 and BMP was normal.  High-sensitivity troponin T was 54 and later was 45.  CBC was within normal. EKG as reviewed by me : EKG showed normal sinus rhythm with rate of 75 with poor R wave progression. Imaging: 2 view chest x-ray showed moderate hiatal hernia and no acute findings. Chest CTA revealed stable moderate to large volume hiatal hernia with no pulmonary embolism.  The patient be admitted to a telemetry observation bed for further evaluation and management. PAST MEDICAL HISTORY:   Past Medical History:  Diagnosis Date   Anemia    Breast cancer (HCC) 1989   Right mastectomy /pos node /chemo /Dr Cerame   Cardiomegaly    Colon polyp    Complication of anesthesia    with colonoscopy   Coronary atherosclerosis of native coronary artery    COVID-19    DCIS (ductal carcinoma in situ) 08/19/2016   left breast   Diabetes mellitus without complication (HCC)    Dyspnea    GERD (gastroesophageal reflux  disease)    Heart murmur    History of kidney stones    Hypothyroidism    Other and unspecified hyperlipidemia    Other chronic pulmonary heart diseases    Panic attacks    Pneumonia    covid PNA   PONV (postoperative nausea and vomiting)    Renal disorder    CKD unknown stage   Unspecified essential hypertension     PAST SURGICAL HISTORY:   Past Surgical History:  Procedure Laterality Date   ABDOMINAL HYSTERECTOMY     APPENDECTOMY     BREAST BIOPSY Left 08/19/2016   DUCTAL CARCINOMA IN SITU (DCIS) INVOLVING A SCLEROSING LESION   CHOLECYSTECTOMY     COLONOSCOPY WITH PROPOFOL  N/A 04/03/2020   Procedure: COLONOSCOPY WITH PROPOFOL ;  Surgeon: Therisa Bi, MD;  Location: [facility] ENDOSCOPY;  Service: Gastroenterology;  Laterality: N/A;   COLONOSCOPY WITH PROPOFOL  N/A 05/13/2021   Procedure: COLONOSCOPY WITH PROPOFOL ;  Surgeon: Dessa Reyes ORN, MD;  Location: [facility] ENDOSCOPY;  Service: Endoscopy;  Laterality: N/A;   COLONOSCOPY WITH PROPOFOL  N/A 08/22/2023   Procedure: COLONOSCOPY WITH PROPOFOL ;  Surgeon: Therisa Bi, MD;  Location: [facility] ENDOSCOPY;  Service: Gastroenterology;  Laterality: N/A;   ESOPHAGOGASTRODUODENOSCOPY  08/22/2023   Procedure: EGD (ESOPHAGOGASTRODUODENOSCOPY);  Surgeon: Therisa Bi, MD;  Location: [facility] ENDOSCOPY;  Service: Gastroenterology;;   LAPAROSCOPIC RIGHT COLECTOMY Right 05/05/2020   Procedure: LAPAROSCOPIC RIGHT COLECTOMY;  Surgeon: Dessa Reyes  W, MD;  Location: [facility] ORS;  Service: General;  Laterality: Right;   lumpectomy (otheR)     MASTECTOMY     both   SENTINEL NODE BIOPSY Left 09/13/2016   Procedure: SENTINEL NODE BIOPSY;  Surgeon: Reyes LELON Cota, MD;  Location: [facility] ORS;  Service: General;  Laterality: Left;   SIMPLE MASTECTOMY WITH AXILLARY SENTINEL NODE BIOPSY Left 09/13/2016   Procedure: SIMPLE MASTECTOMY;  Surgeon: Reyes LELON Cota, MD;  Location: [facility] ORS;  Service: General;  Laterality: Left;    SOCIAL HISTORY:   Social History    Tobacco Use   Smoking status: Never   Smokeless tobacco: Never  Substance Use Topics   Alcohol use: No    FAMILY HISTORY:   Family History  Problem Relation Age of Onset   Breast cancer Sister     DRUG ALLERGIES:  Allergies[1]  REVIEW OF SYSTEMS:   ROS As per history of present illness. All pertinent systems were reviewed above. Constitutional, HEENT, cardiovascular, respiratory, GI, GU, musculoskeletal, neuro, psychiatric, endocrine, integumentary and hematologic systems were reviewed and are otherwise negative/unremarkable except for positive findings mentioned above in the HPI.   MEDICATIONS AT HOME:   Prior to Admission medications  Medication Sig Start Date End Date Taking? Authorizing Provider  ALPRAZolam  (XANAX ) 0.5 MG tablet Take 0.5 mg by mouth daily. 12/14/22   [provider]  aspirin  EC 81 MG tablet Take 81 mg by mouth daily as needed (heart murmur). Swallow whole.    [provider]  benzonatate  (TESSALON  PERLES) 100 MG capsule Take 1 capsule (100 mg total) by mouth 3 (three) times daily as needed for cough. 01/30/24   Levander Slate, MD  glimepiride  (AMARYL ) 2 MG tablet Take 2 mg by mouth 2 (two) times daily. 02/15/23   [provider]  hydrochlorothiazide  (MICROZIDE ) 12.5 MG capsule Take by mouth. 07/17/20   [provider]  levothyroxine  (SYNTHROID ) 75 MCG tablet Take 1 tablet (75 mcg total) by mouth daily. 02/20/23   Corlis Burnard DEL, NP  linaclotide  (LINZESS ) 145 MCG CAPS capsule Take by mouth.    [provider]  pantoprazole  (PROTONIX ) 40 MG tablet Take 1 tablet (40 mg total) by mouth daily. 02/20/23   Corlis Burnard DEL, NP  PARoxetine  (PAXIL ) 10 MG tablet Take 10 mg by mouth daily.    [provider]  pravastatin  (PRAVACHOL ) 40 MG tablet Take 40 mg by mouth at bedtime.    [provider]  trandolapril  (MAVIK ) 4 MG tablet Take 1 tablet (4 mg total) by mouth daily. 02/20/23   Corlis Burnard DEL, NP  traZODone  (DESYREL )  50 MG tablet Take 50 mg by mouth at bedtime.     [provider]  verapamil  (VERELAN  PM) 240 MG 24 hr capsule Take 240 mg by mouth at bedtime.     [provider]      VITAL SIGNS:  Blood pressure 138/79, pulse 61, temperature 98.2 F (36.8 C), temperature source Oral, resp. rate 16, height 5' 3 (1.6 m), weight 64.4 kg, SpO2 95%.  PHYSICAL EXAMINATION:  Physical Exam  GENERAL:  84 y.o.-year-old patient lying in the bed with no acute distress.  EYES: Pupils equal, round, reactive to light and accommodation. No scleral icterus. Extraocular muscles intact.  HEENT: Head atraumatic, normocephalic. Oropharynx and nasopharynx clear.  NECK:  Supple, no jugular venous distention. No thyroid  enlargement, no tenderness.  LUNGS: Normal breath sounds bilaterally, no wheezing, rales,rhonchi or crepitation. No use of accessory muscles of respiration.  CARDIOVASCULAR: Regular  rate and rhythm, S1, S2 normal. No murmurs, rubs, or gallops.  ABDOMEN: Soft, nondistended, nontender. Bowel sounds present. No organomegaly or mass.  EXTREMITIES: No pedal edema, cyanosis, or clubbing.  NEUROLOGIC: Cranial nerves II through XII are intact. Muscle strength 5/5 in all extremities. Sensation intact. Gait not checked.  PSYCHIATRIC: The patient is alert and oriented x 3.  Normal affect and good eye contact. SKIN: No obvious rash, lesion, or ulcer.   LABORATORY PANEL:   CBC Recent Labs  Lab 06/08/24 1802  WBC 7.3  HGB 13.0  HCT 40.0  PLT 190   ------------------------------------------------------------------------------------------------------------------  Chemistries  Recent Labs  Lab 06/08/24 1802  NA 139  K 4.6  CL 104  CO2 23  GLUCOSE 109*  BUN 22  CREATININE 1.22*  CALCIUM  10.1   ------------------------------------------------------------------------------------------------------------------  Cardiac Enzymes No results for input(s): TROPONINI in the last 168  hours. ------------------------------------------------------------------------------------------------------------------  RADIOLOGY:  CT Angio Chest PE W and/or Wo Contrast Result Date: 06/08/2024 EXAM: CTA of the Chest with contrast for PE 06/08/2024 10:35:03 PM TECHNIQUE: CTA of the chest was performed without and with the administration of 75 mL of iohexol  (OMNIPAQUE ) 350 MG/ML injection. Multiplanar reformatted images are provided for review. MIP images are provided for review. Automated exposure control, iterative reconstruction, and/or weight based adjustment of the mA/kV was utilized to reduce the radiation dose to as low as reasonably achievable. COMPARISON: Comparison from 04/16/2020 CLINICAL HISTORY: Pulmonary embolism (PE) suspected, high prob. Pulmonary embolism suspected, high probability. FINDINGS: PULMONARY ARTERIES: Pulmonary arteries are adequately opacified for evaluation. No pulmonary embolism. Main pulmonary artery is normal in caliber. MEDIASTINUM: The heart and pericardium demonstrate no acute abnormality. Right main coronary artery calcification. Moderate atherosclerotic plaque. Aorta is normal in caliber. Stable Moderate to large volume hiatal hernia. LYMPH NODES: No mediastinal, hilar or axillary lymphadenopathy. LUNGS AND PLEURA: The lungs are without acute process. No focal consolidation or pulmonary edema. No pleural effusion or pneumothorax. UPPER ABDOMEN: Low-density lesion at the right kidney likely represents a simple renal cyst. Simple renal cysts do not require additional follow-up unless clinically indicated due to signs/symptoms. Partially visualized colon resection. SOFT TISSUES AND BONES: Diffusely decreased bone density. Exaggerated kyphotic curvature centered at the T11 level in the setting of chronic compression anterior wedge compression fracture. Chronic compression fracture of the T2 levels. No acute soft tissue abnormality. Likely prior left mastectomy. IMPRESSION:  1. No pulmonary embolism. 2. Stable moderate to large volume hiatal hernia. Electronically signed by: Morgane Naveau MD 06/08/2024 10:57 PM EST RP Workstation: [id number]   DG Chest 2 View Result Date: 06/08/2024 EXAM: 2 VIEW(S) XRAY OF THE CHEST 06/08/2024 06:17:00 PM COMPARISON: 01/30/2024 CLINICAL HISTORY: Chest pain. FINDINGS: LUNGS AND PLEURA: No focal pulmonary opacity. No pleural effusion. No pneumothorax. HEART AND MEDIASTINUM: Moderate hiatal hernia. No acute abnormality of the cardiac silhouette. BONES AND SOFT TISSUES: Stable chronic lower thoracic vertebral body compression fracture. Degenerative changes of the spine and shoulders. Surgical clips in the right upper quadrant. IMPRESSION: 1. No acute findings. 2. Moderate hiatal hernia. Electronically signed by: Elsie Gravely MD 06/08/2024 06:28 PM EST RP Workstation: [id number]      IMPRESSION AND PLAN:  Assessment and Plan: * Chest pain - The patient will be admitted to an observation cardiac telemetry bed. - Will follow serial troponins and EKGs. - The patient will be placed on aspirin  as well as p.r.n. sublingual nitroglycerin  and morphine  sulfate for pain. - We will obtain a cardiology consult in a.m. for further cardiac risk stratification. -  I notified [facility] group about the patient.   Type 2 diabetes mellitus without complications (HCC) - Will place the patient on supplemental coverage with NovoLog . - Will continue Amaryl .  Anxiety - Will continue Xanax .  Hypothyroidism - Will continue Synthroid .  Essential hypertension - Will continue antihypertensive therapy.   DVT prophylaxis: Lovenox . Advanced Care Planning:  Code Status: The patient is DNR and DNI.  This was discussed with her.. Family Communication:  The plan of care was discussed in details with the patient (and family). I answered all questions. The patient agreed to proceed with the above mentioned plan. Further management will depend upon hospital  course. Disposition Plan: Back to previous home environment Consults called: Cardiology. All the records are reviewed and case discussed with ED provider.  Status is: Observation  I certify that at the time of admission, it is my clinical judgment that the patient will require hospital care extending less than 2 midnights.                            Dispo: The patient is from: Home              Anticipated d/c is to: Home              Patient currently is not medically stable to d/c.              Difficult to place patient: No  [place] DELENA Peaches M.D on 06/09/2024 at 4:25 AM  [facility]   From 7 PM-7 AM, contact night-coverage [email or web address]  CC: Primary care physician; Epifanio Alm SQUIBB, MD     [1] No Known Allergies  "

## 2024-06-09 ENCOUNTER — Encounter: Payer: Self-pay | Admitting: Internal Medicine

## 2024-06-09 ENCOUNTER — Observation Stay: Admit: 2024-06-09 | Discharge: 2024-06-09 | Disposition: A

## 2024-06-09 ENCOUNTER — Other Ambulatory Visit: Payer: Self-pay

## 2024-06-09 DIAGNOSIS — R0789 Other chest pain: Secondary | ICD-10-CM | POA: Diagnosis not present

## 2024-06-09 DIAGNOSIS — E119 Type 2 diabetes mellitus without complications: Secondary | ICD-10-CM

## 2024-06-09 DIAGNOSIS — E7800 Pure hypercholesterolemia, unspecified: Secondary | ICD-10-CM

## 2024-06-09 DIAGNOSIS — K449 Diaphragmatic hernia without obstruction or gangrene: Secondary | ICD-10-CM | POA: Diagnosis not present

## 2024-06-09 DIAGNOSIS — R079 Chest pain, unspecified: Secondary | ICD-10-CM | POA: Diagnosis not present

## 2024-06-09 DIAGNOSIS — E039 Hypothyroidism, unspecified: Secondary | ICD-10-CM | POA: Insufficient documentation

## 2024-06-09 DIAGNOSIS — F419 Anxiety disorder, unspecified: Secondary | ICD-10-CM | POA: Insufficient documentation

## 2024-06-09 DIAGNOSIS — I161 Hypertensive emergency: Secondary | ICD-10-CM | POA: Diagnosis not present

## 2024-06-09 DIAGNOSIS — I2489 Other forms of acute ischemic heart disease: Secondary | ICD-10-CM

## 2024-06-09 LAB — CBC
HCT: 34.9 % — ABNORMAL LOW (ref 36.0–46.0)
Hemoglobin: 11.6 g/dL — ABNORMAL LOW (ref 12.0–15.0)
MCH: 31 pg (ref 26.0–34.0)
MCHC: 33.2 g/dL (ref 30.0–36.0)
MCV: 93.3 fL (ref 80.0–100.0)
Platelets: 165 10*3/uL (ref 150–400)
RBC: 3.74 MIL/uL — ABNORMAL LOW (ref 3.87–5.11)
RDW: 13.8 % (ref 11.5–15.5)
WBC: 6.5 10*3/uL (ref 4.0–10.5)
nRBC: 0 % (ref 0.0–0.2)

## 2024-06-09 LAB — BASIC METABOLIC PANEL WITH GFR
Anion gap: 11 (ref 5–15)
BUN: 21 mg/dL (ref 8–23)
CO2: 23 mmol/L (ref 22–32)
Calcium: 9.1 mg/dL (ref 8.9–10.3)
Chloride: 104 mmol/L (ref 98–111)
Creatinine, Ser: 1.06 mg/dL — ABNORMAL HIGH (ref 0.44–1.00)
GFR, Estimated: 52 mL/min — ABNORMAL LOW
Glucose, Bld: 129 mg/dL — ABNORMAL HIGH (ref 70–99)
Potassium: 4.1 mmol/L (ref 3.5–5.1)
Sodium: 138 mmol/L (ref 135–145)

## 2024-06-09 LAB — ECHOCARDIOGRAM COMPLETE
AR max vel: 2.18 cm2
AV Peak grad: 8.5 mmHg
Ao pk vel: 1.46 m/s
Area-P 1/2: 2.2 cm2
Height: 63 in
S' Lateral: 1.7 cm
Weight: 2272 [oz_av]

## 2024-06-09 LAB — TROPONIN T, HIGH SENSITIVITY
Troponin T High Sensitivity: 48 ng/L — ABNORMAL HIGH (ref 0–19)
Troponin T High Sensitivity: 47 ng/L — ABNORMAL HIGH (ref 0–19)
Troponin T High Sensitivity: 48 ng/L — ABNORMAL HIGH (ref 0–19)

## 2024-06-09 LAB — SEDIMENTATION RATE: Sed Rate: 17 mm/h (ref 0–30)

## 2024-06-09 MED ORDER — HYDRALAZINE HCL 20 MG/ML IJ SOLN
10.0000 mg | Freq: Four times a day (QID) | INTRAMUSCULAR | Status: DC | PRN
  Administered 2024-06-09: 10 mg via INTRAVENOUS
  Filled 2024-06-09: qty 1

## 2024-06-09 MED ORDER — LABETALOL HCL 5 MG/ML IV SOLN: 20.0000 mg | INTRAVENOUS | Status: DC | PRN

## 2024-06-09 MED ORDER — ALUM & MAG HYDROXIDE-SIMETH 200-200-20 MG/5ML PO SUSP
15.0000 mL | ORAL | 0 refills | Status: DC | PRN
  Filled 2024-06-09: qty 355, 4d supply, fill #0

## 2024-06-09 MED ORDER — AMLODIPINE BESYLATE 5 MG PO TABS
5.0000 mg | ORAL_TABLET | Freq: Every day | ORAL | 0 refills | Status: DC
  Filled 2024-06-09: qty 30, 30d supply, fill #0

## 2024-06-09 MED ORDER — AMLODIPINE BESYLATE 5 MG PO TABS
5.0000 mg | ORAL_TABLET | Freq: Every day | ORAL | Status: DC
  Administered 2024-06-09: 5 mg via ORAL
  Filled 2024-06-09: qty 1

## 2024-06-09 MED ORDER — PANTOPRAZOLE SODIUM 40 MG PO TBEC: 40.0000 mg | DELAYED_RELEASE_TABLET | Freq: Two times a day (BID) | ORAL | 0 refills | Status: AC

## 2024-06-09 MED ORDER — AMLODIPINE BESYLATE 5 MG PO TABS: 5.0000 mg | ORAL_TABLET | Freq: Every day | ORAL | 0 refills | Status: AC

## 2024-06-09 MED ORDER — PANTOPRAZOLE SODIUM 40 MG PO TBEC
40.0000 mg | DELAYED_RELEASE_TABLET | Freq: Two times a day (BID) | ORAL | 0 refills | Status: DC
  Filled 2024-06-09: qty 30, 15d supply, fill #0

## 2024-06-09 MED ORDER — CALCIUM CARBONATE ANTACID 500 MG PO CHEW
1.0000 | CHEWABLE_TABLET | Freq: Two times a day (BID) | ORAL | 0 refills | Status: DC | PRN
  Filled 2024-06-09: qty 30, 10d supply, fill #0

## 2024-06-09 MED ORDER — IRBESARTAN 300 MG PO TABS
300.0000 mg | ORAL_TABLET | Freq: Every day | ORAL | 0 refills | Status: DC
  Filled 2024-06-09: qty 30, 30d supply, fill #0

## 2024-06-09 MED ORDER — DICLOFENAC SODIUM 1 % EX GEL: 2.0000 g | Freq: Four times a day (QID) | CUTANEOUS | 0 refills | Status: AC

## 2024-06-09 MED ORDER — CALCIUM CARBONATE ANTACID 500 MG PO CHEW
1.0000 | CHEWABLE_TABLET | Freq: Two times a day (BID) | ORAL | Status: DC
  Administered 2024-06-09: 200 mg via ORAL
  Filled 2024-06-09: qty 1

## 2024-06-09 MED ORDER — DICLOFENAC SODIUM 1 % EX GEL
2.0000 g | Freq: Four times a day (QID) | CUTANEOUS | Status: DC
  Filled 2024-06-09: qty 100

## 2024-06-09 MED ORDER — ALUM & MAG HYDROXIDE-SIMETH 200-200-20 MG/5ML PO SUSP
15.0000 mL | ORAL | Status: DC | PRN
  Administered 2024-06-09: 15 mL via ORAL
  Filled 2024-06-09: qty 30

## 2024-06-09 MED ORDER — CALCIUM CARBONATE ANTACID 500 MG PO CHEW: 1.0000 | CHEWABLE_TABLET | Freq: Two times a day (BID) | ORAL | 0 refills | Status: AC | PRN

## 2024-06-09 MED ORDER — NITROGLYCERIN 0.4 MG SL SUBL: 0.4000 mg | SUBLINGUAL_TABLET | SUBLINGUAL | Status: DC | PRN

## 2024-06-09 MED ORDER — MORPHINE SULFATE (PF) 2 MG/ML IV SOLN: 2.0000 mg | INTRAVENOUS | Status: DC | PRN

## 2024-06-09 MED ORDER — ASPIRIN 81 MG PO TBEC
81.0000 mg | DELAYED_RELEASE_TABLET | Freq: Every day | ORAL | Status: DC
  Administered 2024-06-09: 81 mg via ORAL
  Filled 2024-06-09: qty 1

## 2024-06-09 MED ORDER — ALUM & MAG HYDROXIDE-SIMETH 200-200-20 MG/5ML PO SUSP: 15.0000 mL | ORAL | 0 refills | Status: AC | PRN

## 2024-06-09 MED ORDER — IRBESARTAN 150 MG PO TABS
300.0000 mg | ORAL_TABLET | Freq: Every day | ORAL | Status: DC
  Filled 2024-06-09: qty 2

## 2024-06-09 MED ORDER — IRBESARTAN 300 MG PO TABS: 300.0000 mg | ORAL_TABLET | Freq: Every day | ORAL | 0 refills | Status: AC

## 2024-06-09 MED ORDER — DICLOFENAC SODIUM 1 % EX GEL
2.0000 g | Freq: Four times a day (QID) | CUTANEOUS | 0 refills | Status: DC
  Filled 2024-06-09: qty 2, fill #0

## 2024-06-09 NOTE — Consult Note (Signed)
 "  Cardiology Consultation:   Patient ID: Tammy Cannon; [id number]; 09/09/1940   Admit date: 06/08/2024 Date of Consult: 06/09/2024  Primary Care Provider: Epifanio Alm SQUIBB, MD Primary Cardiologist: New - consult by Dr. Raford, MD3 Primary Electrophysiologist:  None   Patient Profile:   MARGREE Cannon is a 84 y.o. female with a hx of coronary artery calcification noted on CT imaging, breast cancer status post right mastectomy, DM2, HTN, iron  deficiency anemia, MGUS, and hypothyroidism who is being seen today for the evaluation of elevated troponin at the request of Dr. Lawence.  History of Present Illness:   [name] was previously evaluated in the remote past by Dr. Fernand, with further details unavailable for review.  She does not believe she required any cardiac intervention.   She was recently evaluated by her PCP on 06/04/2024 reporting chest pain that radiated to her neck and down her arm that was worse over the preceding several weeks, bothersome at night with recommendation to pursue outpatient stress test through PCP's office that has not yet been performed.   She presented to [facility] on 06/08/2024 with a 2 to 3-week history of sharp chest pain that radiates to the left side of the neck and into the left shoulder.  Pain is worse when laying down or if she is stressed.  There is some associated shortness of breath and a heart pounding sensation.  No dizziness, presyncope, or syncope.  No recent illnesses.  Has never had pain similar.  No lower extremity swelling or orthopnea.  Upon arrival she was hypertensive with a blood pressure of 196/84, heart rate in the 50s to 70s bpm, oxygen saturation 98% on room air, and afebrile.  EKG showed NSR, 75 bpm, no acute ST-T changes.  Initial and peak high-sensitivity troponin 54, subsequently flat trending ranging from 45-48.  Initial CBC unremarkable with hemoglobin of 13 trending to 11.6 this morning.  Serum creatinine 1.22 trending to 1.06.  Chest  x-ray showed a moderate hiatal hernia.  CTA chest negative for PE with stable moderate to large hiatal hernia.  Incidentally noted was coronary artery calcification and aortic atherosclerosis.  In the ER she received IV fluids, hydralazine  10 mg x 1, and levothyroxine .  Cardiology is consulted for further evaluation.  Currently, continues to note left-sided sharp chest discomfort that is worse when laying down and improves somewhat, though does not resolve when sitting up.    Past Medical History:  Diagnosis Date   Anemia    Breast cancer (HCC) 1989   Right mastectomy /pos node dineen /Dr Cerame   Cardiomegaly    Colon polyp    Complication of anesthesia    with colonoscopy   Coronary atherosclerosis of native coronary artery    COVID-19    DCIS (ductal carcinoma in situ) 08/19/2016   left breast   Diabetes mellitus without complication (HCC)    Dyspnea    GERD (gastroesophageal reflux disease)    Heart murmur    History of kidney stones    Hypothyroidism    Other and unspecified hyperlipidemia    Other chronic pulmonary heart diseases    Panic attacks    Pneumonia    covid PNA   PONV (postoperative nausea and vomiting)    Renal disorder    CKD unknown stage   Unspecified essential hypertension     Past Surgical History:  Procedure Laterality Date   ABDOMINAL HYSTERECTOMY     APPENDECTOMY     BREAST BIOPSY Left 08/19/2016  DUCTAL CARCINOMA IN SITU (DCIS) INVOLVING A SCLEROSING LESION   CHOLECYSTECTOMY     COLONOSCOPY WITH PROPOFOL  N/A 04/03/2020   Procedure: COLONOSCOPY WITH PROPOFOL ;  Surgeon: Therisa Bi, MD;  Location: [facility] ENDOSCOPY;  Service: Gastroenterology;  Laterality: N/A;   COLONOSCOPY WITH PROPOFOL  N/A 05/13/2021   Procedure: COLONOSCOPY WITH PROPOFOL ;  Surgeon: Dessa Reyes ORN, MD;  Location: [facility] ENDOSCOPY;  Service: Endoscopy;  Laterality: N/A;   COLONOSCOPY WITH PROPOFOL  N/A 08/22/2023   Procedure: COLONOSCOPY WITH PROPOFOL ;  Surgeon: Therisa Bi, MD;   Location: [facility] ENDOSCOPY;  Service: Gastroenterology;  Laterality: N/A;   ESOPHAGOGASTRODUODENOSCOPY  08/22/2023   Procedure: EGD (ESOPHAGOGASTRODUODENOSCOPY);  Surgeon: Therisa Bi, MD;  Location: [facility] ENDOSCOPY;  Service: Gastroenterology;;   LAPAROSCOPIC RIGHT COLECTOMY Right 05/05/2020   Procedure: LAPAROSCOPIC RIGHT COLECTOMY;  Surgeon: Dessa Reyes ORN, MD;  Location: [facility] ORS;  Service: General;  Laterality: Right;   lumpectomy (otheR)     MASTECTOMY     both   SENTINEL NODE BIOPSY Left 09/13/2016   Procedure: SENTINEL NODE BIOPSY;  Surgeon: Reyes ORN Dessa, MD;  Location: [facility] ORS;  Service: General;  Laterality: Left;   SIMPLE MASTECTOMY WITH AXILLARY SENTINEL NODE BIOPSY Left 09/13/2016   Procedure: SIMPLE MASTECTOMY;  Surgeon: Reyes ORN Dessa, MD;  Location: [facility] ORS;  Service: General;  Laterality: Left;     Home Meds: Prior to Admission medications  Medication Sig Start Date End Date Taking? Authorizing Provider  aspirin  EC 81 MG tablet Take 81 mg by mouth daily as needed (heart murmur). Swallow whole.   Yes [provider]  benzonatate  (TESSALON  PERLES) 100 MG capsule Take 1 capsule (100 mg total) by mouth 3 (three) times daily as needed for cough. 01/30/24  Yes Ray, Nilsa, MD  glimepiride  (AMARYL ) 2 MG tablet Take 1 mg by mouth daily with breakfast. 02/15/23  Yes [provider]  hydrochlorothiazide  (MICROZIDE ) 12.5 MG capsule Take by mouth. 07/17/20  Yes [provider]  levothyroxine  (SYNTHROID ) 75 MCG tablet Take 1 tablet (75 mcg total) by mouth daily. 02/20/23  Yes Corlis Burnard DEL, NP  losartan (COZAAR) 100 MG tablet Take 100 mg by mouth daily. 06/06/24 06/06/25 Yes [provider]  pantoprazole  (PROTONIX ) 40 MG tablet Take 1 tablet (40 mg total) by mouth daily. 02/20/23  Yes Corlis Burnard DEL, NP  pravastatin  (PRAVACHOL ) 40 MG tablet Take 40 mg by mouth at bedtime.   Yes [provider]  trandolapril  (MAVIK ) 4 MG tablet Take 1 tablet  (4 mg total) by mouth daily. 02/20/23  Yes Corlis Burnard DEL, NP  traZODone  (DESYREL ) 50 MG tablet Take 50 mg by mouth at bedtime.    Yes [provider]  verapamil  (VERELAN  PM) 240 MG 24 hr capsule Take 240 mg by mouth at bedtime.    Yes [provider]  ALPRAZolam  (XANAX ) 0.5 MG tablet Take 0.5 mg by mouth daily. Patient not taking: Reported on 06/09/2024 12/14/22   [provider]  linaclotide  (LINZESS ) 145 MCG CAPS capsule Take by mouth. Patient not taking: Reported on 06/09/2024    [provider]  PARoxetine  (PAXIL ) 10 MG tablet Take 10 mg by mouth daily. Patient not taking: Reported on 06/09/2024    [provider]    Inpatient Medications: Scheduled Meds:  enoxaparin  (LOVENOX ) injection  40 mg Subcutaneous Q24H   glimepiride   1 mg Oral Q breakfast   hydrochlorothiazide   12.5 mg Oral Daily   levothyroxine   75 mcg Oral Q0600   pantoprazole   40 mg Oral Daily  pravastatin   40 mg Oral QHS   trandolapril   4 mg Oral Daily   traZODone   50 mg Oral QHS   verapamil   240 mg Oral QHS   Continuous Infusions:  sodium chloride  100 mL/hr at 06/09/24 0103   PRN Meds: acetaminophen  **OR** acetaminophen , aspirin  EC, hydrALAZINE , labetalol , magnesium  hydroxide, morphine  injection, nitroGLYCERIN , ondansetron  **OR** ondansetron  (ZOFRAN ) IV, traZODone   Allergies:  Allergies[1]  Social History:   Social History   Socioeconomic History   Marital status: Widowed    Spouse name: Not on file   Number of children: Not on file   Years of education: Not on file   Highest education level: Not on file  Occupational History   Not on file  Tobacco Use   Smoking status: Never   Smokeless tobacco: Never  Vaping Use   Vaping status: Never Used  Substance and Sexual Activity   Alcohol use: No   Drug use: No   Sexual activity: Not on file  Other Topics Concern   Not on file  Social History Narrative    Works 10 hr a day, 4 days a week, lives at home alone and  drives   Social Drivers of Health   Tobacco Use: Low Risk (06/08/2024)   Patient History    Smoking Tobacco Use: Never    Smokeless Tobacco Use: Never    Passive Exposure: Not on file  Financial Resource Strain: Low Risk  (04/23/2024)   Received from [facility]   Overall Financial Resource Strain (CARDIA)    Difficulty of Paying Living Expenses: Not hard at all  Food Insecurity: No Food Insecurity (04/23/2024)   Received from [facility]   Epic    Within the past 12 months, you worried that your food would run out before you got the money to buy more.: Never true    Within the past 12 months, the food you bought just didn't last and you didn't have money to get more.: Never true  Transportation Needs: No Transportation Needs (04/23/2024)   Received from [facility] - Transportation    In the past 12 months, has lack of transportation kept you from medical appointments or from getting medications?: No    Lack of Transportation (Non-Medical): No  Physical Activity: Not on file  Stress: Not on file  Social Connections: Not on file  Intimate Partner Violence: Not At Risk (04/22/2023)   Humiliation, Afraid, Rape, and Kick questionnaire    Fear of Current or Ex-Partner: No    Emotionally Abused: No    Physically Abused: No    Sexually Abused: No  Depression (PHQ2-9): Low Risk (04/22/2023)   Depression (PHQ2-9)    PHQ-2 Score: 0  Alcohol Screen: Not on file  Housing: Low Risk  (06/01/2024)   Received from [facility]   Epic    In the last 12 months, was there a time when you were not able to pay the mortgage or rent on time?: No    In the past 12 months, how many times have you moved where you were living?: 0    At any time in the past 12 months, were you homeless or living in a shelter (including now)?: No  Utilities: Not At Risk (04/23/2024)   Received from [facility]   Epic    In the  past 12 months has the electric, gas, oil, or water company threatened to shut off services in your  home?: No  Health Literacy: Not on file     Family History:   Family History  Problem Relation Age of Onset   Breast cancer Sister     ROS:  Review of Systems  Constitutional:  Negative for chills, diaphoresis, fever, malaise/fatigue and weight loss.  HENT:  Negative for congestion.   Eyes:  Negative for discharge and redness.  Respiratory:  Positive for shortness of breath. Negative for cough, sputum production and wheezing.   Cardiovascular:  Positive for chest pain and palpitations. Negative for orthopnea, claudication, leg swelling and PND.  Gastrointestinal:  Negative for abdominal pain, heartburn, nausea and vomiting.  Musculoskeletal:  Negative for falls and myalgias.  Skin:  Negative for rash.  Neurological:  Negative for dizziness, tingling, tremors, sensory change, speech change, focal weakness, loss of consciousness and weakness.  Endo/Heme/Allergies:  Does not bruise/bleed easily.  Psychiatric/Behavioral:  Negative for substance abuse. The patient is not nervous/anxious.   All other systems reviewed and are negative.     Physical Exam/Data:   Vitals:   06/09/24 0300 06/09/24 0400 06/09/24 0500 06/09/24 0600  BP: 113/64 138/79 (!) 165/72 (!) 189/73  Pulse: 61 61 64 (!) 59  Resp: 15 16 16 14   Temp:   97.8 F (36.6 C)   TempSrc:   Oral   SpO2: 95% 95% 97% 97%  Weight:      Height:        Intake/Output Summary (Last 24 hours) at 06/09/2024 0743 Last data filed at 06/09/2024 0653 Gross per 24 hour  Intake 576.44 ml  Output --  Net 576.44 ml   Filed Weights   06/08/24 1800  Weight: 64.4 kg   Body mass index is 25.15 kg/m.   Physical Exam: General: Well developed, well nourished, in no acute distress. Head: Normocephalic, atraumatic, sclera non-icteric, no xanthomas, nares without discharge.  Neck: Negative for carotid bruits. JVD not elevated. Lungs: Clear  bilaterally to auscultation without wheezes, rales, or rhonchi. Breathing is unlabored. Heart: RRR with S1 S2.  I/VI systolic murmur RUSB, no rubs, or gallops appreciated. Abdomen: Soft, non-tender, non-distended with normoactive bowel sounds. No hepatomegaly. No rebound/guarding. No obvious abdominal masses. Msk:  Strength and tone appear normal for age. Extremities: No clubbing or cyanosis. No edema. Distal pedal pulses are 2+ and equal bilaterally. Neuro: Alert and oriented X 3. No facial asymmetry. No focal deficit. Moves all extremities spontaneously. Psych:  Responds to questions appropriately with a normal affect.   EKG:  The EKG was personally reviewed and demonstrates: NSR, 75 bpm, no acute ST-T changes Telemetry:  Telemetry was personally reviewed and demonstrates: SR, 60s to 70s bpm  Weights: Filed Weights   06/08/24 1800  Weight: 64.4 kg    Relevant CV Studies:  None available for review  Laboratory Data:  Chemistry Recent Labs  Lab 06/08/24 1802 06/09/24 0409  NA 139 138  K 4.6 4.1  CL 104 104  CO2 23 23  GLUCOSE 109* 129*  BUN 22 21  CREATININE 1.22* 1.06*  CALCIUM  10.1 9.1  GFRNONAA 44* 52*  ANIONGAP 12 11    No results for input(s): PROT, ALBUMIN, AST, ALT, ALKPHOS, BILITOT in the last 168 hours. Hematology Recent Labs  Lab 06/08/24 1802 06/09/24 0409  WBC 7.3 6.5  RBC 4.16 3.74*  HGB 13.0 11.6*  HCT 40.0 34.9*  MCV 96.2 93.3  MCH 31.3 31.0  MCHC 32.5 33.2  RDW 13.8 13.8  PLT 190 165   Cardiac EnzymesNo results for input(s): TROPONINI  in the last 168 hours. No results for input(s): TROPIPOC in the last 168 hours.  BNPNo results for input(s): BNP, PROBNP in the last 168 hours.  DDimer No results for input(s): DDIMER in the last 168 hours.  Radiology/Studies:  CT Angio Chest PE W and/or Wo Contrast Result Date: 06/08/2024 IMPRESSION: 1. No pulmonary embolism. 2. Stable moderate to large volume hiatal hernia.  Electronically signed by: Morgane Naveau MD 06/08/2024 10:57 PM EST RP Workstation: [id number]   DG Chest 2 View Result Date: 06/08/2024 IMPRESSION: 1. No acute findings. 2. Moderate hiatal hernia. Electronically signed by: Elsie Gravely MD 06/08/2024 06:28 PM EST RP Workstation: [id number]    Assessment and Plan:   1. Coronary calcification with precordial pain and elevated high-sensitivity troponin:  - Currently, continues to note sharp left-sided chest discomfort that radiates to the left side of the neck and into the left shoulder that is worse when laying down and improved when sitting up - EKG is without diffuse ST elevation or PR depression - Uncertain etiology of chest pain at this time as ischemic heart disease and potential myopericarditis potential etiologies based on history and presentation - Mildly elevated and flat trending high-sensitivity troponin not consistent with ACS  - Repeat EKG and high-sensitivity troponin with ongoing chest pain this morning - Defer initiation of heparin drip without dynamic troponin elevation  - Obtain echo for further risk stratification with further recommendations regarding type and timing of ischemic evaluation pending results  - Given inability to pursue further cardiac testing at this time, patient may eat from a cardiac perspective - Aspirin  81 mg  - PTA pravastatin  40 mg (LDL 51)   2. HTN:  - Blood pressure largely suboptimally controlled - Possibly contributing to overall presentation - Has PTA verapamil  and HCTZ ordered to be given this morning - Escalate pharmacotherapy as indicated     For questions or updates, please contact [facility] Please consult [email or web address] for contact info under Cardiology/STEMI.   Signed, Bernardino Bring, PA-C  [facility] Pager: [phone] 06/09/2024, 7:43 AM        [1] No Known Allergies  "

## 2024-06-09 NOTE — ED Notes (Signed)
 Messaged provider for diet order.

## 2024-06-09 NOTE — ED Notes (Signed)
 Sent med message for 8am amaryl .

## 2024-06-09 NOTE — ED Notes (Signed)
 Messaged pharmacy to retime missing Amaryl .

## 2024-06-09 NOTE — Evaluation (Signed)
 Physical Therapy Evaluation Patient Details Name: EMALEE KNIES MRN: [id number] DOB: 1941/04/15 Today's Date: 06/09/2024  History of Present Illness  Pt admitted to [facility] on 06/08/24 under observation for c/o atypical chest pain, radiating into upper back that is worse with inspiration. Imaging negative for acute abnormality. Troponin elevated due to demand ischemia. Significant PMH includes: depression, colon CA, HTN, kidney stones, hypothyroidism, T2DM.   Clinical Impression  Pt received in supine with daughter at bedside; they are agreeable for PT eval. At baseline, pt is IND with ADL's, IADL's, ambulation without AD, medication management, and working full time; daughter assists with transportation.  Pt currently at baseline and has not experienced a decline in function as a result of this hospital admission. They are mod I for bed mobility, supervision for transfers, and supervision to ambulate 8ft without AD. Pt able to be mod I with mobility but was provided supervision for safety and line management. Pt notes that her mobility is baseline. VSS throughout session, chest pain noted with deep inspiration.  Due to pt being at baseline without acute deficits, they are not appropriate for skilled acute PT services at this time. PT to sign off. Please re-consult as appropriate.  Encourage OOB mobility with nursing and mobility tech for meals and toileting for continued progress towards goals and maintenance of IND with functional mobility while hospitalized.         If plan is discharge home, recommend the following: A little help with bathing/dressing/bathroom;Help with stairs or ramp for entrance;Assist for transportation;Assistance with cooking/housework   Can travel by private vehicle    Yes    Equipment Recommendations  3in1     Functional Status Assessment Patient has not had a recent decline in their functional status     Precautions / Restrictions Precautions Precautions:  Fall Precaution/Restrictions Comments: DNR, BP goal <130/80, SpO2 ./= 92%      Mobility  Bed Mobility Overal bed mobility: Modified Independent             General bed mobility comments: HOB elevated    Transfers                   General transfer comment: supervision for safety for STS at EOB without AD, increased time/effort and reliance on momentum to faciliate transfer with posterior BLE bracing on bed for stability. demo's fair eccentric lowering with proper hand placement    Ambulation/Gait   Gait Distance (Feet): 60 Feet           General Gait Details: supervision for safety to ambulate in hallway without AD, demo's slowed cadence, decreased step length/foot clearance bil, and increased time for turns with mild unsteadiness. pt notes gait is near baseline.    Balance Overall balance assessment: Modified Independent (able to reach outside BOS to open door and adjust bedding, no LOB)                                           Pertinent Vitals/Pain Pain Assessment Pain Assessment: 0-10 Pain Score: 8  Pain Location: chest pain with inspiration Pain Descriptors / Indicators: Sharp Pain Intervention(s): Monitored during session, Repositioned    Home Living Family/patient expects to be discharged to:: Private residence Living Arrangements: Alone Available Help at Discharge: Family;Available PRN/intermittently Type of Home: House Home Access: Stairs to enter Entrance Stairs-Rails: Left Entrance Stairs-Number of Steps: 4   Home  Layout: One level Home Equipment: Grab bars - tub/shower;Hand held shower head      Prior Function Prior Level of Function : Working/employed             Mobility Comments: IND amb without AD, denies hx of falls ADLs Comments: IND ADL's, IADL's, medication management; daughter assists with transportation     Extremity/Trunk Assessment   Upper Extremity Assessment Upper Extremity Assessment:  Overall WFL for tasks assessed    Lower Extremity Assessment Lower Extremity Assessment: Overall WFL for tasks assessed       Communication   Communication Communication: No apparent difficulties    Cognition Arousal: Alert Behavior During Therapy: WFL for tasks assessed/performed   PT - Cognitive impairments: No apparent impairments                         Following commands: Intact       Cueing Cueing Techniques: Verbal cues, Tactile cues, Visual cues        Exercises Other Exercises Other Exercises: Pt and daughter edu re: PT role/POC, DC recs, DME use for energy conservation, safety with functional mobility, chest pain with inspiration and taking smaller/shorter breaths, benefits of OPPT for maintenance of IND as pt till works.   Assessment/Plan    PT Assessment All further PT needs can be met in the next venue of care  PT Problem List Decreased balance;Decreased activity tolerance           PT Goals (Current goals can be found in the Care Plan section)  Acute Rehab PT Goals Patient Stated Goal: go home PT Goal Formulation: With patient/family Time For Goal Achievement: 06/23/24 Potential to Achieve Goals: Good    Frequency  Eval only        AM-PAC PT 6 Clicks Mobility  Outcome Measure Help needed turning from your back to your side while in a flat bed without using bedrails?: None Help needed moving from lying on your back to sitting on the side of a flat bed without using bedrails?: None Help needed moving to and from a bed to a chair (including a wheelchair)?: A Little Help needed standing up from a chair using your arms (e.g., wheelchair or bedside chair)?: A Little Help needed to walk in hospital room?: A Little Help needed climbing 3-5 steps with a railing? : A Little 6 Click Score: 20    End of Session Equipment Utilized During Treatment: Gait belt Activity Tolerance: Patient tolerated treatment well Patient left: in bed;with  call bell/phone within reach;with family/visitor present Nurse Communication: Mobility status PT Visit Diagnosis: Unsteadiness on feet (R26.81)    Time: 8893-8864 PT Time Calculation (min) (ACUTE ONLY): 29 min   Charges:   PT Evaluation $PT Eval Moderate Complexity: 1 Mod   PT General Charges $$ ACUTE PT VISIT: 1 Visit         Camie CHARLENA Kluver, PT, DPT 12:46 PM,06/09/24 Physical Therapist - [facility]

## 2024-06-09 NOTE — Discharge Instructions (Signed)
 Take medications as prescribed Follow up with PCP within one week

## 2024-06-09 NOTE — ED Notes (Signed)
 Hospitalist at bedside

## 2024-06-09 NOTE — Progress Notes (Signed)
 Echocardiogram 2D Echocardiogram has been performed.  Ethne Jeon N Ravon Mortellaro,RDCS 06/09/2024, 9:23 AM

## 2024-06-09 NOTE — Discharge Summary (Signed)
 " [facility] Phys[facility] Discharge Summary   Patient name: Tammy Cannon  Admit date:     06/08/2024  Discharge date: 06/09/2024  Attending Physician: LAWENCE MADISON LABOR [[id number]]  Discharge Physician: Norval Bar   PCP: Epifanio Alm SQUIBB, MD  Admitted From: Home  Disposition:  Home  Recommendations for Outpatient Follow-up:  Follow up with PCP in 1-2 weeks Monitor for blood pressure control Monitor for chest pain symptoms  Home Health:No Equipment/Devices: @ECDMELIST @  Discharge Condition:Stable CODE STATUS:DNR/DNI Diet recommendation: Heart Healthy/Diabetic Fluid Restriction: None  Hospital Summary:  84 y.o. female with medical history significant for GERD, hypothyroidism, type 2 diabetes mellitus and essential hypertension, who presented to the emergency room with acute onset of midsternal chest pain graded 9/10 in severity and felt as a sharp pain radiating to her back with associated nausea without vomiting.  She admitted to dyspnea and palpitations with her chest pain.  No cough or wheezing or hemoptysis.  No leg pain or edema or recent travels or surgeries.  No fever or chills.   Admitted for atypical chest pain. EKG unremarkable. Troponins remain flat. TTE with no WMAs, preserved LVEF, diastolic dysfunction. CTA chest negative for PT but reported moderate to large hiatal hernia. Inflammatory markers not elevated. No concern for acute pericarditis. Seen by cardiology this admission. Patient reports pain is worse with deep breathing and coughing. Reports she may have lifted some wood to put in the stove near the time when the pain first started to get worse. Reports no association with meals but did have recent large meal that made the pain worse.   - increased home pantoprazole  to 40mg  BID - started TUMS PRN - started maalox PRN - started voltaren  gel PRN - advised to follow up with PCP within one week  Seen by PT during admission due to some reports of chronic dizziness  without falls. Given script for outpatient PT and 3-in-1 bedside commode.  Discharge Diagnoses:  Principal Problem:   Atypical chest pain Active Problems:   Type 2 diabetes mellitus without complications (HCC)   Essential hypertension   Hypothyroidism   Anxiety   Hypertensive emergency   Demand ischemia (HCC)   Hiatal hernia   Pure hypercholesterolemia   Discharge Instructions  Discharge Instructions     Ambulatory referral to Physical Therapy   Complete by: As directed    Increase activity slowly   Complete by: As directed       Allergies as of 06/09/2024   No Known Allergies      Medication List     STOP taking these medications    ALPRAZolam  0.5 MG tablet Commonly known as: XANAX    hydrochlorothiazide  12.5 MG capsule Commonly known as: MICROZIDE    linaclotide  145 MCG Caps capsule Commonly known as: LINZESS    losartan 100 MG tablet Commonly known as: COZAAR   PARoxetine  10 MG tablet Commonly known as: PAXIL    trandolapril  4 MG tablet Commonly known as: MAVIK    verapamil  240 MG 24 hr capsule Commonly known as: VERELAN        TAKE these medications    alum & mag hydroxide-simeth 200-200-20 MG/5ML suspension Commonly known as: MAALOX/MYLANTA Take 15 mLs by mouth every 4 (four) hours as needed for indigestion or heartburn.   amLODipine  5 MG tablet Commonly known as: NORVASC  Take 1 tablet (5 mg total) by mouth daily. Start taking on: June 10, 2024   aspirin  EC 81 MG tablet Take 81 mg by mouth daily as needed (heart murmur). Swallow whole.  benzonatate  100 MG capsule Commonly known as: Tessalon  Perles Take 1 capsule (100 mg total) by mouth 3 (three) times daily as needed for cough.   calcium  carbonate 500 MG chewable tablet Commonly known as: TUMS - dosed in mg elemental calcium  Chew 1 tablet (200 mg of elemental calcium  total) by mouth 3 times/day as needed-between meals & bedtime for heartburn.   diclofenac  Sodium 1 % Gel Commonly  known as: VOLTAREN  Apply 2 g topically 4 (four) times daily.   glimepiride  2 MG tablet Commonly known as: AMARYL  Take 1 mg by mouth daily with breakfast.   irbesartan  300 MG tablet Commonly known as: AVAPRO  Take 1 tablet (300 mg total) by mouth daily.   levothyroxine  75 MCG tablet Commonly known as: SYNTHROID  Take 1 tablet (75 mcg total) by mouth daily.   pantoprazole  40 MG tablet Commonly known as: PROTONIX  Take 1 tablet (40 mg total) by mouth 2 (two) times daily. What changed: when to take this   pravastatin  40 MG tablet Commonly known as: PRAVACHOL  Take 40 mg by mouth at bedtime.   traZODone  50 MG tablet Commonly known as: DESYREL  Take 50 mg by mouth at bedtime.               Durable Medical Equipment  (From admission, onward)           Start     Ordered   06/09/24 1259  DME 3-in-1  Once        06/09/24 1300            Allergies[1]  Discharge Exam: Vitals:   06/09/24 1000 06/09/24 1100  BP: (!) 164/77 (!) 140/66  Pulse: 77 70  Resp:  20  Temp:    SpO2: 97% 96%    Physical Exam Vitals and nursing note reviewed.  Constitutional:      General: She is not in acute distress.    Appearance: She is not ill-appearing.     Comments: frail  HENT:     Head: Normocephalic and atraumatic.  Cardiovascular:     Rate and Rhythm: Normal rate and regular rhythm.     Pulses: Normal pulses.     Heart sounds: Normal heart sounds.  Pulmonary:     Effort: Pulmonary effort is normal.     Breath sounds: Normal breath sounds.  Abdominal:     General: Bowel sounds are normal.     Palpations: Abdomen is soft.  Neurological:     Mental Status: She is alert.     The results of significant diagnostics from this hospitalization (including imaging, microbiology, ancillary and laboratory) are listed below for reference.    Microbiology: No results found for this or any previous visit (from the past 240 hours).   Labs: ProBNP, BNP (last 5 results) No  results for input(s): PROBNP, BNP in the last 8760 hours. Basic Metabolic Panel: Recent Labs  Lab 06/08/24 1802 06/09/24 0409  NA 139 138  K 4.6 4.1  CL 104 104  CO2 23 23  GLUCOSE 109* 129*  BUN 22 21  CREATININE 1.22* 1.06*  CALCIUM  10.1 9.1   Liver Function Tests: No results for input(s): AST, ALT, ALKPHOS, BILITOT, PROT, ALBUMIN in the last 168 hours. No results for input(s): LIPASE, AMYLASE in the last 168 hours. No results for input(s): AMMONIA in the last 168 hours. CBC: Recent Labs  Lab 06/08/24 1802 06/09/24 0409  WBC 7.3 6.5  HGB 13.0 11.6*  HCT 40.0 34.9*  MCV 96.2 93.3  PLT  190 165   Cardiac Enzymes: No results for input(s): CKTOTAL, CKMB, CKMBINDEX, TROPONINI, TROPONINIHS in the last 168 hours. BNP: No results for input(s): BNP in the last 168 hours. CBG: No results for input(s): GLUCAP in the last 168 hours. D-Dimer No results for input(s): DDIMER in the last 72 hours. Hgb A1c No results for input(s): HGBA1C in the last 72 hours. Lipid Profile No results for input(s): CHOL, HDL, LDLCALC, TRIG, CHOLHDL, LDLDIRECT in the last 72 hours. Thyroid  function studies No results for input(s): TSH, T4TOTAL, FREET4, T3FREE, THYROIDAB in the last 72 hours.  Invalid input(s): FREET3 Anemia work up No results for input(s): VITAMINB12, FOLATE, FERRITIN, TIBC, IRON , RETICCTPCT in the last 72 hours. Urinalysis    Component Value Date/Time   COLORURINE YELLOW (A) 07/14/2019 1312   APPEARANCEUR HAZY (A) 07/14/2019 1312   LABSPEC 1.018 07/14/2019 1312   PHURINE 5.0 07/14/2019 1312   GLUCOSEU NEGATIVE 07/14/2019 1312   HGBUR NEGATIVE 07/14/2019 1312   BILIRUBINUR negative 02/20/2023 1517   KETONESUR negative 02/20/2023 1517   KETONESUR NEGATIVE 07/14/2019 1312   PROTEINUR =30 (A) 02/20/2023 1517   PROTEINUR NEGATIVE 07/14/2019 1312   UROBILINOGEN 1.0 02/20/2023 1517   UROBILINOGEN >8.0  (H) 06/08/2008 0023   NITRITE Negative 02/20/2023 1517   NITRITE NEGATIVE 07/14/2019 1312   LEUKOCYTESUR Negative 02/20/2023 1517   LEUKOCYTESUR NEGATIVE 07/14/2019 1312   Sepsis Labs Recent Labs  Lab 06/08/24 1802 06/09/24 0409  WBC 7.3 6.5    Procedures/Studies: ECHOCARDIOGRAM COMPLETE Result Date: 06/09/2024    ECHOCARDIOGRAM REPORT   Patient Name:   AUSET FRITZLER Date of Exam: 06/09/2024 Medical Rec #:  [id number]     Height:       63.0 in Accession #:    [id number]    Weight:       142.0 lb Date of Birth:  Aug 02, 1940     BSA:          1.672 m Patient Age:    83 years      BP:           146/62 mmHg Patient Gender: F             HR:           88 bpm. Exam Location:  [facility] Procedure: 2D Echo, 3D Echo, Cardiac Doppler, Color Doppler and Strain Analysis            (Both Spectral and Color Flow Doppler were utilized during            procedure). Indications:     Chest Pain  History:         Patient has no prior history of Echocardiogram examinations.                  Cardiomegaly, CAD, Signs/Symptoms:Dyspnea and Murmur; Risk                  Factors:Diabetes and Hypothyroidism. Breast Cancer, Chronic                  Kidney Disease.  Sonographer:     Logan Shove RDCS Referring Phys:  [id number] NORVAL BAR Diagnosing Phys: Annabella Scarce MD IMPRESSIONS  1. Left ventricular ejection fraction, by estimation, is 60 to 65%. Left ventricular ejection fraction by 3D volume is 62 %. The left ventricle has normal function. The left ventricle has no regional wall motion abnormalities. Left ventricular diastolic  parameters are consistent with Grade I diastolic dysfunction (impaired relaxation). The average  left ventricular global longitudinal strain is -25.4 %. The global longitudinal strain is normal.  2. Right ventricular systolic function is normal. The right ventricular size is normal. There is normal pulmonary artery systolic pressure.  3. Left atrial size was mildly dilated.  4. The mitral valve is normal in  structure. Trivial mitral valve regurgitation. No evidence of mitral stenosis.  5. The aortic valve is normal in structure. Aortic valve regurgitation is not visualized. No aortic stenosis is present.  6. The inferior vena cava is normal in size with greater than 50% respiratory variability, suggesting right atrial pressure of 3 mmHg. FINDINGS  Left Ventricle: Left ventricular ejection fraction, by estimation, is 60 to 65%. Left ventricular ejection fraction by 3D volume is 62 %. The left ventricle has normal function. The left ventricle has no regional wall motion abnormalities. The average left ventricular global longitudinal strain is -25.4 %. Strain was performed and the global longitudinal strain is normal. The left ventricular internal cavity size was normal in size. There is no left ventricular hypertrophy. Left ventricular diastolic parameters are consistent with Grade I diastolic dysfunction (impaired relaxation). Indeterminate filling pressures. Right Ventricle: The right ventricular size is normal. No increase in right ventricular wall thickness. Right ventricular systolic function is normal. There is normal pulmonary artery systolic pressure. The tricuspid regurgitant velocity is 2.82 m/s, and  with an assumed right atrial pressure of 3 mmHg, the estimated right ventricular systolic pressure is 34.8 mmHg. Left Atrium: Left atrial size was mildly dilated. Right Atrium: Right atrial size was normal in size. Pericardium: There is no evidence of pericardial effusion. Mitral Valve: The mitral valve is normal in structure. Trivial mitral valve regurgitation. No evidence of mitral valve stenosis. Tricuspid Valve: The tricuspid valve is normal in structure. Tricuspid valve regurgitation is mild . No evidence of tricuspid stenosis. Aortic Valve: The aortic valve is normal in structure. Aortic valve regurgitation is not visualized. No aortic stenosis is present. Aortic valve peak gradient measures 8.5 mmHg.  Pulmonic Valve: The pulmonic valve was normal in structure. Pulmonic valve regurgitation is trivial. No evidence of pulmonic stenosis. Aorta: The aortic root is normal in size and structure. Venous: The inferior vena cava is normal in size with greater than 50% respiratory variability, suggesting right atrial pressure of 3 mmHg. IAS/Shunts: No atrial level shunt detected by color flow Doppler. Additional Comments: 3D was performed not requiring image post processing on an independent workstation and was normal.  LEFT VENTRICLE PLAX 2D LVIDd:         3.60 cm         Diastology LVIDs:         1.70 cm         LV e' medial:    6.85 cm/s LV PW:         1.10 cm         LV E/e' medial:  12.8 LV IVS:        0.90 cm         LV e' lateral:   7.40 cm/s LVOT diam:     1.80 cm         LV E/e' lateral: 11.8 LVOT Area:     2.54 cm LV IVRT:       116 msec        2D Longitudinal  Strain                                2D Strain GLS   -24.7 %                                (A4C):                                2D Strain GLS   -24.8 %                                (A3C):                                2D Strain GLS   -26.5 %                                (A2C):                                2D Strain GLS   -25.4 %                                Avg:                                 3D Volume EF                                LV 3D EF:    Left                                             ventricul                                             ar                                             ejection                                             fraction                                             by 3D  volume is                                             62 %.                                 3D Volume EF:                                3D EF:        62 %                                LV EDV:       115 ml                                LV ESV:       43 ml                                 LV SV:        71 ml RIGHT VENTRICLE             IVC RV Basal diam:  3.20 cm     IVC diam: 0.80 cm RV S prime:     17.30 cm/s TAPSE (M-mode): 1.9 cm      PULMONARY VEINS                             Diastolic Velocity: 52.90 cm/s                             S/D Velocity:       1.30                             Systolic Velocity:  70.20 cm/s LEFT ATRIUM             Index        RIGHT ATRIUM           Index LA diam:        4.20 cm 2.51 cm/m   RA Area:     17.40 cm LA Vol (A2C):   58.4 ml 34.94 ml/m  RA Volume:   45.50 ml  27.22 ml/m LA Vol (A4C):   42.7 ml 25.54 ml/m LA Biplane Vol: 50.9 ml 30.45 ml/m  AORTIC VALVE AV Area (Vmax): 2.18 cm AV Vmax:        146.00 cm/s AV Peak Grad:   8.5 mmHg LVOT Vmax:      125.00 cm/s  AORTA Ao Root diam: 2.50 cm Ao Asc diam:  3.00 cm MITRAL VALVE                TRICUSPID VALVE MV Area (PHT): 2.20 cm     TR Peak grad:   31.8 mmHg MV Decel Time: 345 msec     TR Vmax:        282.00 cm/s MV E velocity:  87.50 cm/s MV A velocity: 132.00 cm/s  SHUNTS MV E/A ratio:  0.66         Systemic Diam: 1.80 cm Annabella Scarce MD Electronically signed by Annabella Scarce MD Signature Date/Time: 06/09/2024/9:42:41 AM    Final    CT Angio Chest PE W and/or Wo Contrast Result Date: 06/08/2024 EXAM: CTA of the Chest with contrast for PE 06/08/2024 10:35:03 PM TECHNIQUE: CTA of the chest was performed without and with the administration of 75 mL of iohexol  (OMNIPAQUE ) 350 MG/ML injection. Multiplanar reformatted images are provided for review. MIP images are provided for review. Automated exposure control, iterative reconstruction, and/or weight based adjustment of the mA/kV was utilized to reduce the radiation dose to as low as reasonably achievable. COMPARISON: Comparison from 04/16/2020 CLINICAL HISTORY: Pulmonary embolism (PE) suspected, high prob. Pulmonary embolism suspected, high probability. FINDINGS: PULMONARY ARTERIES: Pulmonary arteries are adequately opacified for  evaluation. No pulmonary embolism. Main pulmonary artery is normal in caliber. MEDIASTINUM: The heart and pericardium demonstrate no acute abnormality. Right main coronary artery calcification. Moderate atherosclerotic plaque. Aorta is normal in caliber. Stable Moderate to large volume hiatal hernia. LYMPH NODES: No mediastinal, hilar or axillary lymphadenopathy. LUNGS AND PLEURA: The lungs are without acute process. No focal consolidation or pulmonary edema. No pleural effusion or pneumothorax. UPPER ABDOMEN: Low-density lesion at the right kidney likely represents a simple renal cyst. Simple renal cysts do not require additional follow-up unless clinically indicated due to signs/symptoms. Partially visualized colon resection. SOFT TISSUES AND BONES: Diffusely decreased bone density. Exaggerated kyphotic curvature centered at the T11 level in the setting of chronic compression anterior wedge compression fracture. Chronic compression fracture of the T2 levels. No acute soft tissue abnormality. Likely prior left mastectomy. IMPRESSION: 1. No pulmonary embolism. 2. Stable moderate to large volume hiatal hernia. Electronically signed by: Morgane Naveau MD 06/08/2024 10:57 PM EST RP Workstation: [id number]   DG Chest 2 View Result Date: 06/08/2024 EXAM: 2 VIEW(S) XRAY OF THE CHEST 06/08/2024 06:17:00 PM COMPARISON: 01/30/2024 CLINICAL HISTORY: Chest pain. FINDINGS: LUNGS AND PLEURA: No focal pulmonary opacity. No pleural effusion. No pneumothorax. HEART AND MEDIASTINUM: Moderate hiatal hernia. No acute abnormality of the cardiac silhouette. BONES AND SOFT TISSUES: Stable chronic lower thoracic vertebral body compression fracture. Degenerative changes of the spine and shoulders. Surgical clips in the right upper quadrant. IMPRESSION: 1. No acute findings. 2. Moderate hiatal hernia. Electronically signed by: Elsie Gravely MD 06/08/2024 06:28 PM EST RP Workstation: [id number]    Time coordinating discharge: 45  mins  SIGNED:  Norval Bar, MD [facility] 06/09/24, 1:00 PM     [1] No Known Allergies  "

## 2024-06-09 NOTE — Assessment & Plan Note (Signed)
-   The patient will be admitted to an observation cardiac telemetry bed. - Will follow serial troponins and EKGs. - The patient will be placed on aspirin as well as p.r.n. sublingual nitroglycerin and morphine sulfate for pain. - We will obtain a cardiology consult in a.m. for further cardiac risk stratification. - I notified [facility] group about the patient

## 2024-06-09 NOTE — Assessment & Plan Note (Addendum)
-   Will continue Synthroid .

## 2024-06-09 NOTE — ED Notes (Signed)
Pt walking with PT. 

## 2024-06-09 NOTE — Assessment & Plan Note (Signed)
-   Will place the patient on supplemental coverage with NovoLog . - Will continue Amaryl .

## 2024-06-09 NOTE — Assessment & Plan Note (Signed)
-   Will continue Xanax .

## 2024-06-09 NOTE — Assessment & Plan Note (Signed)
-   Will continue antihypertensive therapy.

## 2024-06-09 NOTE — ED Notes (Signed)
Called dietary for breakfast tray.

## 2024-06-10 LAB — C-REACTIVE PROTEIN: CRP: 3.3 mg/dL — ABNORMAL HIGH

## 2024-06-11 ENCOUNTER — Other Ambulatory Visit (HOSPITAL_COMMUNITY): Payer: Self-pay

## 2024-06-13 ENCOUNTER — Telehealth: Payer: Self-pay | Admitting: Physician Assistant

## 2024-06-13 NOTE — Telephone Encounter (Signed)
 Vickie want to talk to Dr. Epifanio before scheduling with us .  For the hospital follow up

## 2024-09-03 ENCOUNTER — Inpatient Hospital Stay

## 2024-09-10 ENCOUNTER — Inpatient Hospital Stay: Admitting: Nurse Practitioner
